# Patient Record
Sex: Male | Born: 1935 | Race: White | Hispanic: No | Marital: Married | Smoking: Former smoker
Health system: Southern US, Community
[De-identification: ages and names within clinical notes are randomized; demographics above are authoritative.]

## PROBLEM LIST (undated history)

## (undated) DIAGNOSIS — K5790 Diverticulosis of intestine, part unspecified, without perforation or abscess without bleeding: Secondary | ICD-10-CM

## (undated) DIAGNOSIS — N400 Enlarged prostate without lower urinary tract symptoms: Secondary | ICD-10-CM

## (undated) DIAGNOSIS — F32A Depression, unspecified: Secondary | ICD-10-CM

## (undated) DIAGNOSIS — I2510 Atherosclerotic heart disease of native coronary artery without angina pectoris: Secondary | ICD-10-CM

## (undated) DIAGNOSIS — N289 Disorder of kidney and ureter, unspecified: Secondary | ICD-10-CM

## (undated) DIAGNOSIS — N139 Obstructive and reflux uropathy, unspecified: Secondary | ICD-10-CM

## (undated) DIAGNOSIS — E119 Type 2 diabetes mellitus without complications: Secondary | ICD-10-CM

## (undated) DIAGNOSIS — K859 Acute pancreatitis without necrosis or infection, unspecified: Secondary | ICD-10-CM

## (undated) DIAGNOSIS — A4152 Sepsis due to Pseudomonas: Secondary | ICD-10-CM

## (undated) DIAGNOSIS — C61 Malignant neoplasm of prostate: Secondary | ICD-10-CM

## (undated) DIAGNOSIS — D649 Anemia, unspecified: Secondary | ICD-10-CM

## (undated) DIAGNOSIS — K222 Esophageal obstruction: Secondary | ICD-10-CM

## (undated) DIAGNOSIS — I10 Essential (primary) hypertension: Secondary | ICD-10-CM

## (undated) DIAGNOSIS — F329 Major depressive disorder, single episode, unspecified: Secondary | ICD-10-CM

## (undated) DIAGNOSIS — K449 Diaphragmatic hernia without obstruction or gangrene: Secondary | ICD-10-CM

## (undated) DIAGNOSIS — I714 Abdominal aortic aneurysm, without rupture, unspecified: Secondary | ICD-10-CM

## (undated) DIAGNOSIS — J45909 Unspecified asthma, uncomplicated: Secondary | ICD-10-CM

## (undated) DIAGNOSIS — I219 Acute myocardial infarction, unspecified: Secondary | ICD-10-CM

## (undated) DIAGNOSIS — E7800 Pure hypercholesterolemia, unspecified: Secondary | ICD-10-CM

## (undated) DIAGNOSIS — K589 Irritable bowel syndrome without diarrhea: Secondary | ICD-10-CM

## (undated) DIAGNOSIS — E039 Hypothyroidism, unspecified: Secondary | ICD-10-CM

## (undated) DIAGNOSIS — K219 Gastro-esophageal reflux disease without esophagitis: Secondary | ICD-10-CM

## (undated) DIAGNOSIS — E538 Deficiency of other specified B group vitamins: Secondary | ICD-10-CM

## (undated) DIAGNOSIS — M1990 Unspecified osteoarthritis, unspecified site: Secondary | ICD-10-CM

## (undated) DIAGNOSIS — K2970 Gastritis, unspecified, without bleeding: Secondary | ICD-10-CM

## (undated) HISTORY — DX: Disorder of kidney and ureter, unspecified: N28.9

## (undated) HISTORY — DX: Abdominal aortic aneurysm, without rupture: I71.4

## (undated) HISTORY — DX: Sepsis due to Pseudomonas: A41.52

## (undated) HISTORY — DX: Acute myocardial infarction, unspecified: I21.9

## (undated) HISTORY — DX: Acute pancreatitis without necrosis or infection, unspecified: K85.90

## (undated) HISTORY — DX: Benign prostatic hyperplasia without lower urinary tract symptoms: N40.0

## (undated) HISTORY — DX: Gastritis, unspecified, without bleeding: K29.70

## (undated) HISTORY — DX: Hypothyroidism, unspecified: E03.9

## (undated) HISTORY — DX: Unspecified osteoarthritis, unspecified site: M19.90

## (undated) HISTORY — DX: Malignant neoplasm of prostate: C61

## (undated) HISTORY — DX: Diverticulosis of intestine, part unspecified, without perforation or abscess without bleeding: K57.90

## (undated) HISTORY — DX: Gastro-esophageal reflux disease without esophagitis: K21.9

## (undated) HISTORY — DX: Atherosclerotic heart disease of native coronary artery without angina pectoris: I25.10

## (undated) HISTORY — DX: Major depressive disorder, single episode, unspecified: F32.9

## (undated) HISTORY — DX: Depression, unspecified: F32.A

## (undated) HISTORY — DX: Irritable bowel syndrome, unspecified: K58.9

## (undated) HISTORY — DX: Essential (primary) hypertension: I10

## (undated) HISTORY — DX: Abdominal aortic aneurysm, without rupture, unspecified: I71.40

## (undated) HISTORY — DX: Type 2 diabetes mellitus without complications: E11.9

## (undated) HISTORY — PX: CORONARY ANGIOPLASTY WITH STENT PLACEMENT: SHX49

## (undated) HISTORY — DX: Diaphragmatic hernia without obstruction or gangrene: K44.9

## (undated) HISTORY — DX: Obstructive and reflux uropathy, unspecified: N13.9

## (undated) HISTORY — DX: Pure hypercholesterolemia, unspecified: E78.00

## (undated) HISTORY — DX: Anemia, unspecified: D64.9

## (undated) HISTORY — DX: Esophageal obstruction: K22.2

## (undated) HISTORY — DX: Deficiency of other specified B group vitamins: E53.8

---

## 1998-09-22 ENCOUNTER — Ambulatory Visit (HOSPITAL_COMMUNITY): Admission: RE | Admit: 1998-09-22 | Discharge: 1998-09-22 | Payer: Self-pay | Admitting: Orthopaedic Surgery

## 1998-11-08 ENCOUNTER — Ambulatory Visit (HOSPITAL_COMMUNITY): Admission: RE | Admit: 1998-11-08 | Discharge: 1998-11-08 | Payer: Self-pay | Admitting: Oncology

## 1998-11-08 ENCOUNTER — Encounter (HOSPITAL_COMMUNITY): Payer: Self-pay | Admitting: Oncology

## 2003-12-09 ENCOUNTER — Ambulatory Visit: Payer: Self-pay | Admitting: Gastroenterology

## 2003-12-13 ENCOUNTER — Ambulatory Visit: Payer: Self-pay | Admitting: Gastroenterology

## 2004-01-05 ENCOUNTER — Ambulatory Visit (HOSPITAL_COMMUNITY): Admission: RE | Admit: 2004-01-05 | Discharge: 2004-01-05 | Payer: Self-pay | Admitting: Internal Medicine

## 2004-01-06 ENCOUNTER — Ambulatory Visit: Payer: Self-pay | Admitting: Gastroenterology

## 2004-01-13 ENCOUNTER — Ambulatory Visit: Payer: Self-pay | Admitting: Gastroenterology

## 2004-01-20 ENCOUNTER — Ambulatory Visit: Payer: Self-pay | Admitting: Gastroenterology

## 2005-07-10 ENCOUNTER — Ambulatory Visit: Payer: Self-pay | Admitting: Gastroenterology

## 2005-07-11 ENCOUNTER — Ambulatory Visit: Payer: Self-pay | Admitting: Gastroenterology

## 2005-07-16 ENCOUNTER — Emergency Department (HOSPITAL_COMMUNITY): Admission: EM | Admit: 2005-07-16 | Discharge: 2005-07-16 | Payer: Self-pay | Admitting: Emergency Medicine

## 2005-07-20 ENCOUNTER — Ambulatory Visit: Payer: Self-pay | Admitting: Emergency Medicine

## 2005-08-07 ENCOUNTER — Ambulatory Visit: Payer: Self-pay | Admitting: Internal Medicine

## 2007-04-02 ENCOUNTER — Inpatient Hospital Stay (HOSPITAL_COMMUNITY): Admission: EM | Admit: 2007-04-02 | Discharge: 2007-04-05 | Payer: Self-pay | Admitting: Emergency Medicine

## 2007-04-08 ENCOUNTER — Encounter: Admission: RE | Admit: 2007-04-08 | Discharge: 2007-04-08 | Payer: Self-pay | Admitting: Cardiology

## 2007-08-07 ENCOUNTER — Inpatient Hospital Stay (HOSPITAL_COMMUNITY): Admission: EM | Admit: 2007-08-07 | Discharge: 2007-08-13 | Payer: Self-pay | Admitting: Emergency Medicine

## 2008-05-18 ENCOUNTER — Ambulatory Visit (HOSPITAL_COMMUNITY): Admission: RE | Admit: 2008-05-18 | Discharge: 2008-05-18 | Payer: Self-pay | Admitting: Urology

## 2008-07-12 ENCOUNTER — Encounter: Admission: RE | Admit: 2008-07-12 | Discharge: 2008-07-12 | Payer: Self-pay | Admitting: Internal Medicine

## 2008-10-01 ENCOUNTER — Encounter: Admission: RE | Admit: 2008-10-01 | Discharge: 2008-10-01 | Payer: Self-pay | Admitting: Internal Medicine

## 2010-02-13 ENCOUNTER — Encounter: Payer: Self-pay | Admitting: Internal Medicine

## 2010-06-06 NOTE — H&P (Signed)
NAME:  CLAUD, GOWAN NO.:  [id number]   MEDICAL RECORD NO.:  [id number]          PATIENT TYPE:  INP   LOCATION:  2034                         FACILITY:  [facility]   PHYSICIAN:  Jake Bathe, MD      DATE OF BIRTH:  12-12-1930   DATE OF ADMISSION:  04/02/2007  DATE OF DISCHARGE:                              HISTORY & PHYSICAL   PRIMARY CARE PHYSICIAN:  Juline Patch, M.D. at [facility].   CHIEF COMPLAINT:  Chest pain consistent with non ST elevation MI.   HISTORY OF PRESENT ILLNESS:  A 77 year old male with no prior history of  coronary artery disease with prior hyperlipidemia; remote tobacco  history, quit 22 years ago with early family history of myocardial  infarction with his father having a MI in his 54s, who tonight after  dinner eating soup, walked over to his television, and noted a burning  in his throat with radiation to his left chest wall with no associated  shortness of breath, nausea, vomiting, but quite significant burning,  discomfort, and pressure.  Lasted approximately half hour, took Rolaids  and Alka-Seltzer, no change.  Called EMS who promptly gave him aspirin  and sent him to [facility] ER for further evaluation.  Here in the  emergency room, he was noted to have marked T-wave inversion in the  precordial leads which are different from his previous EKG.  No ST  elevations.  Currently, he is chest pain free with no shortness of  breath.  Approximately 1 week prior to this episode, he had  a similar  shorter episode lasting approximately 10 minutes in duration.  He is  here accompanied by his wife.   PAST MEDICAL HISTORY:  1. 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  2. Osteoarthritis.  3. Hyperlipidemia, on no meds.  4. Previous right hand surgery.   ALLERGIES:  CODEINE and DEMEROL.   MEDICATIONS:  He takes oxycodone at night for his osteoarthritis,  vitamins, aspirin 325 mg every now and then and is on no  statin  medication.   SOCIAL HISTORY:  Quit tobacco use 25 years ago.  No alcohol use.  He  worked for the [employer], Insurance account manager  roads.  He has been married for 50 years.  His wife's sister was treated  previously by Dr. Tresa Endo.   FAMILY HISTORY:  Father had MI in his 81s.   REVIEW OF SYSTEMS:  No bleeding problems.  No syncope.  No palpitations.  As above.  Unless specified above all other 12-reveiw of systems  negative.   PHYSICAL EXAMINATION:  Pulse currently 77, saturating 97% on room air,  and blood pressure 145/87 to 158/77, originally 170/87.  Respiration 16.  GENERAL:  Alert and oriented x3 in no acute distress, lying in bed,  accompanied by his wife.  EYE:  Well-perfused conjunctivae.  EOMI.  No scleral icterus.  NECK:  No carotid bruits.  No JVD.  Moist mucous membranes.  CARDIOVASCULAR:  Regular rate and rhythm.  Distant heart sounds.  No  murmurs, rubs, or gallops.  LUNGS:  Clear to  auscultation bilaterally.  Mild crackles, resolve with  coughing left lower lobe.  Normal respiratory effort.  ABDOMEN:  Soft and nontender.  Normoactive bowel sounds.  No bruits.  EXTREMITIES:  No clubbing, cyanosis, or edema.  A 2+ femoral pulse.  NEUROLOGIC:  Nonfocal.  No tremors.  SKIN:  Warm, dry, and intact.  No rashes noted.   DATA:  EKG shows marked T-wave inversion suggestive of ischemia in all  of the precordial leads and in lead I and aVL as well as lead II.  No  evidence of ST elevation.  First EKG was demonstrated some biphasic T  waves in V3.  When compared to all these EKG from December 2005, T-wave  abnormalities are not present.  Chest x-ray personally viewed showed no  acute airspace disease.  Nuclear stress test from December 2005, showed  normal ejection fraction of 58% with no ischemia.  Cardiac enzymes point-  of-care marker showed myoglobin greater than 500, MB of 28, troponin of  1.1.  Sodium 137, potassium 4.2, BUN 8, creatinine  1.4, and glucose 309.  White count 6.2, hemoglobin 13, hematocrit 37, and platelets 196,000.  Lipase 22.  Prior medical records reviewed.   ASSESSMENT/PLAN:  A 77 year old male with hyperlipidemia, hyperglycemia  with non-ST elevation myocardial infarction, positive troponin, and T  wave inversions indicative of ischemia on EKG.  1. Non-ST-elevation myocardial infarction - aspirin, beta-blocker,      heparin IV, nitroglycerin p.r.n., serial biomarkers checking EKG in      a.m., and n.p.o.  Plan cardiac catheterization in morning.      Possible percutaneous intervention.  If changes occur overnight,      may sent to catheterization lab emergently.  2. Hyperglycemia - placed on insulin sliding scale, check hemoglobin      A1c.  3. Check fasting lipid profile.  4. Chronic renal insufficiency - creatinine is mildly elevated at 1.4.      We will give Mucomyst and hydrate.  In regards to cardiac      catheterization, he is aware of risks and benefits especially      stroke, heart attack, and  death.      Jake Bathe, MD  Electronically Signed     MCS/MEDQ  D:  04/02/2007  T:  04/03/2007  Job:  [id number]   cc:   Juline Patch, M.D.

## 2010-06-06 NOTE — Cardiovascular Report (Signed)
NAME:  Paul Odonnell, Paul Odonnell NO.:  [id number]   MEDICAL RECORD NO.:  [id number]          PATIENT TYPE:  INP   LOCATION:  2916                         FACILITY:  [facility]   PHYSICIAN:  Lyn Records, M.D.   DATE OF BIRTH:  11-26-1930   DATE OF PROCEDURE:  04/03/2007  DATE OF DISCHARGE:                            CARDIAC CATHETERIZATION   REFERRING PHYSICIAN:  Jake Bathe, MD and Juline Patch, M.D. at  [facility].   INDICATION:  Non-ST-elevation myocardial infarction.   PROCEDURE:  Drug-eluting  Promus stent, mid-LAD.   DESCRIPTION:  Following diagnostic procedure by Dr. Anne Fu, a 6-French  XP-4 guide catheter was inserted and guiding shots obtained.  A Prowater  guidewire was initially attempted to be placed down the vessel but with  difficulty.  We then used a BMW wire to obtain stability within the  vessel and used a fresh Asahi Prowater guidewire to get to the distal  LAD.  We then removed the BMW wire and used a 2.5 x 12-mm long Maverick  balloon.  After predilatation, we properly placed and deployed an 18-mm  long  Promus 0.275 stent.  Two balloon inflations at 10 atmospheres were  performed.  I chose a stent smaller than the largest portion of this  segmental stenosis because I was concerned about producing dissection in  the proximal margin.  After stent deployment, we then used a 3.0 x 15  Quantum within both stent margins up to 15 atmospheres.  I still felt  that the distal margin was not well expanded and we used a 0.325 x 10-mm  Dura Star.  One balloon inflation to 15 atmospheres was performed.  After reviewing flow, the wire was removed and  final images were  obtained.  There is clear disparity in the vessel diameter with the  proximal vessel being a little smaller than 3.0 in diameter.  There is a  clear step-up there.  Distally, within an acute margin  just after the  culprit lesion,  the vessel balloons to 3.3 to 3.5 in diameter.  We  used  a 0.325 high-pressure inflation within the distal stent margin to try to  properly oppose the stent without being overly aggressive in causing  dissection.  TIMI grade 3 flow was noted, less than 10% stenosis was  noted within the stent margin before the distal stent.  Proximally, in  one view, there is a suggestion of very focal region of intimal  disruption, but no dye hang up and a large lumen.  No further ballooning  or stenting was done in this region.   The patient was on  Integrilin when brought to the lab.  He was loaded  with Plavix 600 mg and was given weight-based heparin.  ACT was greater  than 300 during the procedure.  Manual compression will be used for  hemostasis.   CONCLUSION:  Successful drug-eluting stent, mid left anterior  descending, 99% stenosis to 0%.  There is vessel diameter disparity with  the proximal segment being smaller than the distal stent margin which is  right  in an  acute bend.  The final result is reasonable.  I would  recommend that the patient remain on aspirin and Plavix combination for  at least 24 months.   PLAN:  Discontinue  Integrilin after 18 hours.  Further management per  Dr. Anne Fu.     Lyn Records, M.D.  Electronically Signed    HWS/MEDQ  D:  04/03/2007  T:  04/04/2007  Job:  [id number]

## 2010-06-06 NOTE — Cardiovascular Report (Signed)
NAME:  Paul Odonnell, Paul Odonnell NO.:  [id number]   MEDICAL RECORD NO.:  [id number]          PATIENT TYPE:  INP   LOCATION:  2916                         FACILITY:  [facility]   PHYSICIAN:  Jake Bathe, MD      DATE OF BIRTH:  07/19/1930   DATE OF PROCEDURE:  04/03/2007  DATE OF DISCHARGE:                            CARDIAC CATHETERIZATION   PROCEDURES:  1. Left heart catheterization.  2. Selective coronary angiography.  3. Left ventriculogram.   INDICATIONS:  A 77 year old male with non-ST elevation MI.   PROCEDURE IN DETAILS:  Informed consent was obtained.  Risk of stroke,  heart attack, death, and renal impairment were explained to the patient  and his wife.  He was placed on the catheterization table, prepped in a  sterile fashion.  Using the modified Seldinger technique, a 6-French  sheath was placed in the right femoral artery after visualizing the  femoral head under fluoroscopic guidance.  A Judkins right #4 catheter  was then used to selectively cannulate the right coronary artery and  multiple views of Omnipaque were obtained.  This catheter was then  exchanged for Judkins left #4 catheter to selectively cannulate the left  main artery and multiple views of Omnipaque were obtained.  This  catheter was exchanged for anangled pigtail which was used to cross the  aortic valve into the left ventricle.  Hemodynamics were obtained.  In  the RAO position, a left ventriculogram was performed with power  injection of 30 mL of dye.  Aortic valve pullback was then performed.  After procedure, findings were discussed with Dr. Katrinka Blazing who proceeded  with drug-eluting stent placement to LAD.   FINDINGS:  1. Left main artery - No disease.  Branches into circumflex and LAD.  2. Left anterior descending artery - This vessel has 2 diagonals, the      first of which is moderate in size and has a 75% ostial stenosis as      well as a 50% sequential proximal stenosis.  Distal to  this      diagonal branch, there is a diffusely diseased segment of the LAD,      50% to 60% which then culminates with a 99% stenosis with hazy      opacity suggestive of thrombus.  The LAD then continues to give off      a second diagonal branch and continues to wrap around the apex.      Distally, the LAD has some diffuse disease in the 50% range before      it reaches the apex.  The second diagonal is small in caliber.      There is proximal calcification of the LAD.  3. There is a moderate sized high OM/ramus branch with mild      irregularities.  4. Circumflex.  The remaining circumflex artery is moderate in size,      small AV groove circumflex, small obtuse marginal with no disease.  5. Right coronary artery - The right coronary artery has a 50%      proximal lesion, quite tortuous.  There  are several posterolateral      branches.  PDA arises from this artery, this is the dominant      vessel.  After the first posterolateral branch, there is a 50%      stenosis in the continuation of the large caliber right coronary      artery.  There is also some minor irregularity distally in this      artery.  6. Left ventriculogram, ejection fraction approximately 45% to 50%.      There is distal anterior wall and distal inferior wall      akinesis/dyskinesis.  No thrombus.  7. Hemodynamics.  Left ventricle pressure is 144 systolic with an      LVEDP of 9 mmHg.  The aortic pressure was 144/69 with a mean of 98      mmHg.  There was no gradient.   IMPRESSION:  1. A 99% mid left anterior descending artery stenosis with moderate-      sized length of disease proximally to this.  In addition, moderate      disease in the right coronary artery 50% proximal and distal.  2. Left ventricular ejection fraction 45% to 50% with distal anterior      and distal inferior apical wall akinesis/dyskinesis.  Normal left      ventricular end-diastolic pressure.   RECOMMENDATIONS:  Discussed the films with  Dr. Katrinka Blazing who will perform  drug-eluting stent placement to mid LAD segment.      Jake Bathe, MD  Electronically Signed     MCS/MEDQ  D:  04/03/2007  T:  04/04/2007  Job:  [id number]   cc:   Juline Patch, M.D.

## 2010-06-06 NOTE — Discharge Summary (Signed)
NAME:  Paul Odonnell, Paul Odonnell NO.:  [id number]   MEDICAL RECORD NO.:  [id number]          PATIENT TYPE:  INP   LOCATION:  1410                         [facility]ACILITY:  [facility]   PHYSICIAN:  Herbie Saxon, MDDATE OF BIRTH:  17-Oct-1930   DATE OF ADMISSION:  08/07/2007  DATE OF DISCHARGE:                               DISCHARGE SUMMARY   DATE OF DISCHARGE:  To be determined.   Medications to be dictated on final discharge.   DISCHARGE DIAGNOSES:  1. Sepsis.  2. Left lower lobe pneumonia.  3. Pseudomonas pyelonephritis.  4. Colitis.  5. Diabetes.  6. Hypokalemia supplementation.  7. Anemia of chronic disease.  8. Small pulmonary nodule.  9. Thrombocytopenia.  10.History of coronary artery disease, status post stent.   PRIMARY CARE PHYSICIAN:  Dr. Ricki Miller.   CARDIOLOGY:  Dr. Graciela Husbands.   RADIOLOGY:  A CT of the abdomen and pelvis on August 08, 2007 showed wall  thickening along the distal distending colon and rectosigmoid colon  showed evidence of colitis, suspicious  findings of bilateral  perinephric spot, possibly pyelonephritis.  No evidence of bowel  obstruction or intra-abdominal abscess, and there is at the 4-mm  pulmonary nodule in the right middle lobe.  Repeat CT chest recommended  within the year.  Chest x-ray of August 07, 2007 to the bottom lobe of the  left lung base with peribronchovascular thickening, query bronchial  pneumonia as reason for this.   HOSPITAL COURSE:  This 77 year old male presented to the emergency room  with 2 days of fever, chills, dysuria, urinary frequency, cough,  nonproductive.  He saw his primary physician the day prior to  presentation because of difficulty voiding completely.  He had a high-  grade fever with a temperature of 105.4.  The patient was started on IV  Rocephin and Azithromycin.  The urine culture came back positive for  pseudomonas.  The antibiotic coveragehas been changed to IV Cipro and  p.o. Flagyl.  Abdominal pain  is much reduced .Afebrile in the last 48  hours.  The leukocytosisis resolved.  Hemoccult was negative, but he had  to be transfused 2 units of packed red blood cells on August 10, 2007.  The hematocrit was 31.  Prior to transfusion hematocrit was 24.  The  patient is going to have a bladder scan today and reinsertion of his  Foley catheterization.  Will consider urology evaluation as needed.   PHYSICAL EXAMINATION:  GENERAL:  On examination today he is an elderly  man not in acute distress.  VITAL SIGNS:  Temperature 98, pulse 105, respiratory rate is 18, blood  pressure 107/32.  HEENT:  Pale, not jaundiced.  Head is atraumatic, normocephalic.  Mucous  membranes are moist.  NECK:  Supple.  CHEST:  Clear.  HEART:  Heart sounds 1 and 2, regular, tachycardia.  ABDOMEN:  Benign.  PSYCH:  He is oriented to time, place, and person.  EXTREMITIES:  Peripheral pulses present, no pedal edema.  Power is 4+  globally.  Cranial nerves II-XII are intact.   LABS:  WBC 5.7, hematocrit 31, platelet count 147,  glucose is 78.  The  sodium level is 142, chloride 108, bicarbonate 27, BUN 11, creatinine  1.4.   PLAN:  The plan is to follow up result of blood scan, obtain a PSA level  now.  Will continue urology workup as an outpatient with Dr. Vonita Moss.  Also monitor potassium level.  The patient has had medication therapy  and treatment plan explained to him and his wife did verbalize  understanding.PSA elevated.12.he will need possible prostate biopsy   Final discharge note to be dictated.      Herbie Saxon, MD  Electronically Signed     MIO/MEDQ  D:  08/11/2007  T:  08/11/2007  Job:  [id number]

## 2010-06-06 NOTE — Discharge Summary (Signed)
NAME:  Paul Odonnell, NEEDLE NO.:  [id number]   MEDICAL RECORD NO.:  [id number]          PATIENT TYPE:  INP   LOCATION:  1410                         [facility]AC[facility]   PHYSICIAN:  Hind I Elsaid, MD      DATE OF BIRTH:  1930/04/03   DATE OF ADMISSION:  08/07/2007  DATE OF DISCHARGE:  08/13/2007                               DISCHARGE SUMMARY   PRIMARY CARE PHYSICIAN:  Juline Patch, M.D.   DISCHARGE DIAGNOSES:  1. Pseudomonas pyelonephritis.  2. Left lower lobe pneumonia.  3. Elevated prostatic specific antigen.  4. History of diabetes mellitus type 2.  5. Anemia of chronic disease.  6. Small pulmonary nodule.  7. History of myocardial infarction status post stent.   MEDICATIONS:  1. OxyContin 40 mg nightly.  2. Bisoprolol 5  mg daily.  3. Protonix 20 mg daily.  4. Lasix 10 mg.  5. Plavix 75 mg daily.  6. Aspirin 81 mg daily.  7. Lipitor 40 mg daily.  8. Metformin 500 mg twice daily.  9. Metoprolol 40 mg daily.  The patient is to continue his home      medication.  10.Cipro 500 mg q.12 hours for 5 days.  11.Flomax 0.4 mg twice daily.   CONSULTATIONS:  None.   FOLLOWUP:  Appointment given to see Maretta Bees. Vonita Moss, M.D. on August 6,  at 2:15 p.m. for evaluation of high prostatic specific antigen.   HOSPITAL COURSE:  Problem 1.  This is an add-on to the discharge summary  done by Herbie Saxon, M.D.  The patient had a bladder scan  without evidence of any urinary retention.  The patient has a history of  prostate hypertrophy with workup in the past with Dr. Vonita Moss.  At this  time the patient denies any obstruction and denies any urinary  retention.  The patient will receive Flomax 0.4 mg twice daily and will  ask to continue Cipro for another 5 days.  The patient needs to follow  with Dr. Vonita Moss for evaluation of elevated PSA.   Problem 2.  Anemia.  Hemoglobin remained stable.   Problem 3.  Left lower lobe pneumonia.  Chest x-ray shows  significant  improvement and the patient did not require any form of oxygenation.  The patient will be discharged with Cipro and the patient informed about  the CT scan of his chest results and recommendation to follow up other  CT scan within 1 year.  The patient denies history of smoking.   DISPOSITION:  It was felt that the patient was medically stable to be  discharged home to follow with his primary care, Dr. Ricki Miller, and Dr.  Vonita Moss.      Hind Bosie Helper, MD  Electronically Signed     HIE/MEDQ  D:  08/13/2007  T:  08/13/2007  Job:  [id number]   cc:   Juline Patch, M.D.  Fax: [phone]

## 2010-06-06 NOTE — H&P (Signed)
NAME:  Paul Odonnell, Paul Odonnell NO.:  [id number]   MEDICAL RECORD NO.:  [id number]          PATIENT TYPE:  INP   LOCATION:  0113                         FACILITY:  [facility]   PHYSICIAN:  Herbie Saxon, MDDATE OF BIRTH:  Apr 10, 1930   DATE OF ADMISSION:  08/07/2007  DATE OF DISCHARGE:                              HISTORY & PHYSICAL   He is a full code.  He has no health care proxy assigned.   PRIMARY CARE PHYSICIAN:  Juline Patch, M.D.   PRESENTING COMPLAINT:  A 2-day history of fever, chills, urinary  frequency, dysuria, cough.   HISTORY OF PRESENTING COMPLAINTS:  This 77 year old male was well until  yesterday morning when he started experiencing high grade fever and  chills, dysuria, minimally productive cough, urinary frequency and  urgency.  He presented to his primary care physician yesterday and was  subsequently transferred to the emergency room for suspicion of  prostatitis.  Temperature was 105.4.  The patient denies any hematuria.  He denies any skin rash or joint swelling.  He denies any headache or  neck stiffness.  There is no runny nose, no sore throat. He denies any  wheezing.  He denies any diarrhea, hematemesis or melena in the stools.  There is also no hematuria.   PAST MEDICAL HISTORY:  MI, status post drug-eluting cardiac stenting and  status post cardiac catheterization in March 2009, which was performed  by the cardiologist, Dr. Gennette Pac.   PAST SURGICAL HISTORY:  1. Right hand surgery years ago.  2. Cardiac catheterization in March 2009.   FAMILY HISTORY:  Father and 2 brothers with myocardial infarctions.   SOCIAL HISTORY:  He is married, retired Art gallery manager.  He quit smoking  cigarettes 25 years ago.  No documented history of drug or alcohol  abuse.   REVIEW OF SYSTEMS:  12 systems were reviewed.  Pertinent positives as in  the History of Presenting Complaints.   ALLERGIES:  CODEINE and DEMEROL.   MEDICATIONS:  1. OxyContin 40 mg  q.h.s.  2. Bisodol 5 mg daily.  3. Protonix 20 mg daily.  4. Lasix 10 mg daily.  5. Imdur 30mg  daily  6. Potassium p.r.n.  7. klonopin 0.5 mg q.h.s.  8. Creon 5 tablets daily.  9. Metformin 500 mg daily.  10.Metoprolol 50 mg b.i.d.  11.Aspirin 325 mg daily.  12.Lipitor 40 mg daily.  13.Lisinopril 10 mg daily  14.Plavix 5 mg daily.   PHYSICAL EXAMINATION:  He is an elderly man, acutely ill looking, not in  respiratory distress.  He is pale, not jaundiced.  There is no cyanosis.  I am seeing no clubbing.  Temperature is 103, pulse 102, respirations 20, blood pressure 128/57.  Pupils aer equal, reactive to light and accommodation.  Extraocular  muscles intact.  Mucous membranes are dry.  The oropharynx and the  pharynx are clear.  There is no submandibular lymphadenopathy.  No elevated JVD.  Neck is  supple.  CHEST:  He has coarse rales in the left base.  No chest wall tenderness,  no wheezing.  PMI at the 5th intercostal space at  the midclavicular line.  There is no  parasternal heave, murmurs or gallops.  Heart sounds 1 and 2,  tachycardic, regular.  ABDOMEN:  Soft, nontender, no organomegaly.  Inguinal orifices are  patent.  He is alert and oriented to time, place and person.  Mood is stable.  Speech is  normal.  Cranial nerves II-XII intact.  Power is 5 all limbs.  Deep tendon reflexes are 2+ all limbs.  Sensory system normal.  Peripheral pulse is present.  No pedal edema.   LAB TESTS:  Chemistry:  Sodium is 135, potassium 4.0, chloride 101, BUN  27, creatinine 1.8, glucose is 159.  Complete blood count:  WBC is 9.2,  hematocrit 29, platelet count is 121.  Urinalysis:  Appearance is  cloudy.  Leukocyte esterase large, WBCs 21-50, bacteria rare.  Chest x-  ray shows left lower lobe volume loss and peribronchial vascular  opacity.   ASSESSMENT:  1. Left lower lobe pneumonia.  2. Urinary tract infection.  3. Dehydration.  4. Anemia.  5. Uncontrolled diabetes.  6.  History of myocardial infarction, status post stenting.   PLAN:  1. The patient will get admitted to a telemetry bed.  2. He will be on bedrest and droplet precautions.  3. We will send nasal swab for H1N1 virus.  4. We will obtain an ESR.  5. Oxygen and room air.  6. Mycoplasma serology.  7. Urine Legionella antigen assay x1.  8. We will get serial cardiac enzymes and  EKG q. 8 hours times 3.  9. We will start him on IV Rocephin and Zithromax.  10.We will also obtain a urinalysis, culture and blood cultures times      2.  11.We will obtain his coagulation parameters.  12.He should be on supplemental oxygen 2-4 liters  by nasal cannula  13.Diet should be renal,low cholesterol.  14.We will hold his Lasix and metformin.  We will continue with his      other medications and reduce the dose of his Lisinopril to 5 mg      daily. We will reduce the dose of his OxyContin to 20 mg daily.  15.He is to be on Lovenox 30 mg subcu daily for deep venous thrombosis      prophylaxis and 40 mg IV daily for also prophylaxis.  16.Will put on Atrovent q.6 hours standing and put on Atrovent 1 unit      dose q. 3 hours p.r.n.  17.Tylenol 600  mg q. 4 hours p.r.n. for fever.  18.We will continue to monitor  the renal function      Herbie Saxon, MD  Electronically Signed     MIO/MEDQ  D:  08/07/2007  T:  08/07/2007  Job:  [id number]   cc:   Juline Patch, M.D.  Fax: [phone]   Dr. Gennette Pac

## 2010-06-09 NOTE — Discharge Summary (Signed)
NAME:  Paul Odonnell, TRICE NO.:  [id number]   MEDICAL RECORD NO.:  [id number]          PATIENT TYPE:  INP   LOCATION:  4711                         FACILITY:  [facility]   PHYSICIAN:  Jake Bathe, MD      DATE OF BIRTH:  1931/01/21   DATE OF ADMISSION:  04/02/2007  DATE OF DISCHARGE:  04/05/2007                               DISCHARGE SUMMARY   DISCHARGE DIAGNOSES:  1. Non-ST-segment elevated myocardial infarction with a maximum      troponin of 37.94.  2. Dyslipidemia, treated.  3. Hypertension, treated.  4. Diabetes mellitus, treated.  5. Left ventricular systolic dysfunction, EF 45%-50%, medically      managed.   HOSPITAL COURSE:  [name] is a 77 year old male patient who is  admitted to [facility] of acute left anterior chest wall pain.  He  took Rolaids and Alka-Seltzer with no change.  The pain lasted around  half an hour, so he presented to the emergency room.  In emergency room,  he felt better.  Unfortunately, he did rule in for a non-ST-segment  elevated myocardial infarction where his EKG remained normal.   The following day, he underwent cardiac catheterization and was found to  have an LAD, first diagonal 75% ostial lesion with 99% mid LAD lesion at  80% distal apical lesion.  Circumflex is smaller than the residual  disease.  Ramus is normal in size and nothing more than luminar  irregularities.  Right coronary artery had 50% proximal lesion followed  by 20% distal lesion.  LV gram showed an EF of 45%-50% with distal  akinesis and inferior akinesis, EEP 9.  Thereafter, the patient then  underwent drug-eluting stent placement to the LAD without difficulty.   The patient remained in the hospital over the next several days and was  ready for discharge to home on April 05, 2007.   DISCHARGE LABS:  White count 7.1, hemoglobin 12.4, hematocrit 35.8, and  platelets 159.  Sodium 140, potassium 3.7, BUN 13, creatinine 1.39,  hemoglobin A1c 9.5.   Total CK 947, 133 of MB fraction, maximum troponin  is 37.94.  Total cholesterol 246, triglycerides 154, HDL 27, and LDL  186.   DISCHARGE MEDICATIONS:  1. Plavix 75 mg a day.  2. Enteric coated aspirin 325 mg a day.  3. Metoprolol 50 mg twice a day.  4. Lisinopril 20 mg day.  5. Actos 15 mg a day.  6. Lipitor 40 mg a day.   Increase activities slowly.  No lifting over 10 pounds. For 1 week.  Follow up to see Dr. Anne Fu on April 18, 2007, at 1:15 p.m.      Guy Franco, P.A.      Jake Bathe, MD  Electronically Signed    LB/MEDQ  D:  05/28/2007  T:  05/29/2007  Job:  [id number]

## 2010-06-09 NOTE — Discharge Summary (Signed)
NAME:  Paul, Odonnell NO.:  [id number]   MEDICAL RECORD NO.:  [id number]          PATIENT TYPE:  INP   LOCATION:  4711                         FACILITY:  [facility]   PHYSICIAN:  Corky Crafts, MDDATE OF BIRTH:  Jun 25, 1930   DATE OF ADMISSION:  04/02/2007  DATE OF DISCHARGE:  04/05/2007                               DISCHARGE SUMMARY   DISCHARGE DIAGNOSES:  1. Non-ST-segment elevated myocardial infarction status post drug-      eluting stent to the left anterior descending.  2. Diabetes mellitus.  3. Hypertension.  4. Hyperlipidemia, treated.  5. Smoking cessation counseling.  6. Left ventricular systolic dysfunction, ejection fraction 45-50%.   HOSPITAL COURSE:  Paul Odonnell is a 77 year old male patient who was  admitted to [facility] on April 02, 2007, with substernal chest pain.  His cardiac markers were positive and were consistent with non-ST-  segment elevated myocardial infarction.  The patient was taken to the  cardiac catheterization lab on April 03, 2007, and was found to have  subtotal LAD stenosis.  All other vessels revealed nonobstructive  disease.  Dr. Katrinka Blazing then implanted the drug-eluting stent to the LAD  resolving the 99% stenosis to a less than 10% postprocedure with TIMI-3  flow.  The patient was maintained on Integrilin for approximately 18  hours with plans for Plavix and aspirin at least 18-24 months.   The patient was finally discharged to home on April 05, 2007, in stable  condition.   LAB STUDIES:  During the patient's hospitalization included hemoglobin  of 12.4, hematocrit of 35.8, platelets 159, and white count 7.1.  Sodium  140, potassium 3.7, BUN 13, and creatinine 1.39.  Hemoglobin A1c 9.5 for  which the patient needs to have titer control of his diabetes mellitus  and wanted to see his primary care physician.  Cardiac isoenzymes  maximize with CK of 9.7, MB fraction of 133, and troponin 37.94.  His  cholesterol panel  showed a total cholesterol of 246, triglycerides 164,  HDL of 27, and LDL of 186.   DISCHARGE INSTRUCTIONS:  Remain on a low-sodium, heart healthy diabetic  diet.  We will refer him to see Dr. Anne Fu on April 18, 2007, at 1:15  p.m.   DISCHARGE MEDICATIONS:  1. Plavix 75 mg a day.  2. Enteric-coated aspirin 325 mg a day.  3. Metoprolol 50 mg p.o. b.i.d.  4. Lisinopril 10 mg a day.  5. Actos 15 mg a day.  6. Lipitor 40 mg daily.      Paul Odonnell, P.A.      Corky Crafts, MD  Electronically Signed    LB/MEDQ  D:  05/13/2007  T:  05/14/2007  Job:  [id number]

## 2010-06-09 NOTE — H&P (Signed)
NAME:  LANGLEY, INGALLS NO.:  [id number]   MEDICAL RECORD NO.:  [id number]          PATIENT TYPE:  EMS   LOCATION:  MAJO                         FACILITY:  [facility]   PHYSICIAN:  Iva Boop, M.D. LHCDATE OF BIRTH:  03-24-1930   DATE OF ADMISSION:  07/16/2005  DATE OF DISCHARGE:                                HISTORY & PHYSICAL   CHIEF COMPLAINT:  Mouth pain after endoscopy and dilation.   HISTORY:  This is a 77 year old white male who underwent an  esophagogastroduodenoscopy and Maloney dilation of the esophagus on June  20th by Dr. Sheryn Bison. He had some reflux symptoms of dysphagia.  He  had some nodularity in the cardia and body. He had an empiric Maloney  dilation with a 56-French Maloney dilator performed. Since that time he  indicates that he has had this left-sided mouth and pain around his tongue.  He actually saw Dr. Ricki Miller, his primary care physician today, was given some  sort of a shot and Magic Mouthwash for it. I spoke to the patient and his  wife after I called our answering service and they described increasing  swelling and increasing pain in the left cheek area and down near the jaw  line. I had him come to the emergency department  where he was evaluated. He  has no respiratory difficulty and no lip swelling or chest pain or bleeding  problems.   MEDICATIONS:  Mobic, OxyContin, and aspirin.   ALLERGIES:  CODEINE.   In the past he has used Creon, but not for a number of years.   PAST MEDICAL HISTORY:  1.  Anemia.  2.  Osteoarthritis.  3.  Pancreatitis (chronic).  He had an unrevealing colonoscopy the other day      as well.   PHYSICAL EXAMINATION:  A pleasant, elderly white male in no acute distress.  Vital signs reveal a temperature 98.9,  blood pressure 209/124, pulse 93,  respirations 16. On examination, particularly when he is lying down, you can  see a small egg-size swelling in the left jaw line area, there is erythema  and  redness there. He has some teeth in the lower incisor line and canine  area, but he is edentulous in the molar areas. The gum itself is somewhat  inflamed. There is no purulent material. Upon closer inspection with the  help of Dr. Devoria Albe in the emergency department  I can see where he has a  whitish area that shows that it may be pointing up. There is no cervical or  supraclavicular adenopathy. Lungs are clear. There is no other lip swelling.   ASSESSMENT:  Looks like he has an oral or gum abscess. This could have been  related to the dilation or could have been present prior to that and  aggravated. He has received some sort of treatment today, but he is in acute  pain, hypertensive related to the pain I believe as there is no history of  hypertension in the past.   PLAN:  We attempted to open this up with a sterile needle, but there  was no  drainage at this point other than a small amount of blood, Dr. Lynelle Doctor kindly  performed this. The patient tolerated that well. Will go ahead and prescribe  Pen-VEE-K for the patient. He will need to see a dentist or oral surgeon. He  does not want to go back to his dentist that he used last year when he had a  large amount of dental work done.  I will make sure that we will follow up  with the  patient on this tomorrow and will also given him something for pain here and  follow up on his blood pressure.  I am presuming that is related to pain.   Of note we used Pen-VEE-K 500 mg q.i.d. for ten days.      Iva Boop, M.D. [facility]  Electronically Signed     CEG/MEDQ  D:  07/16/2005  T:  07/16/2005  Job:  [id number]   cc:   Vania Rea. Jarold Motto, M.D. [facility]  [address]  [place]  [place] [address]   Juline Patch, M.D.  Fax: [phone]

## 2010-10-16 LAB — LIPID PANEL
Cholesterol: 246 — ABNORMAL HIGH
LDL Cholesterol: 186 — ABNORMAL HIGH

## 2010-10-16 LAB — BASIC METABOLIC PANEL
BUN: 13
BUN: 8
BUN: 8
CO2: 31
Calcium: 9
Calcium: 9.3
Chloride: 104
GFR calc non Af Amer: 50 — ABNORMAL LOW
GFR calc non Af Amer: 52 — ABNORMAL LOW
GFR calc non Af Amer: 53 — ABNORMAL LOW
Glucose, Bld: 201 — ABNORMAL HIGH
Potassium: 3.7
Potassium: 3.8
Sodium: 140
Sodium: 140
Sodium: 142

## 2010-10-16 LAB — CBC
HCT: 35.3 — ABNORMAL LOW
HCT: 35.8 — ABNORMAL LOW
Hemoglobin: 12.4 — ABNORMAL LOW
Hemoglobin: 12.7 — ABNORMAL LOW
Hemoglobin: 13
MCHC: 34.9
MCHC: 35.1
MCV: 91.7
MCV: 92.2
Platelets: 159
Platelets: 159
Platelets: 164
RBC: 3.89 — ABNORMAL LOW
RBC: 4.06 — ABNORMAL LOW
RDW: 13.1
RDW: 13.2
WBC: 6.1
WBC: 6.2
WBC: 7.1

## 2010-10-16 LAB — I-STAT 8, (EC8 V) (CONVERTED LAB)
Acid-Base Excess: 3 — ABNORMAL HIGH
Chloride: 105
HCT: 39
Operator id: 146091
Potassium: 4.2
TCO2: 31
pCO2, Ven: 52.1 — ABNORMAL HIGH

## 2010-10-16 LAB — COMPREHENSIVE METABOLIC PANEL
ALT: 33
AST: 130 — ABNORMAL HIGH
Albumin: 3.8
Alkaline Phosphatase: 117
BUN: 8
Chloride: 102
GFR calc Af Amer: >60
Potassium: 4.3
Sodium: 140
Total Bilirubin: 0.6
Total Protein: 6.6

## 2010-10-16 LAB — HEPARIN LEVEL (UNFRACTIONATED): Heparin Unfractionated: <0.1 — ABNORMAL LOW

## 2010-10-16 LAB — POCT CARDIAC MARKERS
CKMB, poc: 28.1
Operator id: 146091
Troponin i, poc: 1.11

## 2010-10-16 LAB — B-NATRIURETIC PEPTIDE (CONVERTED LAB): Pro B Natriuretic peptide (BNP): 284 — ABNORMAL HIGH

## 2010-10-16 LAB — CK TOTAL AND CKMB (NOT AT ARMC)
CK, MB: 133.9 — ABNORMAL HIGH
CK, MB: 40.8 — ABNORMAL HIGH
CK, MB: 99.1 — ABNORMAL HIGH
Relative Index: 12 — ABNORMAL HIGH
Relative Index: 14.1 — ABNORMAL HIGH
Relative Index: 8.7 — ABNORMAL HIGH
Total CK: 824 — ABNORMAL HIGH

## 2010-10-16 LAB — TROPONIN I: Troponin I: 34.59

## 2010-10-16 LAB — POCT I-STAT CREATININE: Operator id: 146091

## 2010-10-16 LAB — APTT: aPTT: 87 — ABNORMAL HIGH

## 2010-10-20 LAB — URINALYSIS, ROUTINE W REFLEX MICROSCOPIC
Glucose, UA: NEGATIVE
Ketones, ur: NEGATIVE
Protein, ur: 30 — AB
pH: 6

## 2010-10-20 LAB — HEPATIC FUNCTION PANEL
Bilirubin, Direct: 0.2
Indirect Bilirubin: 0.5

## 2010-10-20 LAB — STOOL CULTURE

## 2010-10-20 LAB — CBC
HCT: 25.1 — ABNORMAL LOW
HCT: 25.6 — ABNORMAL LOW
HCT: 29.8 — ABNORMAL LOW
HCT: 30.1 — ABNORMAL LOW
Hemoglobin: 10.5 — ABNORMAL LOW
Hemoglobin: 10.5 — ABNORMAL LOW
MCHC: 34.9
MCHC: 34.9
MCHC: 35
MCHC: 35.3
MCV: 93.2
MCV: 93.3
MCV: 93.5
MCV: 93.6
Platelets: 101 — ABNORMAL LOW
Platelets: 109 — ABNORMAL LOW
Platelets: 116 — ABNORMAL LOW
Platelets: 121 — ABNORMAL LOW
Platelets: 147 — ABNORMAL LOW
Platelets: 182
RDW: 12.9
RDW: 13.5
RDW: 13.6
RDW: 13.7
RDW: 13.9
RDW: 14.5
WBC: 6.5

## 2010-10-20 LAB — BASIC METABOLIC PANEL
BUN: 11
BUN: 14
BUN: 17
BUN: 21
BUN: 9
CO2: 22
CO2: 26
CO2: 27
Calcium: 8.6
Chloride: 108
Creatinine, Ser: 1.31
Creatinine, Ser: 1.42
GFR calc non Af Amer: 49 — ABNORMAL LOW
GFR calc non Af Amer: 49 — ABNORMAL LOW
GFR calc non Af Amer: 53 — ABNORMAL LOW
GFR calc non Af Amer: 60 — ABNORMAL LOW
Glucose, Bld: 101 — ABNORMAL HIGH
Glucose, Bld: 78
Glucose, Bld: 84
Glucose, Bld: 93
Potassium: 3.4 — ABNORMAL LOW
Potassium: 3.5
Potassium: 4.1
Sodium: 143

## 2010-10-20 LAB — CROSSMATCH: ABO/RH(D): B POS

## 2010-10-20 LAB — CLOSTRIDIUM DIFFICILE EIA
C difficile Toxins A+B, EIA: NEGATIVE
C difficile Toxins A+B, EIA: NEGATIVE

## 2010-10-20 LAB — GLUCOSE, CAPILLARY
Glucose-Capillary: 100 — ABNORMAL HIGH
Glucose-Capillary: 101 — ABNORMAL HIGH
Glucose-Capillary: 106 — ABNORMAL HIGH
Glucose-Capillary: 109 — ABNORMAL HIGH
Glucose-Capillary: 113 — ABNORMAL HIGH
Glucose-Capillary: 121 — ABNORMAL HIGH
Glucose-Capillary: 131 — ABNORMAL HIGH
Glucose-Capillary: 136 — ABNORMAL HIGH
Glucose-Capillary: 145 — ABNORMAL HIGH
Glucose-Capillary: 167 — ABNORMAL HIGH
Glucose-Capillary: 71
Glucose-Capillary: 75
Glucose-Capillary: 92
Glucose-Capillary: 94

## 2010-10-20 LAB — URINE CULTURE

## 2010-10-20 LAB — DIFFERENTIAL
Basophils Absolute: 0
Basophils Relative: 0
Eosinophils Absolute: 0
Eosinophils Relative: 0
Monocytes Absolute: 0.2

## 2010-10-20 LAB — SEDIMENTATION RATE: Sed Rate: 35 — ABNORMAL HIGH

## 2010-10-20 LAB — POCT I-STAT, CHEM 8
BUN: 27 — ABNORMAL HIGH
Calcium, Ion: 1.12
Chloride: 101
Creatinine, Ser: 1.8 — ABNORMAL HIGH
TCO2: 26

## 2010-10-20 LAB — HEMATOCRIT: HCT: 33.5 — ABNORMAL LOW

## 2010-10-20 LAB — URINE MICROSCOPIC-ADD ON

## 2010-10-20 LAB — CULTURE, BLOOD (ROUTINE X 2)

## 2010-10-20 LAB — PSA: PSA: 12.84 — ABNORMAL HIGH

## 2010-10-20 LAB — LEGIONELLA ANTIGEN, URINE

## 2010-10-20 LAB — BLOOD GAS, ARTERIAL
Acid-base deficit: 0.5
O2 Saturation: 94.4
Patient temperature: 98.6
TCO2: 20.3
pCO2 arterial: 31.3 — ABNORMAL LOW

## 2010-10-20 LAB — HEMOGLOBIN AND HEMATOCRIT, BLOOD: Hemoglobin: 11.1 — ABNORMAL LOW

## 2010-10-20 LAB — HEMOGLOBIN A1C
Hgb A1c MFr Bld: 6.1
Mean Plasma Glucose: 140

## 2010-10-20 LAB — ABO/RH: ABO/RH(D): B POS

## 2010-10-20 LAB — OCCULT BLOOD X 1 CARD TO LAB, STOOL: Fecal Occult Bld: NEGATIVE

## 2010-10-20 LAB — PROTIME-INR: Prothrombin Time: 20.7 — ABNORMAL HIGH

## 2010-10-20 LAB — POTASSIUM: Potassium: 3.1 — ABNORMAL LOW

## 2010-10-20 LAB — CARDIAC PANEL(CRET KIN+CKTOT+MB+TROPI)
CK, MB: 2.8
Relative Index: 0.7
Relative Index: 1
Troponin I: 0.03

## 2010-10-20 LAB — MYCOPLASMA PNEUMONIAE ANTIBODY, IGM: Mycoplasma pneumo IgM: 74 Units/mL (ref <770)

## 2010-11-06 ENCOUNTER — Other Ambulatory Visit: Payer: Self-pay | Admitting: Internal Medicine

## 2010-11-06 DIAGNOSIS — R7989 Other specified abnormal findings of blood chemistry: Secondary | ICD-10-CM

## 2010-11-09 ENCOUNTER — Ambulatory Visit
Admission: RE | Admit: 2010-11-09 | Discharge: 2010-11-09 | Disposition: A | Discharge status: Home / Self Care | Payer: Medicare Other | Source: Ambulatory Visit | Attending: Internal Medicine | Admitting: Internal Medicine

## 2010-11-09 DIAGNOSIS — R7989 Other specified abnormal findings of blood chemistry: Secondary | ICD-10-CM

## 2011-08-06 ENCOUNTER — Other Ambulatory Visit: Payer: Self-pay | Admitting: Internal Medicine

## 2011-08-06 ENCOUNTER — Encounter: Payer: Self-pay | Admitting: Gastroenterology

## 2011-08-06 DIAGNOSIS — R634 Abnormal weight loss: Secondary | ICD-10-CM

## 2011-08-09 ENCOUNTER — Ambulatory Visit
Admission: RE | Admit: 2011-08-09 | Discharge: 2011-08-09 | Disposition: A | Discharge status: Home / Self Care | Payer: Medicare Other | Source: Ambulatory Visit | Attending: Internal Medicine | Admitting: Internal Medicine

## 2011-08-09 DIAGNOSIS — R634 Abnormal weight loss: Secondary | ICD-10-CM

## 2011-08-30 ENCOUNTER — Ambulatory Visit: Payer: Medicare Other | Admitting: Gastroenterology

## 2011-10-25 ENCOUNTER — Emergency Department (HOSPITAL_COMMUNITY): Payer: Medicare Other

## 2011-10-25 ENCOUNTER — Inpatient Hospital Stay (HOSPITAL_COMMUNITY)
Admission: EM | Admit: 2011-10-25 | Discharge: 2011-10-30 | DRG: 964 | Disposition: A | Discharge status: Skilled Nursing Facility | Payer: Medicare Other | Attending: General Surgery | Admitting: General Surgery

## 2011-10-25 DIAGNOSIS — S37819A Unspecified injury of adrenal gland, initial encounter: Secondary | ICD-10-CM | POA: Diagnosis present

## 2011-10-25 DIAGNOSIS — T07XXXA Unspecified multiple injuries, initial encounter: Secondary | ICD-10-CM

## 2011-10-25 DIAGNOSIS — E1149 Type 2 diabetes mellitus with other diabetic neurological complication: Secondary | ICD-10-CM | POA: Diagnosis present

## 2011-10-25 DIAGNOSIS — I714 Abdominal aortic aneurysm, without rupture, unspecified: Secondary | ICD-10-CM | POA: Diagnosis present

## 2011-10-25 DIAGNOSIS — J982 Interstitial emphysema: Secondary | ICD-10-CM | POA: Diagnosis present

## 2011-10-25 DIAGNOSIS — S2239XA Fracture of one rib, unspecified side, initial encounter for closed fracture: Secondary | ICD-10-CM

## 2011-10-25 DIAGNOSIS — S298XXA Other specified injuries of thorax, initial encounter: Secondary | ICD-10-CM | POA: Diagnosis present

## 2011-10-25 DIAGNOSIS — S32309A Unspecified fracture of unspecified ilium, initial encounter for closed fracture: Principal | ICD-10-CM

## 2011-10-25 DIAGNOSIS — M25469 Effusion, unspecified knee: Secondary | ICD-10-CM | POA: Diagnosis not present

## 2011-10-25 DIAGNOSIS — F3289 Other specified depressive episodes: Secondary | ICD-10-CM | POA: Diagnosis present

## 2011-10-25 DIAGNOSIS — N179 Acute kidney failure, unspecified: Secondary | ICD-10-CM | POA: Diagnosis not present

## 2011-10-25 DIAGNOSIS — S2249XA Multiple fractures of ribs, unspecified side, initial encounter for closed fracture: Secondary | ICD-10-CM | POA: Diagnosis present

## 2011-10-25 DIAGNOSIS — E039 Hypothyroidism, unspecified: Secondary | ICD-10-CM | POA: Diagnosis present

## 2011-10-25 DIAGNOSIS — K449 Diaphragmatic hernia without obstruction or gangrene: Secondary | ICD-10-CM | POA: Diagnosis present

## 2011-10-25 DIAGNOSIS — D62 Acute posthemorrhagic anemia: Secondary | ICD-10-CM | POA: Diagnosis not present

## 2011-10-25 DIAGNOSIS — Z9861 Coronary angioplasty status: Secondary | ICD-10-CM

## 2011-10-25 DIAGNOSIS — T797XXA Traumatic subcutaneous emphysema, initial encounter: Secondary | ICD-10-CM | POA: Diagnosis present

## 2011-10-25 DIAGNOSIS — Z87891 Personal history of nicotine dependence: Secondary | ICD-10-CM

## 2011-10-25 DIAGNOSIS — E1142 Type 2 diabetes mellitus with diabetic polyneuropathy: Secondary | ICD-10-CM | POA: Diagnosis present

## 2011-10-25 DIAGNOSIS — S37812A Contusion of adrenal gland, initial encounter: Secondary | ICD-10-CM | POA: Diagnosis present

## 2011-10-25 DIAGNOSIS — W309XXA Contact with unspecified agricultural machinery, initial encounter: Secondary | ICD-10-CM

## 2011-10-25 DIAGNOSIS — N400 Enlarged prostate without lower urinary tract symptoms: Secondary | ICD-10-CM | POA: Diagnosis present

## 2011-10-25 DIAGNOSIS — Z79899 Other long term (current) drug therapy: Secondary | ICD-10-CM

## 2011-10-25 DIAGNOSIS — M1990 Unspecified osteoarthritis, unspecified site: Secondary | ICD-10-CM | POA: Diagnosis present

## 2011-10-25 DIAGNOSIS — I2510 Atherosclerotic heart disease of native coronary artery without angina pectoris: Secondary | ICD-10-CM | POA: Diagnosis present

## 2011-10-25 DIAGNOSIS — F329 Major depressive disorder, single episode, unspecified: Secondary | ICD-10-CM | POA: Diagnosis present

## 2011-10-25 DIAGNOSIS — S3991XA Unspecified injury of abdomen, initial encounter: Secondary | ICD-10-CM | POA: Diagnosis present

## 2011-10-25 DIAGNOSIS — I252 Old myocardial infarction: Secondary | ICD-10-CM

## 2011-10-25 DIAGNOSIS — I10 Essential (primary) hypertension: Secondary | ICD-10-CM | POA: Diagnosis present

## 2011-10-25 DIAGNOSIS — Z7982 Long term (current) use of aspirin: Secondary | ICD-10-CM

## 2011-10-25 DIAGNOSIS — Z8546 Personal history of malignant neoplasm of prostate: Secondary | ICD-10-CM

## 2011-10-25 DIAGNOSIS — D649 Anemia, unspecified: Secondary | ICD-10-CM | POA: Diagnosis present

## 2011-10-25 DIAGNOSIS — E538 Deficiency of other specified B group vitamins: Secondary | ICD-10-CM | POA: Diagnosis present

## 2011-10-25 DIAGNOSIS — K219 Gastro-esophageal reflux disease without esophagitis: Secondary | ICD-10-CM | POA: Diagnosis present

## 2011-10-25 DIAGNOSIS — E7800 Pure hypercholesterolemia, unspecified: Secondary | ICD-10-CM | POA: Diagnosis present

## 2011-10-25 DIAGNOSIS — K589 Irritable bowel syndrome without diarrhea: Secondary | ICD-10-CM | POA: Diagnosis present

## 2011-10-25 LAB — COMPREHENSIVE METABOLIC PANEL WITH GFR
AST: 26 U/L (ref 0–37)
Albumin: 3.9 g/dL (ref 3.5–5.2)
Alkaline Phosphatase: 116 U/L (ref 39–117)
BUN: 19 mg/dL (ref 6–23)
Potassium: 3.5 meq/L (ref 3.5–5.1)
Sodium: 143 meq/L (ref 135–145)
Total Protein: 6.7 g/dL (ref 6.0–8.3)

## 2011-10-25 LAB — POCT I-STAT, CHEM 8
BUN: 20 mg/dL (ref 6–23)
Calcium, Ion: 1.19 mmol/L (ref 1.13–1.30)
Chloride: 108 mEq/L (ref 96–112)
Creatinine, Ser: 2.2 mg/dL — ABNORMAL HIGH (ref 0.50–1.35)
Glucose, Bld: 132 mg/dL — ABNORMAL HIGH (ref 70–99)
HCT: 28 % — ABNORMAL LOW (ref 39.0–52.0)
Hemoglobin: 9.5 g/dL — ABNORMAL LOW (ref 13.0–17.0)
Potassium: 3.6 mEq/L (ref 3.5–5.1)
Sodium: 144 mEq/L (ref 135–145)
TCO2: 24 mmol/L (ref 0–100)

## 2011-10-25 LAB — GLUCOSE, CAPILLARY: Glucose-Capillary: 157 mg/dL — ABNORMAL HIGH (ref 70–99)

## 2011-10-25 LAB — CBC
HCT: 29.9 % — ABNORMAL LOW (ref 39.0–52.0)
Hemoglobin: 10 g/dL — ABNORMAL LOW (ref 13.0–17.0)
MCH: 32.1 pg (ref 26.0–34.0)
MCHC: 33.4 g/dL (ref 30.0–36.0)
MCHC: 33.2 g/dL (ref 30.0–36.0)
MCV: 95.8 fL (ref 78.0–100.0)
Platelets: 157 10*3/uL (ref 150–400)
Platelets: 156 10*3/uL (ref 150–400)
RBC: 3.12 MIL/uL — ABNORMAL LOW (ref 4.22–5.81)
RDW: 13.8 % (ref 11.5–15.5)
RDW: 13.9 % (ref 11.5–15.5)
WBC: 7.1 K/uL (ref 4.0–10.5)
WBC: 8.8 10*3/uL (ref 4.0–10.5)

## 2011-10-25 LAB — PROTIME-INR
INR: 1.07 (ref 0.00–1.49)
Prothrombin Time: 13.8 s (ref 11.6–15.2)

## 2011-10-25 LAB — SAMPLE TO BLOOD BANK

## 2011-10-25 LAB — COMPREHENSIVE METABOLIC PANEL
ALT: 17 U/L (ref 0–53)
CO2: 25 mEq/L (ref 19–32)
Calcium: 9.6 mg/dL (ref 8.4–10.5)
Chloride: 106 mEq/L (ref 96–112)
Creatinine, Ser: 2.29 mg/dL — ABNORMAL HIGH (ref 0.50–1.35)
GFR calc Af Amer: 30 mL/min — ABNORMAL LOW (ref >90)
GFR calc non Af Amer: 25 mL/min — ABNORMAL LOW (ref >90)
Glucose, Bld: 135 mg/dL — ABNORMAL HIGH (ref 70–99)
Total Bilirubin: 0.4 mg/dL (ref 0.3–1.2)

## 2011-10-25 LAB — LACTIC ACID, PLASMA: Lactic Acid, Venous: 1.6 mmol/L (ref 0.5–2.2)

## 2011-10-25 MED ORDER — NEBIVOLOL HCL 2.5 MG PO TABS
2.5000 mg | ORAL_TABLET | Freq: Every day | ORAL | Status: DC
  Administered 2011-10-25 – 2011-10-30 (×6): 2.5 mg via ORAL
  Filled 2011-10-25 (×6): qty 1

## 2011-10-25 MED ORDER — OXYCODONE HCL 10 MG PO TB12
20.0000 mg | ORAL_TABLET | Freq: Two times a day (BID) | ORAL | Status: DC
  Administered 2011-10-25 – 2011-10-27 (×5): 20 mg via ORAL
  Filled 2011-10-25 (×5): qty 2

## 2011-10-25 MED ORDER — OXYCODONE HCL 5 MG PO TABS
10.0000 mg | ORAL_TABLET | ORAL | Status: DC | PRN
  Administered 2011-10-26: 10 mg via ORAL
  Filled 2011-10-25: qty 2

## 2011-10-25 MED ORDER — FENTANYL CITRATE 0.05 MG/ML IJ SOLN
INTRAMUSCULAR | Status: AC
  Administered 2011-10-25: 25 ug
  Filled 2011-10-25: qty 2

## 2011-10-25 MED ORDER — ESCITALOPRAM OXALATE 10 MG PO TABS
10.0000 mg | ORAL_TABLET | Freq: Every day | ORAL | Status: DC
  Administered 2011-10-25 – 2011-10-30 (×6): 10 mg via ORAL
  Filled 2011-10-25 (×6): qty 1

## 2011-10-25 MED ORDER — ATORVASTATIN CALCIUM 40 MG PO TABS
40.0000 mg | ORAL_TABLET | Freq: Every day | ORAL | Status: DC
  Administered 2011-10-25 – 2011-10-30 (×6): 40 mg via ORAL
  Filled 2011-10-25 (×6): qty 1

## 2011-10-25 MED ORDER — OXYCODONE HCL 10 MG PO TB12
40.0000 mg | ORAL_TABLET | Freq: Two times a day (BID) | ORAL | Status: DC
  Administered 2011-10-25 – 2011-10-27 (×5): 40 mg via ORAL
  Filled 2011-10-25: qty 4
  Filled 2011-10-25: qty 2
  Filled 2011-10-25: qty 4
  Filled 2011-10-25: qty 2
  Filled 2011-10-25: qty 4

## 2011-10-25 MED ORDER — FENTANYL CITRATE 0.05 MG/ML IJ SOLN
50.0000 ug | INTRAMUSCULAR | Status: DC | PRN
  Administered 2011-10-25: 50 ug via INTRAVENOUS
  Filled 2011-10-25: qty 2

## 2011-10-25 MED ORDER — ONDANSETRON HCL 4 MG/2ML IJ SOLN
4.0000 mg | Freq: Four times a day (QID) | INTRAMUSCULAR | Status: DC | PRN
Start: 2011-10-25 — End: 2011-10-30

## 2011-10-25 MED ORDER — PANTOPRAZOLE SODIUM 40 MG IV SOLR
40.0000 mg | Freq: Every day | INTRAVENOUS | Status: DC
  Filled 2011-10-25 (×2): qty 40

## 2011-10-25 MED ORDER — HYDROMORPHONE HCL PF 1 MG/ML IJ SOLN
1.0000 mg | INTRAMUSCULAR | Status: DC | PRN
  Administered 2011-10-25 – 2011-10-26 (×2): 1 mg via INTRAVENOUS
  Filled 2011-10-25 (×2): qty 1

## 2011-10-25 MED ORDER — LEVOTHYROXINE SODIUM 50 MCG PO TABS
50.0000 ug | ORAL_TABLET | Freq: Every day | ORAL | Status: DC
  Administered 2011-10-25 – 2011-10-30 (×6): 50 ug via ORAL
  Filled 2011-10-25 (×6): qty 1

## 2011-10-25 MED ORDER — POTASSIUM CHLORIDE IN NACL 20-0.9 MEQ/L-% IV SOLN
INTRAVENOUS | Status: DC
  Administered 2011-10-25 – 2011-10-28 (×3): via INTRAVENOUS
  Administered 2011-10-29: 75 mL/h via INTRAVENOUS
  Filled 2011-10-25 (×12): qty 1000

## 2011-10-25 MED ORDER — INSULIN ASPART 100 UNIT/ML ~~LOC~~ SOLN
0.0000 [IU] | Freq: Three times a day (TID) | SUBCUTANEOUS | Status: DC
  Administered 2011-10-26 – 2011-10-29 (×4): 1 [IU] via SUBCUTANEOUS

## 2011-10-25 MED ORDER — PANTOPRAZOLE SODIUM 40 MG PO TBEC
40.0000 mg | DELAYED_RELEASE_TABLET | Freq: Every day | ORAL | Status: DC
  Administered 2011-10-26 – 2011-10-30 (×5): 40 mg via ORAL
  Filled 2011-10-25 (×4): qty 1

## 2011-10-25 MED ORDER — CLONAZEPAM 0.5 MG PO TABS
0.5000 mg | ORAL_TABLET | Freq: Every day | ORAL | Status: DC
  Administered 2011-10-25 – 2011-10-29 (×5): 0.5 mg via ORAL
  Filled 2011-10-25 (×5): qty 1

## 2011-10-25 MED ORDER — ONDANSETRON HCL 4 MG PO TABS: 4.0000 mg | ORAL_TABLET | Freq: Four times a day (QID) | ORAL | Status: DC | PRN

## 2011-10-25 MED ORDER — DOCUSATE SODIUM 100 MG PO CAPS
100.0000 mg | ORAL_CAPSULE | Freq: Two times a day (BID) | ORAL | Status: DC
  Administered 2011-10-25 – 2011-10-29 (×9): 100 mg via ORAL
  Filled 2011-10-25 (×7): qty 1

## 2011-10-25 MED ORDER — INSULIN ASPART 100 UNIT/ML ~~LOC~~ SOLN: 0.0000 [IU] | Freq: Every day | SUBCUTANEOUS | Status: DC

## 2011-10-25 MED ORDER — TETANUS-DIPHTHERIA TOXOIDS TD 5-2 LFU IM INJ
0.5000 mL | INJECTION | Freq: Once | INTRAMUSCULAR | Status: AC
  Administered 2011-10-25: 0.5 mL via INTRAMUSCULAR
  Filled 2011-10-25 (×2): qty 0.5

## 2011-10-25 MED ORDER — IOHEXOL 300 MG/ML  SOLN
100.0000 mL | Freq: Once | INTRAMUSCULAR | Status: AC | PRN
  Administered 2011-10-25: 100 mL via INTRAVENOUS

## 2011-10-25 NOTE — Progress Notes (Signed)
Orthopedic Tech Progress Note Patient Details:  Paul Odonnell [birth year] [id number]  Patient ID: Paul Odonnell, male   DOB: 11/26/1930, 78 y.o.   MRN: [id number] Made trauma visit  Nikki Dom 10/25/2011, 2:48 PM

## 2011-10-25 NOTE — ED Notes (Signed)
Patient transported to CT 

## 2011-10-25 NOTE — H&P (Addendum)
Paul Odonnell is an 78 y.o. male.   Chief Complaint: Left hip pain HPI: Patient was trying to jump start his tractor when it started and rolled over him.  He denies LOC.  He C/O L hip pain.  He came in as a level 2 trauma.  Initial x-rays showed rib Fxs and L iliac Fx so we are asked to see him for admission.  Past Medical History  Diagnosis Date  . CAD (coronary artery disease)   . MI (myocardial infarction)   . Pancreatitis     with pancreas divisum  . Vitamin B 12 deficiency   . IBS (irritable bowel syndrome)   . HTN (hypertension)   . Depression   . Sepsis due to Pseudomonas     pyelonephritis  . DM (diabetes mellitus)   . Prostate cancer     radiation 2011  . Anemia   . BPH (benign prostatic hyperplasia)   . Renal insufficiency   . Pure hypercholesterolemia   . Hypothyroidism   . Urinary obstruction   . Osteoarthritis   . GERD (gastroesophageal reflux disease)   . Peptic stricture of esophagus   . AAA (abdominal aortic aneurysm)   . Gastritis   . Diverticulosis   . HH (hiatus hernia)     Past Surgical History  Procedure Date  . Coronary angioplasty with stent placement   Right ear surgery 40 years ago  Family History  Problem Relation Age of Onset  . Heart attack Father   . Diabetes Mother   . Heart attack Brother     x 2 brothers   Social History:  reports that he has quit smoking. He does not have any smokeless tobacco history on file. His alcohol and drug histories not on file.  Allergies:  Allergies  Allergen Reactions  . Codeine     Pancreatic problems   . Morphine And Related      (Not in a hospital admission)  Results for orders placed during the hospital encounter of 10/25/11 (from the past 48 hour(s))  SAMPLE TO BLOOD BANK     Status: Normal   Collection Time   10/25/11  2:50 PM      Component Value Range Comment   Blood Bank Specimen SAMPLE AVAILABLE FOR TESTING      Sample Expiration 10/26/2011     COMPREHENSIVE METABOLIC PANEL      Status: Abnormal   Collection Time   10/25/11  3:08 PM      Component Value Range Comment   Sodium 143  135 - 145 mEq/L    Potassium 3.5  3.5 - 5.1 mEq/L    Chloride 106  96 - 112 mEq/L    CO2 25  19 - 32 mEq/L    Glucose, Bld 135 (*) 70 - 99 mg/dL    BUN 19  6 - 23 mg/dL    Creatinine, Ser 1.47 (*) 0.50 - 1.35 mg/dL    Calcium 9.6  8.4 - 82.9 mg/dL    Total Protein 6.7  6.0 - 8.3 g/dL    Albumin 3.9  3.5 - 5.2 g/dL    AST 26  0 - 37 U/L    ALT 17  0 - 53 U/L    Alkaline Phosphatase 116  39 - 117 U/L    Total Bilirubin 0.4  0.3 - 1.2 mg/dL    GFR calc non Af Amer 25 (*) >90 mL/min    GFR calc Af Amer 30 (*) >90 mL/min   CBC  Status: Abnormal   Collection Time   10/25/11  3:08 PM      Component Value Range Comment   WBC 7.1  4.0 - 10.5 K/uL    RBC 3.12 (*) 4.22 - 5.81 MIL/uL    Hemoglobin 10.0 (*) 13.0 - 17.0 g/dL    HCT 16.1 (*) 09.6 - 52.0 %    MCV 95.8  78.0 - 100.0 fL    MCH 32.1  26.0 - 34.0 pg    MCHC 33.4  30.0 - 36.0 g/dL    RDW 04.5  40.9 - 81.1 %    Platelets 157  150 - 400 K/uL   LACTIC ACID, PLASMA     Status: Normal   Collection Time   10/25/11  3:08 PM      Component Value Range Comment   Lactic Acid, Venous 1.6  0.5 - 2.2 mmol/L   PROTIME-INR     Status: Normal   Collection Time   10/25/11  3:08 PM      Component Value Range Comment   Prothrombin Time 13.8  11.6 - 15.2 seconds    INR 1.07  0.00 - 1.49   POCT I-STAT, CHEM 8     Status: Abnormal   Collection Time   10/25/11  3:15 PM      Component Value Range Comment   Sodium 144  135 - 145 mEq/L    Potassium 3.6  3.5 - 5.1 mEq/L    Chloride 108  96 - 112 mEq/L    BUN 20  6 - 23 mg/dL    Creatinine, Ser 9.14 (*) 0.50 - 1.35 mg/dL    Glucose, Bld 782 (*) 70 - 99 mg/dL    Calcium, Ion 9.56  2.13 - 1.30 mmol/L    TCO2 24  0 - 100 mmol/L    Hemoglobin 9.5 (*) 13.0 - 17.0 g/dL    HCT 08.6 (*) 57.8 - 52.0 %    Dg Pelvis Portable  10/25/2011  *RADIOLOGY REPORT*  Clinical Data: Injury, pain.  PORTABLE  PELVIS  Comparison: None.  Findings: There is a fracture of the left iliac wing which may extend the left SI joint.  No other fracture is identified.  There is some degenerative change about the hips.  IMPRESSION: Left iliac wing fracture may extend to the SI joint.  No other acute abnormality is identified.   Original Report Authenticated By: Bernadene Bell. D'ALESSIO, M.D.    Dg Chest Portable 1 View  10/25/2011  *RADIOLOGY REPORT*  Clinical Data: Chest pain.  Trauma.  PORTABLE CHEST - 1 VIEW  Comparison: PA and lateral chest 08/06/2011.  Findings: Mild elevation of the right hemidiaphragm is again seen. Lungs are clear.  No pneumothorax or pleural fluid.  The patient has fractures of the right third through fifth ribs.  Small pleural fluid on the right is noted.  Cardiac and mediastinal contours are unremarkable.  IMPRESSION: Right third through fifth rib fractures.  No other acute abnormality.   Original Report Authenticated By: Bernadene Bell. Maricela Curet, M.D.     Review of Systems  Constitutional: Negative.   HENT: Negative.   Eyes: Negative.   Respiratory: Negative for shortness of breath and wheezing.   Cardiovascular:       Chest pain with deep inspiration  Gastrointestinal: Negative for nausea, vomiting and abdominal pain.  Genitourinary:       History of prostate cancer  Musculoskeletal:       Pain left hip exacerbated by moving his leg  Skin: Negative.   Neurological:       Questionable recent dizzy spells  Endo/Heme/Allergies: Negative.     Blood pressure 166/70, pulse 80, temperature 97.7 F (36.5 C), temperature source Oral, resp. rate 14, SpO2 100.00%. Physical Exam  Constitutional: He is oriented to person, place, and time. He appears well-developed and well-nourished. No distress.  HENT:  Head: Normocephalic and atraumatic.  Right Ear: External ear normal.  Mouth/Throat: Oropharynx is clear and moist. No oropharyngeal exudate.  Eyes: EOM are normal. Pupils are equal, round, and  reactive to light. Right eye exhibits no discharge. Left eye exhibits no discharge. No scleral icterus.  Neck: Normal range of motion. Neck supple. No tracheal deviation present.       No posterior midline tenderness  Cardiovascular: Normal rate, regular rhythm and normal heart sounds.   Respiratory: Effort normal and breath sounds normal. No stridor. No respiratory distress. He has no wheezes. He has no rales. He exhibits tenderness.       Bilateral rib tenderness  GI: Soft. He exhibits no distension. There is no tenderness. There is no rebound and no guarding.         Tenderness at left anterior superior iliac spine, Contusion left upper quadrant  Musculoskeletal:       Pain in L hip with movement left leg, upper extremities without tenderness, right leg without tenderness  Neurological: He is alert and oriented to person, place, and time. He has normal strength. GCS eye subscore is 4. GCS verbal subscore is 5. GCS motor subscore is 6.       Movement limited left lower extremity due to pain  Skin: Skin is warm and dry.     Assessment/Plan Struck by a tractor with right-sided rib fractures x3, left-sided rib fractures x4, left adrenal hemorrhage, left iliac wing fracture, tiny pneumomediastinum. We'll plan admission to the trauma service and step down unit. Dr. Ranell Patrick will see him from orthopedics. We will work on pulmonary toilet and pain control. We will await weightbearing status and orthopedic plans prior to mobilization. I discussed the plan in detail the patient and his wife. Jorian Willhoite E 10/25/2011, 4:21 PM

## 2011-10-25 NOTE — Consult Note (Signed)
CC: left hip and low back pain s/p farming accident HPI: 78 y/o male trying to jump start his tractor and tractor ran over the left side of his body. Pt presented to the emergency department c/o left hip, flank and back pain Denies any loc or any other injuries than just abrasions.  PMH: hx of MI, pancreatitis, coronary heart disease, IBS, hypertension, diabetes, hx of prostate cancer, renal insufficiency, bph, AAA, gastritis, GERD, diverticulosis Social: former smoker,  Family: coronary heart disease, hypertension Allergies: codeine, morphine Meds: see above ROS: pain with ambulation, pain to left iliac wing and lumbar spine, multiple superficial abrasions PE: alert and appropriate 78 y/o male in no acute distress  Vital signs within acceptable limits, pt is stable currently Cervical spine shows full rom no tenderness cranial nerves 2-12 intact Mild tenderness to lumbar spine with palpation abd mild tenderness to periumbilical area Pelvis: moderate tenderness and mild ecchysmosis to left iliac wing Right pelvis stable Bilateral upper extremities show full rom, no deformity or injury Bilateral lower extremities show good rom, small superficial abrasion to bilateral legs, nv intact distally Strength and sensation intact distally X-rays, CT scans: left iliac wing comminuted fracture, SI joint stable, pubic symphsis intact, no sacral fx Assessment: stable left iliac wing fracture Plan: Limit weight bearing on left side with walker Pain management dvt prophylaxis Bowel management to avoid constipation We will continue to monitor thank you for the consult.
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
## 2011-10-25 NOTE — Progress Notes (Signed)
10/25/2011 6:11 PM Pt arrived from ed. Vss. Oriented to unit and safety discussed. Call bell with in reach. Will continue to monitor. Paul Odonnell

## 2011-10-25 NOTE — ED Notes (Signed)
Patient transported from CT 

## 2011-10-25 NOTE — ED Notes (Signed)
Presently awake, alert, oriented x4 - conversant - wife at bedside - appears comfortable at this time

## 2011-10-25 NOTE — Progress Notes (Signed)
Chaplain immediately responded to ED Trauma page by attending to patient at ED Trauma B. Chaplain shared words of encouragement and comfort to patient and his wife. Chaplain also provided ministry of presence and served as liaison between patient, wife and nurses. Patient and wife expressed their appreciation for Chaplain's spiritual support and care. Chaplain will continue to provide care and support to both patient and family as needed at a later time.

## 2011-10-25 NOTE — ED Notes (Signed)
Patient's family at bedside

## 2011-10-26 ENCOUNTER — Inpatient Hospital Stay (HOSPITAL_COMMUNITY): Payer: Medicare Other

## 2011-10-26 ENCOUNTER — Encounter (HOSPITAL_COMMUNITY): Payer: Self-pay | Admitting: *Deleted

## 2011-10-26 DIAGNOSIS — D62 Acute posthemorrhagic anemia: Secondary | ICD-10-CM | POA: Diagnosis present

## 2011-10-26 DIAGNOSIS — S298XXA Other specified injuries of thorax, initial encounter: Secondary | ICD-10-CM | POA: Diagnosis present

## 2011-10-26 DIAGNOSIS — S3991XA Unspecified injury of abdomen, initial encounter: Secondary | ICD-10-CM | POA: Diagnosis present

## 2011-10-26 DIAGNOSIS — D649 Anemia, unspecified: Secondary | ICD-10-CM | POA: Diagnosis present

## 2011-10-26 LAB — CBC
MCHC: 33.3 g/dL (ref 30.0–36.0)
MCV: 93.8 fL (ref 78.0–100.0)
Platelets: 142 10*3/uL — ABNORMAL LOW (ref 150–400)
RDW: 13.8 % (ref 11.5–15.5)
WBC: 6.1 10*3/uL (ref 4.0–10.5)

## 2011-10-26 LAB — GLUCOSE, CAPILLARY: Glucose-Capillary: 118 mg/dL — ABNORMAL HIGH (ref 70–99)

## 2011-10-26 LAB — BASIC METABOLIC PANEL
Calcium: 8.7 mg/dL (ref 8.4–10.5)
Chloride: 106 mEq/L (ref 96–112)
Creatinine, Ser: 1.99 mg/dL — ABNORMAL HIGH (ref 0.50–1.35)
GFR calc Af Amer: 35 mL/min — ABNORMAL LOW (ref >90)
GFR calc non Af Amer: 30 mL/min — ABNORMAL LOW (ref >90)

## 2011-10-26 MED ORDER — HYDROMORPHONE HCL PF 1 MG/ML IJ SOLN
1.0000 mg | INTRAMUSCULAR | Status: DC | PRN
  Administered 2011-10-26 – 2011-10-28 (×8): 1 mg via INTRAVENOUS
  Filled 2011-10-26 (×8): qty 1

## 2011-10-26 MED ORDER — OXYCODONE HCL 5 MG PO TABS
5.0000 mg | ORAL_TABLET | ORAL | Status: DC | PRN
  Administered 2011-10-26 (×2): 10 mg via ORAL
  Administered 2011-10-27: 15 mg via ORAL
  Administered 2011-10-27: 10 mg via ORAL
  Filled 2011-10-26 (×2): qty 2
  Filled 2011-10-26: qty 3
  Filled 2011-10-26: qty 2
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
## 2011-10-26 NOTE — Progress Notes (Signed)
Patient admitted to 6N07. VSS. Placed on telemetry box [phone] and confirmed with monitor tech. A&Ox4. Patient oriented to room and unit and all questions answered. RN will continue to monitor.

## 2011-10-26 NOTE — Progress Notes (Signed)
Nutrition Brief Note  Patient identified on the Malnutrition Screening Tool (MST) report for Unintentional weight loss and decreased appetite, generating a score of 3.   Body mass index is 20.92 kg/(m^2). Pt meets criteria for normal weight based on current BMI.   Current diet order is CHO modified. Labs and medications reviewed.   No nutrition interventions warranted at this time. If nutrition issues arise, please consult RD.   Leonette Most RD, LDN
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
## 2011-10-26 NOTE — Progress Notes (Signed)
Pt transferred to 6N room 7, per MD order. Report called to receiving nurse and all questions answered.
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
## 2011-10-26 NOTE — Progress Notes (Signed)
Foley catheter d/c at 0930 per MD order.

## 2011-10-26 NOTE — Progress Notes (Signed)
UR completed 

## 2011-10-27 ENCOUNTER — Inpatient Hospital Stay (HOSPITAL_COMMUNITY): Payer: Medicare Other

## 2011-10-27 LAB — GLUCOSE, CAPILLARY: Glucose-Capillary: 111 mg/dL — ABNORMAL HIGH (ref 70–99)

## 2011-10-27 LAB — BASIC METABOLIC PANEL
CO2: 24 mEq/L (ref 19–32)
Calcium: 8.4 mg/dL (ref 8.4–10.5)
Chloride: 105 mEq/L (ref 96–112)
Creatinine, Ser: 1.68 mg/dL — ABNORMAL HIGH (ref 0.50–1.35)
Glucose, Bld: 103 mg/dL — ABNORMAL HIGH (ref 70–99)

## 2011-10-27 LAB — CBC
Hemoglobin: 6.8 g/dL — CL (ref 13.0–17.0)
MCH: 32.2 pg (ref 26.0–34.0)
MCV: 94.3 fL (ref 78.0–100.0)
RBC: 2.11 MIL/uL — ABNORMAL LOW (ref 4.22–5.81)
WBC: 7.8 10*3/uL (ref 4.0–10.5)

## 2011-10-27 LAB — TROPONIN I: Troponin I: <0.3 ng/mL (ref <0.30)

## 2011-10-27 LAB — PREPARE RBC (CROSSMATCH)

## 2011-10-27 MED ORDER — OXYCODONE HCL 10 MG PO TB12
40.0000 mg | ORAL_TABLET | ORAL | Status: DC
  Administered 2011-10-28: 40 mg via ORAL
  Filled 2011-10-27 (×2): qty 4

## 2011-10-27 MED ORDER — ENSURE PUDDING PO PUDG
1.0000 | Freq: Three times a day (TID) | ORAL | Status: DC
  Administered 2011-10-27 – 2011-10-29 (×6): 1 via ORAL

## 2011-10-27 MED ORDER — OXYCODONE HCL 10 MG PO TB12: 40.0000 mg | ORAL_TABLET | ORAL | Status: DC

## 2011-10-27 MED ORDER — OXYCODONE HCL 10 MG PO TB12
20.0000 mg | ORAL_TABLET | ORAL | Status: DC
  Administered 2011-10-28 – 2011-10-29 (×2): 20 mg via ORAL
  Filled 2011-10-27 (×2): qty 2

## 2011-10-27 MED ORDER — DIPHENHYDRAMINE HCL 50 MG/ML IJ SOLN
12.5000 mg | Freq: Once | INTRAMUSCULAR | Status: AC
  Administered 2011-10-27: 12.5 mg via INTRAVENOUS
  Filled 2011-10-27: qty 1

## 2011-10-27 MED ORDER — ACETAMINOPHEN 325 MG PO TABS
650.0000 mg | ORAL_TABLET | Freq: Once | ORAL | Status: AC
  Administered 2011-10-27: 650 mg via ORAL
  Filled 2011-10-27: qty 2

## 2011-10-27 NOTE — Progress Notes (Signed)
PT Cancellation Note  Patient Details Name: Paul Odonnell MRN: [id number] DOB: 26-Sep-1930   Cancelled Treatment:    Reason Eval/Treat Not Completed: Pain limiting ability to participate;Patient's level of consciousness;Other (comment) (Pt increased agitation and confusion. )   [place] Richelle Ito 10/27/2011, 4:07 PM

## 2011-10-27 NOTE — Progress Notes (Signed)
Received report and assessed patient in bed. Alert and responded to his name. Some confusion to environment noted, however, able to answer yes or no questions able to verbalized needs with short phrases. No acute distress noted. V/S stable. Will monitor.
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
## 2011-10-28 LAB — CBC
HCT: 22.5 % — ABNORMAL LOW (ref 39.0–52.0)
Hemoglobin: 7.7 g/dL — ABNORMAL LOW (ref 13.0–17.0)
RBC: 2.47 MIL/uL — ABNORMAL LOW (ref 4.22–5.81)
WBC: 8.9 10*3/uL (ref 4.0–10.5)

## 2011-10-28 LAB — GLUCOSE, CAPILLARY
Glucose-Capillary: 149 mg/dL — ABNORMAL HIGH (ref 70–99)
Glucose-Capillary: 107 mg/dL — ABNORMAL HIGH (ref 70–99)

## 2011-10-28 LAB — BASIC METABOLIC PANEL
BUN: 25 mg/dL — ABNORMAL HIGH (ref 6–23)
Chloride: 106 mEq/L (ref 96–112)
GFR calc Af Amer: 53 mL/min — ABNORMAL LOW (ref >90)
GFR calc non Af Amer: 45 mL/min — ABNORMAL LOW (ref >90)
Potassium: 4.5 mEq/L (ref 3.5–5.1)
Sodium: 136 mEq/L (ref 135–145)

## 2011-10-28 LAB — TROPONIN I: Troponin I: <0.3 ng/mL (ref <0.30)

## 2011-10-28 LAB — ABO/RH: ABO/RH(D): B POS

## 2011-10-28 MED ORDER — HALOPERIDOL LACTATE 5 MG/ML IJ SOLN: 5.0000 mg | Freq: Four times a day (QID) | INTRAMUSCULAR | Status: DC | PRN
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
## 2011-10-28 NOTE — Progress Notes (Signed)
Physical Therapy Treatment Patient Details Name: Paul Odonnell MRN: [id number] DOB: December 04, 1930 Today's Date: 10/28/2011 Time: 4098-1191 PT Time Calculation (min): 24 min  PT Assessment / Plan / Recommendation Comments on Treatment Session  Slow improvements in mobility and cognition today.  Feel patient will benefit from SNF at discharge for continued therapy.    Follow Up Recommendations  Post acute inpatient     Does the patient have the potential to tolerate intense rehabilitation  No, Recommend SNF  Barriers to Discharge        Equipment Recommendations  Rolling walker with 5" wheels;3 in 1 bedside comode;Tub/shower seat    Recommendations for Other Services    Frequency Min 5X/week   Plan Discharge plan needs to be updated;Frequency remains appropriate    Precautions / Restrictions Precautions Precautions: Fall Restrictions Weight Bearing Restrictions: Yes RLE Weight Bearing: Weight bearing as tolerated LLE Weight Bearing: Weight bearing as tolerated   Pertinent Vitals/Pain Pain limiting factor for mobility.    Mobility  Bed Mobility Bed Mobility: Supine to Sit;Sitting - Scoot to Edge of Bed Supine to Sit: 1: +2 Total assist Supine to Sit: Patient Percentage: 30% Sitting - Scoot to Edge of Bed: 3: Mod assist Details for Bed Mobility Assistance: Verbal and tactile cues for technique.  Patient holding onto side of bed initially.  Encouraged patient to assist with UE's - tending to push backward against PT. Transfers Transfers: Sit to Stand;Stand to Sit;Stand Pivot Transfers Sit to Stand: 1: +2 Total assist;With upper extremity assist;From bed Sit to Stand: Patient Percentage: 60% Stand to Sit: 1: +2 Total assist;With upper extremity assist;To bed;To chair/3-in-1 Stand to Sit: Patient Percentage: 50% Stand Pivot Transfers: 1: +2 Total assist Stand Pivot Transfers: Patient Percentage: 60% Details for Transfer Assistance: Verbal and tactile cues for hand placement.   Stood with use of RW x 20 seconds - unable to reach upright position.  Transferred bed to chair without assistive device with +2 assist.  Assist to initiate movement. Needed repeated cuing for sequence during transfer. Ambulation/Gait Ambulation/Gait Assistance: Not tested (comment)      PT Goals Acute Rehab PT Goals PT Goal: Supine/Side to Sit - Progress: Progressing toward goal PT Goal: Sit to Stand - Progress: Progressing toward goal PT Goal: Stand to Sit - Progress: Progressing toward goal PT Transfer Goal: Bed to Chair/Chair to Bed - Progress: Progressing toward goal  Visit Information  Last PT Received On: 10/28/11 Assistance Needed: +2    Subjective Data  Subjective: "I just need to go slow"  "Oh, my back hurts"   Cognition  Overall Cognitive Status: Impaired Area of Impairment: Memory;Safety/judgement;Awareness of deficits;Problem solving Arousal/Alertness: Awake/alert Orientation Level: Disoriented to;Time Behavior During Session: [facility] for tasks performed Memory Deficits: Difficulty recalling events of yesterday. Safety/Judgement: Decreased safety judgement for tasks assessed Problem Solving: Difficulty processing steps of transfer    Balance  Balance Balance Assessed: Yes Static Sitting Balance Static Sitting - Balance Support: Bilateral upper extremity supported;Feet supported Static Sitting - Level of Assistance: 4: Min assist Static Sitting - Comment/# of Minutes: Patient required assist to maintain upright balance.  Able to sit on EOB x 5 minutes - limited by back pain.  End of Session PT - End of Session Equipment Utilized During Treatment: Gait belt Activity Tolerance: Patient limited by pain;Patient limited by fatigue Patient left: in chair;with call bell/phone within reach;with family/visitor present;with nursing in room Nurse Communication: Mobility status   GP     Vena Austria 10/28/2011, 1:04 PM Darl Pikes  Matthew Saras, PT, MBA Acute Rehab Services Pager  [phone]

## 2011-10-28 NOTE — Progress Notes (Signed)
Orthopedics Progress Note  Subjective: Pt alert c/o mild soreness to left hip/pelvis today   Objective:  Filed Vitals:   10/28/11 0600  BP: 169/70  Pulse: 76  Temp: 99.3 F (37.4 C)  Resp: 18    General: Awake and alert  Musculoskeletal: mild tenderness to left hemi pelvis, nv intact distally Neurovascularly intact  Lab Results  Component Value Date   WBC 8.9 10/28/2011   HGB 7.7* 10/28/2011   HCT 22.5* 10/28/2011   MCV 91.1 10/28/2011   PLT 111* 10/28/2011       Component Value Date/Time   NA 136 10/28/2011 0143   K 4.5 10/28/2011 0143   CL 106 10/28/2011 0143   CO2 20 10/28/2011 0143   GLUCOSE 152* 10/28/2011 0143   BUN 25* 10/28/2011 0143   CREATININE 1.42* 10/28/2011 0143   CALCIUM 8.4 10/28/2011 0143   GFRNONAA 45* 10/28/2011 0143   GFRAA 53* 10/28/2011 0143    Lab Results  Component Value Date   INR 1.07 10/25/2011   INR 1.7* 08/07/2007   INR 1.1 04/02/2007    Assessment/Plan: Left iliac wing fracture  Pain control D/c planning dvt prophylaxis   Viviann Spare R. Ranell Patrick, MD 10/28/2011 7:55 AM
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
## 2011-10-29 ENCOUNTER — Inpatient Hospital Stay (HOSPITAL_COMMUNITY): Payer: Medicare Other

## 2011-10-29 LAB — BASIC METABOLIC PANEL
CO2: 22 mEq/L (ref 19–32)
Chloride: 104 mEq/L (ref 96–112)
Creatinine, Ser: 1.21 mg/dL (ref 0.50–1.35)
GFR calc Af Amer: 64 mL/min — ABNORMAL LOW (ref >90)
Potassium: 4.1 mEq/L (ref 3.5–5.1)
Sodium: 135 mEq/L (ref 135–145)

## 2011-10-29 LAB — CBC WITH DIFFERENTIAL/PLATELET
Basophils Absolute: 0 10*3/uL (ref 0.0–0.1)
HCT: 23.1 % — ABNORMAL LOW (ref 39.0–52.0)
Hemoglobin: 7.8 g/dL — ABNORMAL LOW (ref 13.0–17.0)
Lymphocytes Relative: 11 % — ABNORMAL LOW (ref 12–46)
Lymphs Abs: 0.8 10*3/uL (ref 0.7–4.0)
MCV: 92 fL (ref 78.0–100.0)
Monocytes Absolute: 0.6 10*3/uL (ref 0.1–1.0)
Neutro Abs: 6.4 10*3/uL (ref 1.7–7.7)
RBC: 2.51 MIL/uL — ABNORMAL LOW (ref 4.22–5.81)
RDW: 14.6 % (ref 11.5–15.5)
WBC: 7.8 10*3/uL (ref 4.0–10.5)

## 2011-10-29 LAB — URINALYSIS, MICROSCOPIC ONLY
Glucose, UA: NEGATIVE mg/dL
Ketones, ur: NEGATIVE mg/dL
Leukocytes, UA: NEGATIVE
Nitrite: NEGATIVE
Protein, ur: NEGATIVE mg/dL
Urobilinogen, UA: 1 mg/dL (ref 0.0–1.0)

## 2011-10-29 LAB — GLUCOSE, CAPILLARY
Glucose-Capillary: 130 mg/dL — ABNORMAL HIGH (ref 70–99)
Glucose-Capillary: 97 mg/dL (ref 70–99)
Glucose-Capillary: 146 mg/dL — ABNORMAL HIGH (ref 70–99)
Glucose-Capillary: 183 mg/dL — ABNORMAL HIGH (ref 70–99)

## 2011-10-29 MED ORDER — HYDROCODONE-ACETAMINOPHEN 5-325 MG PO TABS: 0.5000 | ORAL_TABLET | ORAL | Status: DC | PRN

## 2011-10-29 MED ORDER — BUPIVACAINE HCL (PF) 0.5 % IJ SOLN
20.0000 mL | Freq: Once | INTRAMUSCULAR | Status: DC
  Filled 2011-10-29 (×2): qty 20

## 2011-10-29 MED ORDER — LIDOCAINE HCL (PF) 1 % IJ SOLN
INTRAMUSCULAR | Status: AC
  Filled 2011-10-29: qty 5

## 2011-10-29 MED ORDER — TRAMADOL HCL 50 MG PO TABS
100.0000 mg | ORAL_TABLET | Freq: Four times a day (QID) | ORAL | Status: DC
  Administered 2011-10-29 – 2011-10-30 (×5): 100 mg via ORAL
  Filled 2011-10-29 (×7): qty 2

## 2011-10-29 MED ORDER — METHYLPREDNISOLONE ACETATE 40 MG/ML IJ SUSP
40.0000 mg | Freq: Once | INTRAMUSCULAR | Status: AC
  Administered 2011-10-29: 40 mg via INTRA_ARTICULAR
  Filled 2011-10-29 (×2): qty 1

## 2011-10-29 MED ORDER — OXYCODONE HCL 10 MG PO TB12
30.0000 mg | ORAL_TABLET | Freq: Two times a day (BID) | ORAL | Status: DC
  Administered 2011-10-29: 30 mg via ORAL
  Filled 2011-10-29: qty 2
  Filled 2011-10-29: qty 1

## 2011-10-29 NOTE — Progress Notes (Signed)
Talkative today.  Will see how it goes with PT.  May need to do rehab at SNF vs CIR. I also spoke to his wife. Patient examined and I agree with the assessment and plan  Violeta Gelinas, MD, MPH, FACS Pager: [phone]  10/29/2011 10:48 AM
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
## 2011-10-29 NOTE — Progress Notes (Signed)
Physical Therapy Treatment Patient Details Name: Paul Odonnell MRN: [id number] DOB: 28-Jun-1930 Today's Date: 10/29/2011 Time: 1720-1800 PT Time Calculation (min): 40 min  PT Assessment / Plan / Recommendation Comments on Treatment Session  Pt presents with pain edema and limited ROM in R knee secondary to OA.  MD present at start of session and reports pt to have fluid drawn off his knee tomorrow.        Follow Up Recommendations  Post acute inpatient     Does the patient have the potential to tolerate intense rehabilitation  No, Recommend SNF  Barriers to Discharge        Equipment Recommendations  Rolling walker with 5" wheels;3 in 1 bedside comode;Tub/shower seat    Recommendations for Other Services    Frequency Min 5X/week   Plan Discharge plan needs to be updated;Frequency remains appropriate    Precautions / Restrictions Precautions Precautions: Fall Restrictions Weight Bearing Restrictions: Yes RLE Weight Bearing: Weight bearing as tolerated LLE Weight Bearing: Weight bearing as tolerated   Pertinent Vitals/Pain C/o L hip and R knee pain but unable to rate. Notified RN who medicated pt during session.     Mobility  Bed Mobility Bed Mobility: Supine to Sit;Sitting - Scoot to [place] of Bed;Sit to Supine;Rolling Right Rolling Right: 2: Max assist Supine to Sit: 2: Max assist;HOB elevated;With rails Supine to Sit: Patient Percentage: 40% Sit to Supine: 1: +1 Total assist;HOB elevated Sit to Supine: Patient Percentage: 20% Scooting to HOB: 1: +1 Total assist Details for Bed Mobility Assistance: Pt insistent on moving bilateral LE independently for less pain.  Pt did allow PT to assist with L LE once his L heel was off the bed.  Lots of time required in supine with bilateral LEs off the bed prior to attempting to sit up secondary to pt anticipation of pain. Pt instructed to squeeze a pillow across his abdomen while PT assisted pt with trunk  flexion.   Pt tolerated  transition to sitting well. Pt required total assist for bilater LE management secondary to L hip pain for transition to supine from sitting.  Attempt to R side from sitting unsuccessful.  Transfers Transfers: Sit to Stand;Stand to Sit Sit to Stand: With upper extremity assist;From bed;3: Mod assist Sit to Stand: Patient Percentage: 60% Stand to Sit: 3: Mod assist;To bed;With upper extremity assist Stand to Sit: Patient Percentage: 60% Stand Pivot Transfers: Not tested (comment) Details for Transfer Assistance: Pt required verbal cues for hand placement on bed rail, assist to initiate standing with RW.  Pt stood as side of bed >1 minute in upright position.  Pt uanble to tolerate full wb on L LE.  Manual facilitation to shift wt laterally.  Pt also having pain in  R knee.  Manual facilitation for controlled descent to bed from standing.   Ambulation/Gait Ambulation/Gait Assistance: Not tested (comment)    Exercises     PT Diagnosis:    PT Problem List:   PT Treatment Interventions:     PT Goals Acute Rehab PT Goals PT Goal Formulation: With patient Time For Goal Achievement: 11/09/11 Potential to Achieve Goals: Fair Pt will go Supine/Side to Sit: with modified independence PT Goal: Supine/Side to Sit - Progress: Progressing toward goal Pt will go Sit to Supine/Side: with modified independence PT Goal: Sit to Supine/Side - Progress: Progressing toward goal Pt will go Sit to Stand: with modified independence PT Goal: Sit to Stand - Progress: Progressing toward goal Pt will go Stand to  Sit: with modified independence PT Goal: Stand to Sit - Progress: Progressing toward goal Pt will Transfer Bed to Chair/Chair to Bed: with supervision PT Transfer Goal: Bed to Chair/Chair to Bed - Progress: Progressing toward goal Pt will Ambulate: 16 - 50 feet;with supervision;with least restrictive assistive device  Visit Information  Last PT Received On: 10/29/11    Subjective Data  Subjective:  Go slowly don't pull on it.   Patient Stated Goal: to get home   Cognition  Overall Cognitive Status: Appears within functional limits for tasks assessed/performed Arousal/Alertness: Awake/alert Orientation Level: Appears intact for tasks assessed Behavior During Session: [facility] for tasks performed    Balance  Balance Balance Assessed: Yes Static Sitting Balance Static Sitting - Balance Support: Right upper extremity supported;Feet supported Static Sitting - Level of Assistance: 3: Mod assist Static Sitting - Comment/# of Minutes: Pt able to maintain sitting balance > 1 min without assistance. PT provided assistance to prevent posterior lean,.   Static Standing Balance Static Standing - Balance Support: Bilateral upper extremity supported Static Standing - Level of Assistance: 2: Max assist Static Standing - Comment/# of Minutes: Pt able to maintain standing balance greater than 30 seconds without assistance.    End of Session PT - End of Session Equipment Utilized During Treatment: Gait belt Activity Tolerance: Patient limited by pain;Patient limited by fatigue Patient left: in bed;with call bell/phone within reach;with family/visitor present Nurse Communication: Mobility status   GP     Paul Odonnell 10/29/2011, 6:53 PM Paul Odonnell DPT [phone]

## 2011-10-29 NOTE — Progress Notes (Addendum)
Orthopedics Progress Note  Subjective: Pt resting c/o mild to moderate pain to left hip/pelvis today Got up yesterday once  Objective:  Filed Vitals:   10/29/11 0617  BP: 147/73  Pulse: 69  Temp: 98.2 F (36.8 C)  Resp: 15    General: Awake and alert  Musculoskeletal: left hip/pelvis with mild edema and ecchymosis, pain with rom Neurovascularly intact  Lab Results  Component Value Date   WBC 7.8 10/29/2011   HGB 7.8* 10/29/2011   HCT 23.1* 10/29/2011   MCV 92.0 10/29/2011   PLT 142* 10/29/2011       Component Value Date/Time   NA 135 10/29/2011 0625   K 4.1 10/29/2011 0625   CL 104 10/29/2011 0625   CO2 22 10/29/2011 0625   GLUCOSE 97 10/29/2011 0625   BUN 21 10/29/2011 0625   CREATININE 1.21 10/29/2011 0625   CALCIUM 8.5 10/29/2011 0625   GFRNONAA 55* 10/29/2011 0625   GFRAA 64* 10/29/2011 0625    Lab Results  Component Value Date   INR 1.07 10/25/2011   INR 1.7* 08/07/2007   INR 1.1 04/02/2007    Assessment/Plan: Left iliac wing fracture  Fracture is stable currently Continue PT/OT as able D/c planning to suspected snf Will continue to monitor  Viviann Spare R. Ranell Patrick, MD 10/29/2011 12:01 PM    Patient reports a lot of right knee pain.  He was scheduled to have the right one looked at as an outpatient.  Offered an aspiration of large effusion and cortisone injection.  He and his wife agree.  This could really help his mobility which is poor now.  Plan asp/inj in AM> Beverely Low, MD

## 2011-10-29 NOTE — Progress Notes (Signed)
Rehab Admissions Coordinator Note:  Patient was screened by Lutricia Horsfall, RN  for appropriateness for an Inpatient Acute Rehab Consult. Noted PT and OT recommending SNF. At this time pt would not be able to tolerate the intensity of CIR. At this time, we are recommending Skilled Nursing Facility.  Lutricia Horsfall 10/29/2011, 11:08 AM  I can be reached at [phone].

## 2011-10-29 NOTE — Clinical Social Work Note (Signed)
Clinical Social Worker spoke with patient wife at length at the bedside.  Patient wife remains adamant that she is going to take the patient home with home health following.  Patient and patient wife have denied SNF search at this time.  Patient wife understanding that patient discharge could be any day and is prepared for patient to return home.  Patient wife is willing to hire private duty for a couple hours a day for assistance as needed and is agreeable to home health social worker for placement needs from the home if needed.  Patient still with mild confusion but able to understand once wife further explained social work role.  Patient does not drink any alcohol or use any drugs.  SBIRT complete.  No resources needed at this time.  CSW will continue to follow up for emotional support and discharge planning needs.  Macario Golds, [place] [phone]

## 2011-10-30 DIAGNOSIS — IMO0002 Reserved for concepts with insufficient information to code with codable children: Secondary | ICD-10-CM

## 2011-10-30 DIAGNOSIS — S329XXA Fracture of unspecified parts of lumbosacral spine and pelvis, initial encounter for closed fracture: Secondary | ICD-10-CM

## 2011-10-30 LAB — GLUCOSE, CAPILLARY: Glucose-Capillary: 155 mg/dL — ABNORMAL HIGH (ref 70–99)

## 2011-10-30 LAB — SYNOVIAL CELL COUNT + DIFF, W/ CRYSTALS

## 2011-10-30 MED ORDER — DSS 100 MG PO CAPS: 200.0000 mg | ORAL_CAPSULE | Freq: Two times a day (BID) | ORAL | Status: DC

## 2011-10-30 MED ORDER — INSULIN GLARGINE 100 UNIT/ML ~~LOC~~ SOLN: 10.0000 [IU] | Freq: Every day | SUBCUTANEOUS | Status: DC

## 2011-10-30 MED ORDER — CLONAZEPAM 0.5 MG PO TABS: 0.5000 mg | ORAL_TABLET | Freq: Every day | ORAL | Status: DC

## 2011-10-30 MED ORDER — OXYCODONE HCL 15 MG PO TB12: 15.0000 mg | ORAL_TABLET | Freq: Two times a day (BID) | ORAL | Status: DC

## 2011-10-30 MED ORDER — INSULIN ASPART 100 UNIT/ML ~~LOC~~ SOLN: 0.0000 [IU] | Freq: Three times a day (TID) | SUBCUTANEOUS | Status: DC

## 2011-10-30 MED ORDER — NEBIVOLOL HCL 5 MG PO TABS: 2.5000 mg | ORAL_TABLET | Freq: Every day | ORAL | Status: DC

## 2011-10-30 MED ORDER — HYDROCODONE-ACETAMINOPHEN 5-325 MG PO TABS: 0.5000 | ORAL_TABLET | ORAL | Status: DC | PRN

## 2011-10-30 MED ORDER — POLYETHYLENE GLYCOL 3350 17 G PO PACK: 17.0000 g | PACK | Freq: Every day | ORAL | Status: DC

## 2011-10-30 MED ORDER — BISACODYL 5 MG PO TBEC: 10.0000 mg | DELAYED_RELEASE_TABLET | Freq: Every day | ORAL | Status: DC

## 2011-10-30 MED ORDER — DOCUSATE SODIUM 100 MG PO CAPS
200.0000 mg | ORAL_CAPSULE | Freq: Two times a day (BID) | ORAL | Status: DC
  Administered 2011-10-30: 200 mg via ORAL
  Filled 2011-10-30: qty 2

## 2011-10-30 MED ORDER — INSULIN ASPART 100 UNIT/ML ~~LOC~~ SOLN: 0.0000 [IU] | Freq: Every day | SUBCUTANEOUS | Status: DC

## 2011-10-30 MED ORDER — ENSURE PUDDING PO PUDG: 1.0000 | Freq: Three times a day (TID) | ORAL | Status: DC

## 2011-10-30 MED ORDER — HYDROMORPHONE HCL PF 1 MG/ML IJ SOLN: 0.5000 mg | INTRAMUSCULAR | Status: DC | PRN

## 2011-10-30 MED ORDER — POLYETHYLENE GLYCOL 3350 17 G PO PACK
17.0000 g | PACK | Freq: Every day | ORAL | Status: DC
  Filled 2011-10-30: qty 1

## 2011-10-30 MED ORDER — INSULIN GLARGINE 100 UNIT/ML ~~LOC~~ SOLN
10.0000 [IU] | Freq: Every day | SUBCUTANEOUS | Status: DC
  Administered 2011-10-30: 10 [IU] via SUBCUTANEOUS

## 2011-10-30 MED ORDER — TRAMADOL HCL 50 MG PO TABS: 100.0000 mg | ORAL_TABLET | Freq: Four times a day (QID) | ORAL | Status: DC

## 2011-10-30 MED ORDER — OXYCODONE HCL 15 MG PO TB12
15.0000 mg | ORAL_TABLET | Freq: Two times a day (BID) | ORAL | Status: DC
  Administered 2011-10-30: 15 mg via ORAL
  Filled 2011-10-30: qty 1

## 2011-10-30 MED ORDER — BISACODYL 5 MG PO TBEC
10.0000 mg | DELAYED_RELEASE_TABLET | Freq: Every day | ORAL | Status: DC
  Administered 2011-10-30: 10 mg via ORAL
  Filled 2011-10-30: qty 1

## 2011-10-30 NOTE — Clinical Social Work Note (Signed)
Clinical Social Work Department CLINICAL SOCIAL WORK PLACEMENT NOTE 10/30/2011  Patient:  Paul Odonnell, Paul Odonnell  Account Number:  [id number] Admit date:  10/25/2011  Clinical Social Worker:  Macario Golds, Theresia Majors  Date/time:  10/30/2011 11:00 AM  Clinical Social Work is seeking post-discharge placement for this patient at the following level of care:   SKILLED NURSING   (*CSW will update this form in Epic as items are completed)   10/30/2011  Patient/family provided with [facility] Department of Clinical Social Work's list of facilities offering this level of care within the geographic area requested by the patient (or if unable, by the patient's family).  10/30/2011  Patient/family informed of their freedom to choose among providers that offer the needed level of care, that participate in Medicare, Medicaid or managed care program needed by the patient, have an available bed and are willing to accept the patient.  10/30/2011  Patient/family informed of [facility]' ownership interest in [facility], as well as of the fact that they are under no obligation to receive care at this facility.  PASARR submitted to EDS on 10/30/2011 PASARR number received from EDS on 10/30/2011  FL2 transmitted to all facilities in geographic area requested by pt/family on  10/30/2011 FL2 transmitted to all facilities within larger geographic area on   Patient informed that his/her managed care company has contracts with or will negotiate with  certain facilities, including the following:     Patient/family informed of bed offers received:  10/30/2011 Patient chooses bed at [facility], [place] Physician recommends and patient chooses bed at    [facility] [place] on  10/30/2011 Patient to be transferred to facility by ambulance  The following physician request were entered in Epic:   Additional Comments: CSW received referral and  discharged same day to [facility] per family request  Macario Golds, LCSW [phone]

## 2011-10-30 NOTE — Progress Notes (Signed)
Patient ID: Paul Odonnell, male   DOB: [birth year], 78 y.o.   MRN: [id number]   LOS: 5 days   Subjective: Doing ok this morning. Wife says he did well with confusion yesterday until about 2130. Slept well.  Objective: Vital signs in last 24 hours: Temp:  [98.1 F (36.7 C)-99.7 F (37.6 C)] 98.1 F (36.7 C) (10/08 0700) Pulse Rate:  [62-71] 62  (10/08 0700) Resp:  [16-18] 16  (10/08 0700) BP: (141-167)/(57-94) 167/63 mmHg (10/08 0700) SpO2:  [95 %-98 %] 98 % (10/08 0700) Last BM Date:  (prior to admission)  Lab Results:  CBG (last 3)   Basename 10/30/11 0655 10/29/11 2151 10/29/11 1721  GLUCAP 155* 183* 124*     General appearance: alert and no distress Resp: clear to auscultation bilaterally Cardio: regular rate and rhythm GI: normal findings: bowel sounds normal and soft, non-tender   Assessment/Plan: Blunt chest/abd trauma  Multiple bilateral rib fxs w/pneumomediastinum -- No prn's used yesterday or overnight. Will decrease oxycontin further.  Left adrenal hemorrhage  Left iliac fx -- WBAT - PT/OT.  Right knee effusion -- Dr. Ranell Patrick to aspirate/inject this morning. Acute on chronic anemia -- Stable  Multiple medical problems -- Chronic pain and narcotic dependence may make pain control a challenge though pt looks fairly comfortable right now.  DM -- Control could be a little better. Will add Lantus. FEN -- Oxycontin to 15mg  bid. Increase bowel regimen. VTE -- SCD's; hold chemical vte secondary to ABL anemia  Dispo -- PT recommending SNF though wife would like to avoid that if possible. Told her we would continue to work with PT while we're looking for facility in hopes that he will improve to the point where he could go home.    Freeman Caldron, PA-C Pager: [phone] General Trauma PA Pager: [phone]   10/30/2011

## 2011-10-30 NOTE — Discharge Summary (Signed)
Sanii Kukla, MD, MPH, FACS Pager: [phone]  

## 2011-10-30 NOTE — Clinical Social Work Note (Signed)
Clinical Social Worker spoke with patient and patient wife at bedside to confirm patient plans at discharge.  Per PA, patient and patient wife now agreeable to SNF placement.  CSW spoke with both patient and patient wife who are agreeable to SNF search in [place] with preference to [facility] in [place].  CSW completed FL2 and initiated fax out process.  CSW spoke with [facility] admission over the phone who will review information and notify if able to extend bed offer.  CSW to follow up with patient and patient wife regarding plans at discharge.  Clinical Social Worker remains available for emotional support and discharge planning needs.  Macario Golds, [place] [phone]

## 2011-10-30 NOTE — Clinical Social Work Note (Signed)
Clinical Social Worker facilitated patient discharge including contacting facility and confirming plans with patient wife at bedside.  Patient and patient wife were hopeful for another day in hospital, however CSW explained the reasoning and importance for patient discharge and they agreed.  CSW arranged ambulance transport to [facility] [place] through [facility].  CSW to provide phone number to RN to give report prior to patient discharge.  Clinical Social Worker will sign off for now as social work intervention is no longer needed. Please consult [place] again if new need arises.  Macario Golds, [place] [phone]

## 2011-10-30 NOTE — Progress Notes (Signed)
Appreciate Dr. Ranell Patrick treating his knee effusion.  Continue with therapies.  Decrease scheduled narcotics due to confusion.  SL IV. Patient examined and I agree with the assessment and plan  Violeta Gelinas, MD, MPH, FACS Pager: [phone]  10/30/2011 9:40 AM

## 2011-10-30 NOTE — Clinical Documentation Improvement (Signed)
CHANGE MENTAL STATUS DOCUMENTATION CLARIFICATION   THIS DOCUMENT IS NOT A PERMANENT PART OF THE MEDICAL RECORD  TO RESPOND TO THE THIS QUERY, FOLLOW THE INSTRUCTIONS BELOW:  1. If needed, update documentation for the patient's encounter via the notes activity.  2. Access this query again and click edit on the In [employer].  3. After updating, or not, click F2 to complete all highlighted (required) fields concerning your review. Select "additional documentation in the medical record" OR "no additional documentation provided".  4. Click Sign note button.  5. The deficiency will fall out of your In Basket *Please let us know if you are not able to complete this workflow by phone or e-mail (listed below).         10/30/11  Dear Freeman Caldron P.A., Marton Redwood  In an effort to better capture your patient's severity of illness, reflect appropriate length of stay and utilization of resources, a review of the patient medical record has revealed the following indicators.    Based on your clinical judgment, please clarify and document in a progress note and/or discharge summary the clinical condition associated with the following supporting information:  In responding to this query please exercise your independent judgment.  The fact that a query is asked, does not imply that any particular answer is desired or expected.  Per notes patient with "fair amount of confusion and hallucination" with H/H 7.8/23.1,  h/o narcotic dependence due to chronic pain. Patient transfused 1 unit PRBC, Oxycontin dose changed, chest xray "streaky bibasilar atelectasis".   If possible please more specific description of patient's condition at time,  please document in progress notes.  Thank you    Possible Clinical Conditions?   Encephalopathy   (  Metabolic  or  Toxic)  Drug induced confusion/delirium  Acute confusion  Acute delirium  Auditory or Tactile or Visual hallucinations  Other  Condition  Cannot Clinically Determine         Risk Factors: mx broken ribs s/p fall, acute on chronic anemia, h/o chronic narcotic dependence, dm, age   Signs & Symptoms:  "confusion and hallucinations"  H/H 7.8/23.1,  Diagnostics: Radiology: chest xray "streaky bibasilar atelectasis"   Treatment: trf 1 unit PRBC, adjusted dosage of schedule Oxycontin   Reviewed:  no additional documentation provided -- see my note from 10/7  Thank You,  Leonette Most Addison  Clinical Documentation Specialist RN, BSN: Pager [phone] HIM Off # [phone]  Health Information Management [place]

## 2011-10-30 NOTE — Progress Notes (Signed)
Orthopedics Progress Note  Subjective: Recheck left hip and right knee Pt still c/o moderate pain to both the right knee and left hip Possible discharge to inpatient rehab in the next 1-2 days  Objective:  Filed Vitals:   10/30/11 1348  BP: 156/47  Pulse: 62  Temp: 98.2 F (36.8 C)  Resp: 18    General: Awake and alert  Musculoskeletal: left hip with mild edema and mild ecchymosis, nv intact distally Right knee with large effusion, nv intact distally, decreased rom due to effusion Neurovascularly intact  Lab Results  Component Value Date   WBC 7.8 10/29/2011   HGB 7.8* 10/29/2011   HCT 23.1* 10/29/2011   MCV 92.0 10/29/2011   PLT 142* 10/29/2011       Component Value Date/Time   NA 135 10/29/2011 0625   K 4.1 10/29/2011 0625   CL 104 10/29/2011 0625   CO2 22 10/29/2011 0625   GLUCOSE 97 10/29/2011 0625   BUN 21 10/29/2011 0625   CREATININE 1.21 10/29/2011 0625   CALCIUM 8.5 10/29/2011 0625   GFRNONAA 55* 10/29/2011 0625   GFRAA 64* 10/29/2011 0625    Lab Results  Component Value Date   INR 1.07 10/25/2011   INR 1.7* 08/07/2007   INR 1.1 04/02/2007    Assessment/Plan: 1: left iliac wing fracture = stable currently 2: right knee effusion = aspiration and cortisone injection performed today and labs sent Will check on labs later today Agree with inpatient rehab if that is an option Pt feeling better after aspiration and injection  Procedure:  After sterile prep and consent 50ccs of red cloudy fluid aspirated from right knee and pt injected with 4/4/1 marcaine, lidocaine and kenalog into the right knee Ice and rest the knee PT/OT as able   Almedia Balls. Ranell Patrick, MD 10/30/2011 2:05 PM

## 2011-10-30 NOTE — Consult Note (Signed)
Physical Medicine and Rehabilitation Consult Reason for Consult: Multitrauma Referring Physician: Trauma services   HPI: Paul Odonnell is a 78 y.o. right-handed male with history of coronary artery with stent placement, hypertension, diabetes mellitus with peripheral neuropathy as well as chronic pain syndrome. Admitted 10/25/2011 by report patient was trying to jump start his tractor when it started and rolled over him. Was no loss of consciousness. Patient with complaints of left hip pain. Cranial abnormalities.CT scan negative for acute abnormalities. Patient sustained multiple bilateral rib fractures, left adrenal hemorrhage, left iliac wing fracture and tiny pneumomediastinum. CT scan abdomen and pelvis shows comminuted left iliac wing fracture with SI joint being stable, no sacral fracture. Orthopedic services Dr. Devonne Doughty and weightbearing as tolerated and no plan for surgical intervention. Pain control currently with OxyContin sustained-release 15 mg every 12 hours as well as Ultram 100 mg every 6 hours. Patient with large right knee effusion followup orthopedic services with knee injection completed .  Physical therapy evaluation completed an ongoing with recommendations of physical medicine rehabilitation consult to consider inpatient rehabilitation services   Review of Systems  Gastrointestinal: Positive for diarrhea.  Musculoskeletal: Positive for myalgias and back pain.       Bilateral knee pain  Psychiatric/Behavioral: Positive for depression.  All other systems reviewed and are negative.   Past Medical History  Diagnosis Date  . CAD (coronary artery disease)   . MI (myocardial infarction)   . Pancreatitis     with pancreas divisum  . Vitamin B 12 deficiency   . IBS (irritable bowel syndrome)   . HTN (hypertension)   . Depression   . Sepsis due to Pseudomonas     pyelonephritis  . DM (diabetes mellitus)   . Prostate cancer     radiation 2011  . Anemia   . BPH (benign  prostatic hyperplasia)   . Renal insufficiency   . Pure hypercholesterolemia   . Hypothyroidism   . Urinary obstruction   . Osteoarthritis   . GERD (gastroesophageal reflux disease)   . Peptic stricture of esophagus   . AAA (abdominal aortic aneurysm)   . Gastritis   . Diverticulosis   . HH (hiatus hernia)    Past Surgical History  Procedure Date  . Coronary angioplasty with stent placement    Family History  Problem Relation Age of Onset  . Heart attack Father   . Diabetes Mother   . Heart attack Brother     x 2 brothers   Social History:  reports that he has quit smoking. He does not have any smokeless tobacco history on file. His alcohol and drug histories not on file. Allergies:  Allergies  Allergen Reactions  . Codeine     Pancreatic problems   . Morphine And Related    Medications Prior to Admission  Medication Sig Dispense Refill  . aspirin 81 MG tablet Take 81 mg by mouth daily.      Marland Kitchen atorvastatin (LIPITOR) 40 MG tablet Take 40 mg by mouth daily.      . clonazePAM (KLONOPIN) 0.5 MG tablet Take 0.5 mg by mouth at bedtime.      Marland Kitchen escitalopram (LEXAPRO) 10 MG tablet Take 10 mg by mouth daily.      Marland Kitchen esomeprazole (NEXIUM) 40 MG capsule Take 40 mg by mouth daily before breakfast.      . levothyroxine (SYNTHROID, LEVOTHROID) 50 MCG tablet Take 50 mcg by mouth daily.      Marland Kitchen oxyCODONE (OXYCONTIN) 20 MG 12 hr tablet  Take 20 mg by mouth every morning.      Marland Kitchen oxyCODONE (OXYCONTIN) 40 MG 12 hr tablet Take 40 mg by mouth at bedtime. Takes 20mg  in AM and 40mg  in PM per wife      . silodosin (RAPAFLO) 8 MG CAPS capsule Take 8 mg by mouth daily with breakfast.        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 Functional Status:  Mobility: Bed Mobility Bed Mobility: Supine to Sit;Sitting - Scoot to [place] of Bed;Sit to Supine;Rolling Right Rolling Right: 2: Max assist Supine to Sit: 4: Min assist Supine to Sit: Patient Percentage: 40% Sitting - Scoot to Edge of Bed: 5: Supervision Sit to Supine: 1: +1 Total assist;HOB elevated Sit to Supine: Patient Percentage: 20% Scooting to HOB: 1: +1 Total assist Transfers Transfers: Sit to Stand;Stand to Sit Sit to Stand: 1: +2 Total assist;From bed Sit to Stand: Patient Percentage: 70% Stand to Sit: 1: +2 Total assist;To chair/3-in-1 Stand to Sit: Patient Percentage: 70% Stand Pivot Transfers: Not tested (comment) Stand Pivot Transfers: Patient Percentage: 60% Ambulation/Gait Ambulation/Gait Assistance: 3: Mod assist Ambulation Distance (Feet): 90 Feet Assistive device: Rolling walker Ambulation/Gait Assistance Details: Patient able to ambulate with Mod A for balance and support with RW. Patient with heavy right side lean especially as ambulation increased. Patient intitally with adducted gait but improved as ambulation increased and with cueing. Patient relying heaviliy on supprt of RW Gait Pattern: Decreased stride length;Step-through pattern;Decreased step length - right;Decreased step length - left;Antalgic;Narrow base of support Gait velocity: decreased    ADL: ADL Grooming: Simulated;Moderate assistance Where Assessed - Grooming: Supported sitting Upper Body Bathing: Simulated;Moderate assistance Where Assessed - Upper Body Bathing: Supported sitting Lower Body Bathing: Simulated;+1 Total assistance Where Assessed - Lower Body Bathing: Rolling right and/or  left;Supine, head of bed up Upper Body Dressing: Simulated;Maximal assistance Where Assessed - Upper Body Dressing: Supported sitting Lower Body Dressing: Simulated;+1 Total assistance Where Assessed - Lower Body Dressing: Rolling right and/or left;Supine, head of bed up Toilet Transfer: Other (comment) (pt refused) Transfers/Ambulation Related to ADLs: +2 A. Uncooperative. Agitated ADL Comments: A needed due to pain and confusion.  Cognition: Cognition Arousal/Alertness: Awake/alert Orientation Level: Oriented to person;Oriented to place;Disoriented to situation;Disoriented to time Cognition Overall Cognitive Status: Appears within functional limits for tasks assessed/performed Area of Impairment: Memory;Safety/judgement;Awareness of deficits;Problem solving Arousal/Alertness: Awake/alert Orientation Level: Appears intact for tasks assessed Behavior During Session: [facility] for tasks performed Current Attention Level: Focused Memory: Decreased recall of precautions Memory Deficits: Difficulty recalling events of yesterday. Following Commands: Follows one step commands inconsistently Safety/Judgement: Decreased safety judgement for tasks assessed Awareness of Errors: Assistance required to identify errors made;Assistance required to correct errors made Problem Solving: Difficulty processing steps of transfer  Blood pressure 167/63, pulse 62, temperature 98.1 F (36.7 C), temperature source Oral, resp. rate 16, height 5' 11.5" (1.816 m), weight 69 kg (152 lb 1.9 oz), SpO2 98.00%. Physical Exam  Vitals reviewed. Constitutional: He is oriented to person, place, and time.  HENT:  Head: Normocephalic.  Eyes:  Pupils round and reactive to light  Neck: Neck supple. No thyromegaly present.  Cardiovascular: Normal rate and regular rhythm.   Pulmonary/Chest: Breath sounds normal. No respiratory distress. He has no wheezes.  Abdominal: Soft. Bowel sounds are normal. He exhibits no  distension. There is no tenderness.  Neurological: He is alert and oriented to person, place, and time.       Poor insight and memory. Difficulty with processing.  Skin: Skin is warm and dry.       Bruising over left flank. Very tender to touch.  Psychiatric: He has a normal mood and affect.    Results for orders placed during the hospital encounter of 10/25/11 (from the past 24 hour(s))  GLUCOSE, CAPILLARY     Status: Abnormal   Collection Time   10/29/11  5:21 PM      Component Value Range   Glucose-Capillary 124 (*) 70 - 99 mg/dL   Comment 1 Notify RN    GLUCOSE, CAPILLARY     Status: Abnormal   Collection Time   10/29/11  9:51 PM      Component Value Range   Glucose-Capillary 183 (*) 70 - 99 mg/dL  GLUCOSE, CAPILLARY     Status: Abnormal   Collection Time   10/30/11  6:55 AM      Component Value Range   Glucose-Capillary 155 (*) 70 - 99 mg/dL  GLUCOSE, CAPILLARY     Status: Abnormal   Collection Time   10/30/11  8:11 AM      Component Value Range   Glucose-Capillary 137 (*) 70 - 99 mg/dL   Comment 1 Notify RN    GLUCOSE, CAPILLARY     Status: Abnormal   Collection Time   10/30/11 11:56 AM      Component Value Range   Glucose-Capillary 142 (*) 70 - 99 mg/dL   Comment 1 Notify RN     Dg Chest Port 1 View  10/29/2011  *RADIOLOGY REPORT*  Clinical Data: Chest trauma.  PORTABLE CHEST - 1 VIEW  Comparison: 10/27/2011.  Findings: The cardiac silhouette, mediastinal and hilar contours are stable.  The lungs demonstrate streaky bibasilar atelectasis. No definite right-sided pneumothorax.  Stable rib fractures.  IMPRESSION:  1.  Persistent streaky bibasilar atelectasis with low lung volumes. 2.  No pneumothorax.   Original Report Authenticated By: P. Loralie Champagne, M.D.     Assessment/Plan: Diagnosis: multiple trauma including numerous rib and pelvic fx's 1. Does the need for close, 24 hr/day medical supervision in concert with the patient's rehab needs make it unreasonable for this  patient to be served in a less intensive setting? No 2. Co-Morbidities requiring supervision/potential complications: see above 3. Due to bladder management, bowel management, safety, skin/wound care, disease management, medication administration, pain management and patient education, does the patient require 24 hr/day rehab nursing? No 4. Does the patient require coordinated care of a physician, rehab nurse, PT (1-2 hrs/day, 5 days/week) and OT (1-2 hrs/day, 5 days/week) to address physical and functional deficits in the context of the above medical diagnosis(es)? No Addressing deficits in the following areas: balance, endurance, locomotion, strength, transferring, bowel/bladder control, bathing, dressing, feeding, grooming and toileting 5. Can the patient actively participate in an intensive therapy program of at least 3 hrs of therapy per day at least 5 days per week? No and Potentially 6. The potential for patient to make measurable gains while on inpatient rehab is fair 7. Anticipated functional outcomes upon discharge from inpatient rehab are n/a with PT,  n/a with OT, n/a with SLP. 8. Estimated rehab length of stay to reach the above functional goals is: n/a 9. Does the patient have adequate social supports to accommodate these discharge functional goals? Potentially 10. Anticipated D/C setting: Home 11. Anticipated post D/C treatments: HH therapy 12. Overall Rehab/Functional Prognosis: good  RECOMMENDATIONS: This patient's condition is appropriate for continued rehabilitative care in the following setting: SNF Patient has agreed to participate in recommended program. Yes Note that insurance prior authorization may be required for reimbursement for recommended care.  Comment:   Ivory Broad, mD    10/30/2011

## 2011-10-30 NOTE — Progress Notes (Addendum)
Physical Therapy Treatment Patient Details Name: Paul Odonnell MRN: [id number] DOB: 01-Sep-1930 Today's Date: 10/30/2011 Time: 5621-3086 PT Time Calculation (min): 26 min  PT Assessment / Plan / Recommendation Comments on Treatment Session  Patient able to start ambulating today and tolerating increased therapy. Per reports, patients confusion has worn off and this was evident in his participation today. Will recommend another CIR consult as patient is showing signs for good potential with rehab    Follow Up Recommendations  Post acute inpatient     Does the patient have the potential to tolerate intense rehabilitation  Yes, Recommend IP Rehab Screening  Barriers to Discharge        Equipment Recommendations  Rolling walker with 5" wheels;3 in 1 bedside comode;Tub/shower seat    Recommendations for Other Services    Frequency Min 5X/week   Plan Discharge plan remains appropriate;Frequency remains appropriate    Precautions / Restrictions Precautions Precautions: Fall Restrictions RLE Weight Bearing: Weight bearing as tolerated LLE Weight Bearing: Weight bearing as tolerated   Pertinent Vitals/Pain     Mobility  Bed Mobility Supine to Sit: 4: Min assist Sitting - Scoot to Edge of Bed: 5: Supervision Details for Bed Mobility Assistance: Patient able to sit up with min A for patient to pull on therapist hand. Patient able to scoot to EOB with cueing and required less time with bed mobility this session Transfers Sit to Stand: 1: +2 Total assist;From bed Sit to Stand: Patient Percentage: 70% Stand to Sit: 1: +2 Total assist;To chair/3-in-1 Stand to Sit: Patient Percentage: 70% Details for Transfer Assistance: Patient given cues for safe technique. A required to initiate stand and to ensure patient able to put weight through LLE. Patient with right side lean. Cues to sit slowly  Ambulation/Gait Ambulation/Gait Assistance: 3: Mod assist Ambulation Distance (Feet): 90  Feet Assistive device: Rolling walker Ambulation/Gait Assistance Details: Patient able to ambulate with Mod A for balance and support with RW. Patient with heavy right side lean especially as ambulation increased. Patient intitally with adducted gait but improved as ambulation increased and with cueing. Patient relying heaviliy on supprt of RW Gait Pattern: Decreased stride length;Step-through pattern;Decreased step length - right;Decreased step length - left;Antalgic;Narrow base of support Gait velocity: decreased    Exercises     PT Diagnosis:    PT Problem List:   PT Treatment Interventions:     PT Goals Acute Rehab PT Goals PT Goal: Supine/Side to Sit - Progress: Progressing toward goal PT Goal: Sit to Stand - Progress: Progressing toward goal PT Goal: Stand to Sit - Progress: Progressing toward goal PT Transfer Goal: Bed to Chair/Chair to Bed - Progress: Progressing toward goal PT Goal: Ambulate - Progress: Progressing toward goal  Visit Information  Last PT Received On: 10/30/11 Assistance Needed: +2    Subjective Data      Cognition  Overall Cognitive Status: Appears within functional limits for tasks assessed/performed Arousal/Alertness: Awake/alert Orientation Level: Appears intact for tasks assessed Behavior During Session: [facility] for tasks performed    Balance     End of Session PT - End of Session Equipment Utilized During Treatment: Gait belt Activity Tolerance: Patient tolerated treatment well Patient left: in chair;with call bell/phone within reach Nurse Communication: Mobility status   GP     Fredrich Birks 10/30/2011, 12:00 PM 10/30/2011 Fredrich Birks PTA [phone] pager [phone] office

## 2011-10-30 NOTE — Discharge Summary (Signed)
Physician Discharge Summary  Patient ID: Paul Odonnell MRN: [id number] DOB/AGE: [birth year] 76 y.o.  Admit date: 10/25/2011 Discharge date: 10/30/2011  Discharge Diagnoses Patient Active Problem List   Diagnosis Date Noted  . Blunt abdominal trauma 10/26/2011  . Blunt chest trauma 10/26/2011  . Acute blood loss anemia 10/26/2011  . Chronic anemia 10/26/2011  . Fracture of iliac wing 10/25/2011  . Multiple rib fractures 10/25/2011  . Adrenal hematoma 10/25/2011  . Pneumomediastinum 10/25/2011    Consultants Dr. Malon Kindle for orthopedic surgery   Procedures Right knee aspiration/injection by Alphonsa Overall, PA-C   HPI: Annette Stable was trying to jump start his tractor when it started and rolled over him. He denies LOC. He came in as a level 2 trauma. Initial x-rays showed bilateral rib fractures and a left iliac fracture. He was admitted by the trauma service and orthopedic surgery was consulted.   Hospital Course: Orthopedic surgery recommended weight-bearing as tolerated on his iliac fracture. He was restarted on his home medications and mobilized with physical and occupational therapies. He had some early acute renal failure which corrected with IV fluid. He had some significant acute blood loss anemia and did require transfusion of 1 unit PRBC's. On hospital day #4 he began to suffer from some confusion and agitation. No infectious cause was identified and medications, specifically his Oxycontin, were suspected. Adjunctive pain medications were begun and the narcotics weaned over the next several days. His confusion gradually improved. He was down to 15mg  of Oxycontin twice daily and was not requiring any other narcotic pain medication. It is our suggestion that the facility wean that down to 15mg  qam and then discontinue it over the next 3-4 days if the patient tolerates it. Even though x-rays showed no bony involvement his right knee had a significant effusion. To aid in comfort and  mobilization the above-mentioned procedure was performed on the day of discharge. Therapies recommended skilled nursing facility placement and he was transferred there in good condition for further rehabilitation.      Medication List     As of 10/30/2011  3:12 PM    TAKE these medications         aspirin 81 MG tablet   Take 81 mg by mouth daily.      atorvastatin 40 MG tablet   Commonly known as: LIPITOR   Take 40 mg by mouth daily.      bisacodyl 5 MG EC tablet   Commonly known as: DULCOLAX   Take 2 tablets (10 mg total) by mouth daily.      clonazePAM 0.5 MG tablet   Commonly known as: KLONOPIN   Take 1 tablet (0.5 mg total) by mouth at bedtime.      clonazePAM 0.5 MG tablet   Commonly known as: KLONOPIN   Take 0.5 mg by mouth at bedtime.      DSS 100 MG Caps   Take 200 mg by mouth 2 (two) times daily.      escitalopram 10 MG tablet   Commonly known as: LEXAPRO   Take 10 mg by mouth daily.      esomeprazole 40 MG capsule   Commonly known as: NEXIUM   Take 40 mg by mouth daily before breakfast.      feeding supplement Pudg   Take 1 Container by mouth 3 (three) times daily between meals.      HYDROcodone-acetaminophen 5-325 MG per tablet   Commonly known as: NORCO/VICODIN   Take 0.5-2 tablets by mouth every  4 (four) hours as needed (1/2 tab for mild pain, 1 tab for moderate pain, 2 tabs for severe pain).      insulin aspart 100 UNIT/ML injection   Commonly known as: novoLOG   Inject 0-5 Units into the skin at bedtime.      insulin aspart 100 UNIT/ML injection   Commonly known as: novoLOG   Inject 0-9 Units into the skin 3 (three) times daily with meals.      insulin glargine 100 UNIT/ML injection   Commonly known as: LANTUS   Inject 10 Units into the skin daily.      levothyroxine 50 MCG tablet   Commonly known as: SYNTHROID, LEVOTHROID   Take 50 mcg by mouth daily.      nebivolol 5 MG tablet   Commonly known as: BYSTOLIC   Take 0.5 tablets (2.5 mg  total) by mouth daily.      oxyCODONE 15 MG Tb12   Commonly known as: OXYCONTIN   Take 1 tablet (15 mg total) by mouth every 12 (twelve) hours.      polyethylene glycol packet   Commonly known as: MIRALAX / GLYCOLAX   Take 17 g by mouth daily.      RAPAFLO 8 MG Caps capsule   Generic drug: silodosin   Take 8 mg by mouth daily with breakfast.      traMADol 50 MG tablet   Commonly known as: ULTRAM   Take 2 tablets (100 mg total) by mouth every 6 (six) hours.             Follow-up Information    Follow up with NORRIS,STEVEN R, MD. Call in 2 weeks. [phone])    Contact information:   [facility] [address] [place] [address] [phone]       Call [facility]. (As needed)    Contact information:   [address] [place] [address] [phone]          Discharge planning took >30 minutes.   Signed: Freeman Caldron, PA-C Pager: [phone] [facility] Pager: [phone]  10/30/2011, 3:12 PM

## 2011-10-31 LAB — TYPE AND SCREEN
Antibody Screen: NEGATIVE
Unit division: 0

## 2011-10-31 NOTE — Progress Notes (Signed)
Agree with below note. Tiziana Cislo Helen Whitlow PT, DPT Pager: [phone] 

## 2011-11-02 LAB — BODY FLUID CULTURE: Culture: NO GROWTH

## 2011-12-12 ENCOUNTER — Other Ambulatory Visit: Payer: Self-pay | Admitting: Internal Medicine

## 2011-12-18 ENCOUNTER — Ambulatory Visit
Admission: RE | Admit: 2011-12-18 | Discharge: 2011-12-18 | Disposition: A | Discharge status: Home / Self Care | Payer: Medicare Other | Source: Ambulatory Visit | Attending: Internal Medicine | Admitting: Internal Medicine

## 2012-10-21 IMAGING — US US ABDOMEN COMPLETE
1 series · 13 of 25 positions shown · non-contrast
Comparison: Ultrasound of the kidneys of 11/09/2010 and CT abdomen
pelvis of 05/18/2008

CLINICAL DATA: Weight loss, abdominal pain after eating, elevated
creatinine, history of prostate carcinoma with radiation treatment

COMPLETE ABDOMINAL ULTRASOUND

[Series 1: us abdomen complete · 0.43mm/px · 13 of 96 slices shown]
[im 1/96]
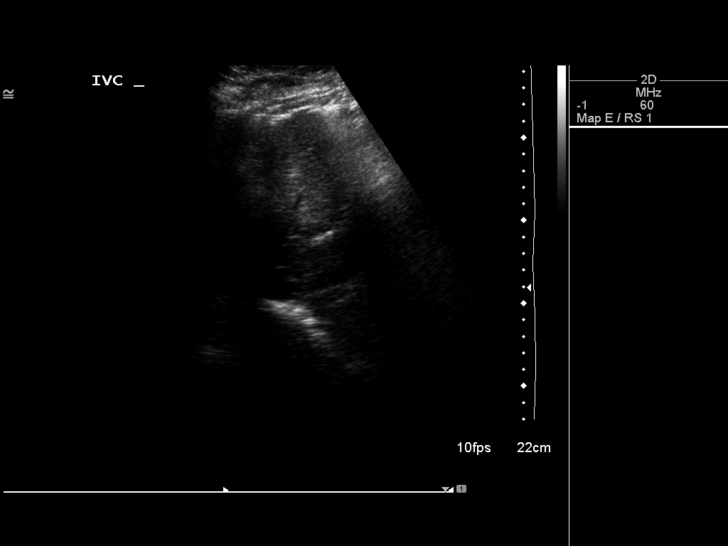
[im 8/96]
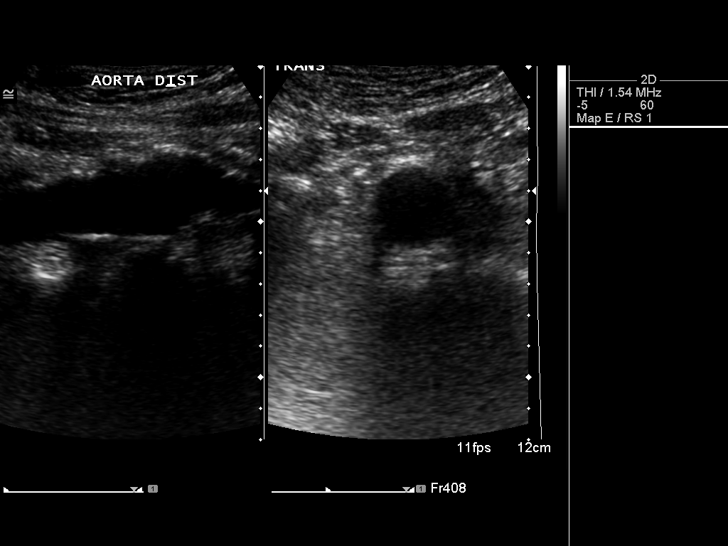
[im 16/96]
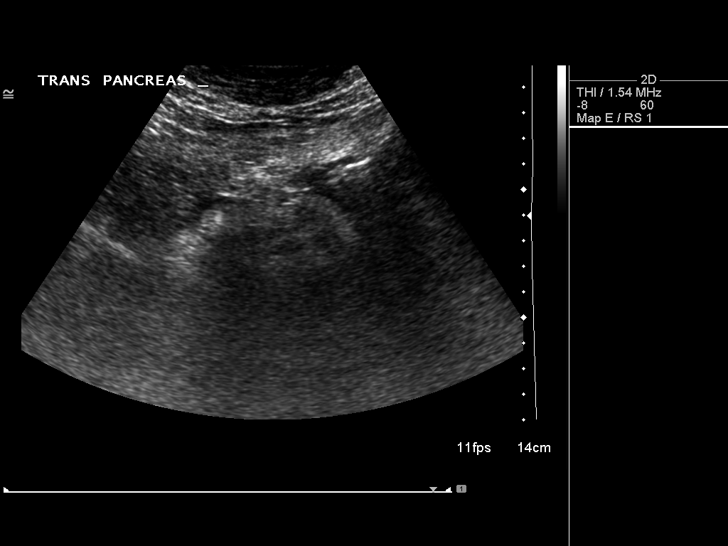
[im 24/96]
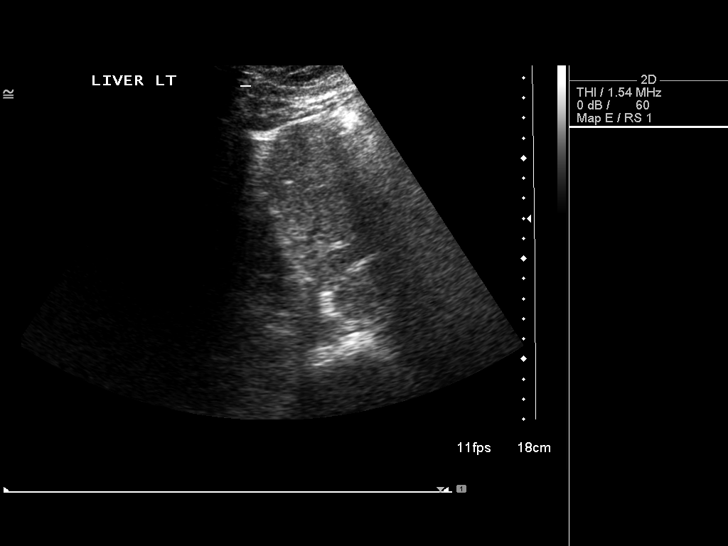
[im 32/96]
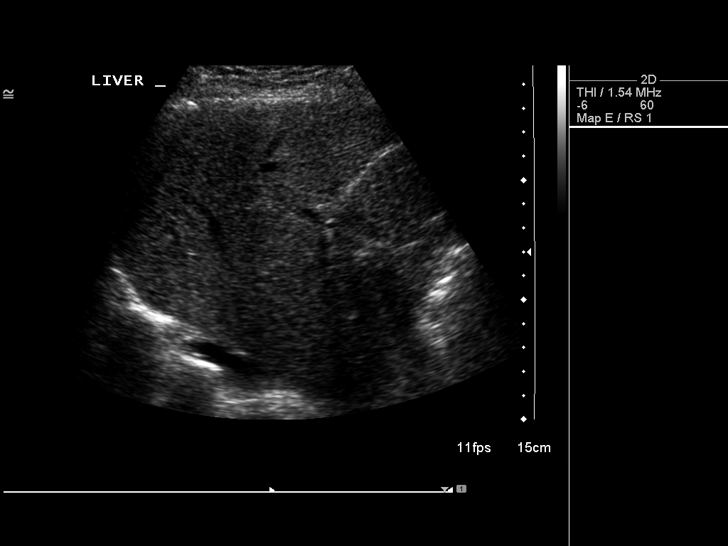
[im 40/96]
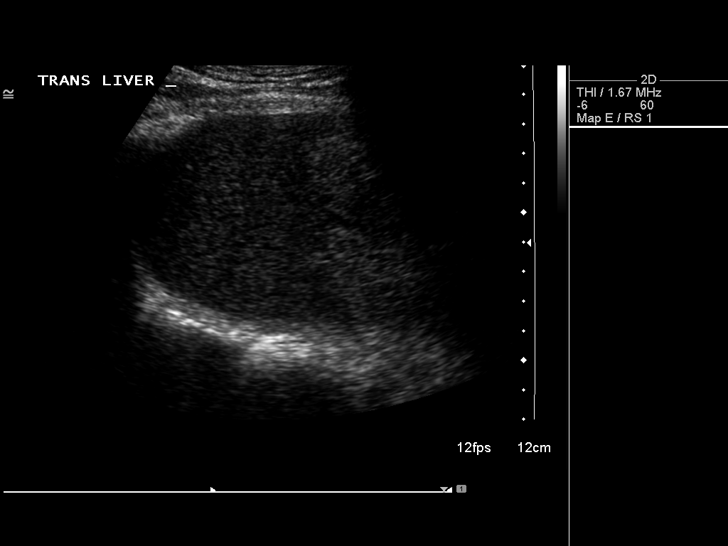
[im 48/96]
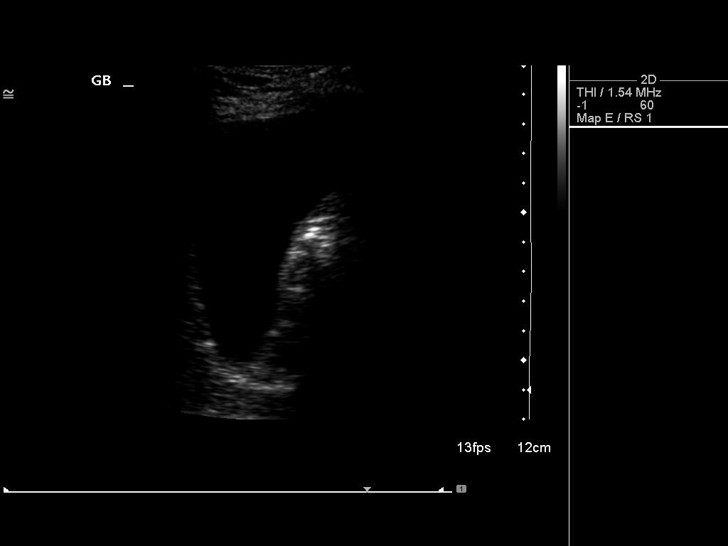
[im 56/96]
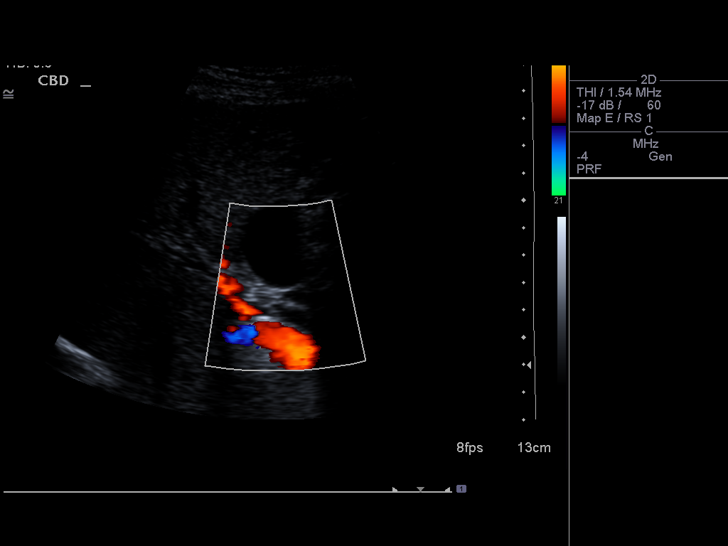
[im 64/96]
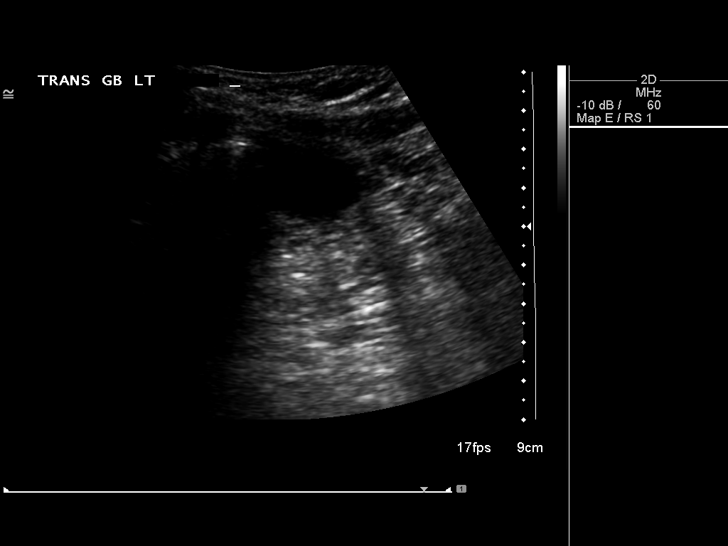
[im 72/96]
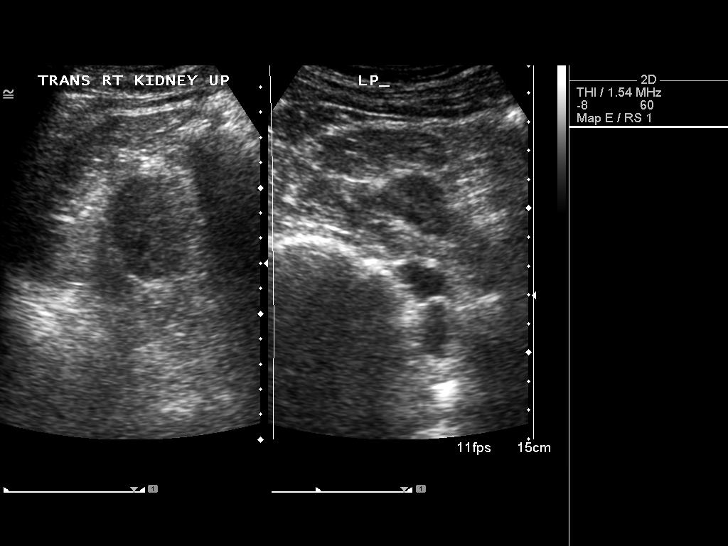
[im 80/96]
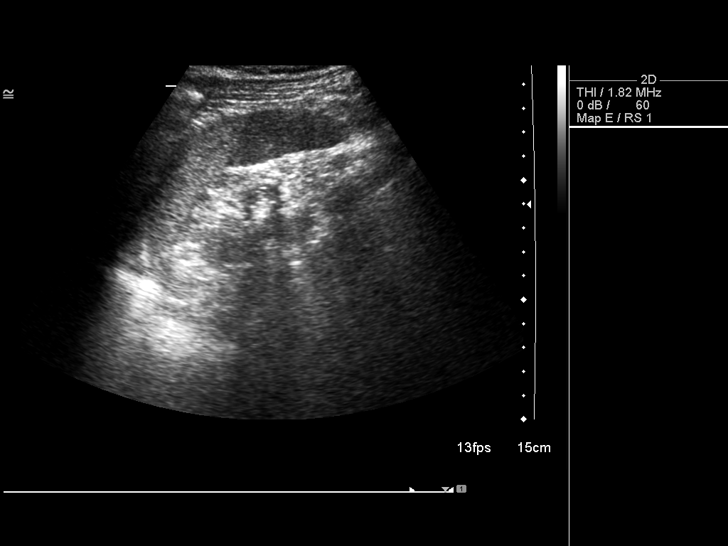
[im 88/96]
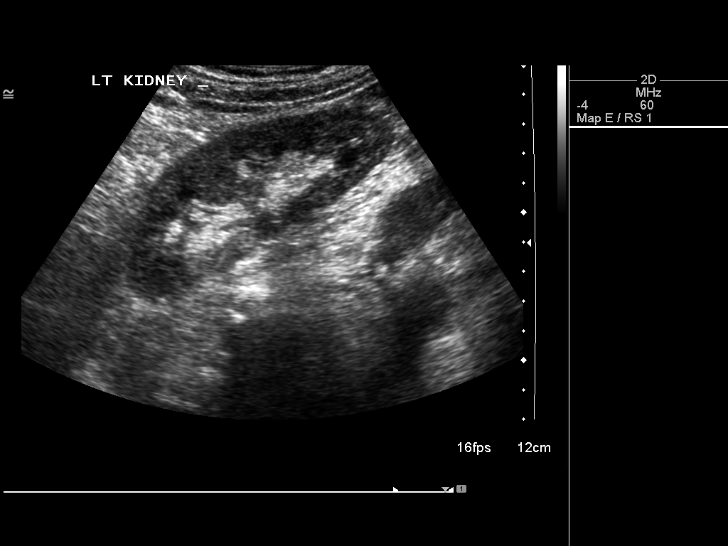
[im 96/96]
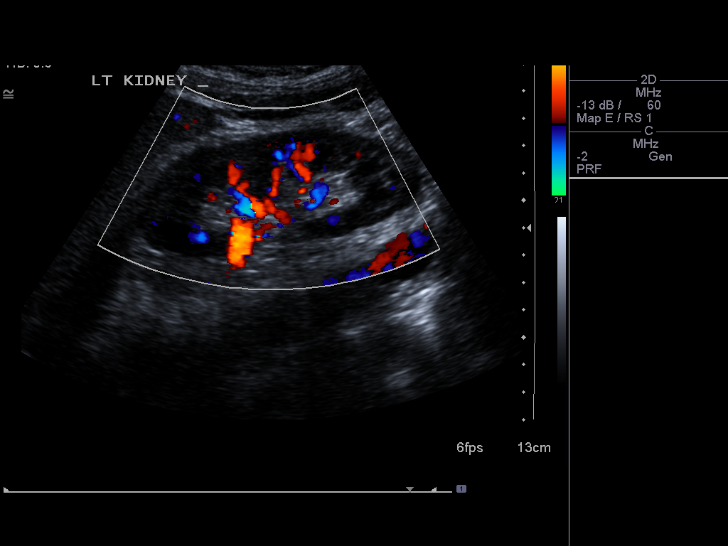

[13 of 25 positions shown; findings below may reference images not displayed]

FINDINGS: Gallbladder:  The gallbladder is visualized and no gallstones are
noted.  There is no pain over the gallbladder with compression.

Common bile duct:  The common bile duct is normal measuring 5.2 mm
in diameter.

Liver:  The liver has a normal echogenic pattern.  No ductal
dilatation is seen.

IVC:  The IVC is unremarkable.

Pancreas:  The pancreas is largely obscured by bowel gas and cannot
be well evaluated.

Spleen:  The spleen is normal measuring 10.4 cm sagittally.

Right Kidney:  No hydronephrosis is seen.  The right kidney
measures 10.8 cm sagittally.

Left Kidney:  No hydronephrosis is noted.  The left kidney measures
10.9 cm.

Abdominal aorta:  There is atheromatous change throughout the
abdominal aorta, and there is an abdominal aortic aneurysm distally
of 3.2 x 3.3 cm over a length of approximately 3.9 cm.
IMPRESSION: 1.  Small distal abdominal aortic aneurysm of 3.2 x 3.3 cm with
atheromatous change diffusely.
2.  No gallstones.
3. The pancreas is partially obscured by bowel gas.

## 2013-01-27 ENCOUNTER — Encounter: Payer: Self-pay | Admitting: Cardiology

## 2013-01-27 ENCOUNTER — Ambulatory Visit (INDEPENDENT_AMBULATORY_CARE_PROVIDER_SITE_OTHER): Payer: Medicare Other | Admitting: Cardiology

## 2013-01-27 VITALS — BP 122/66 | HR 76 | Ht 71.5 in | Wt 159.0 lb

## 2013-01-27 DIAGNOSIS — I10 Essential (primary) hypertension: Secondary | ICD-10-CM

## 2013-01-27 DIAGNOSIS — I2510 Atherosclerotic heart disease of native coronary artery without angina pectoris: Secondary | ICD-10-CM

## 2013-01-27 DIAGNOSIS — I252 Old myocardial infarction: Secondary | ICD-10-CM

## 2013-01-27 DIAGNOSIS — E785 Hyperlipidemia, unspecified: Secondary | ICD-10-CM

## 2013-01-27 NOTE — Patient Instructions (Addendum)
Your physician has recommended you make the following change in your medication:   1. Stop Atorvastatin (Lipitor) for 2 weeks contact us to let us know how you feel after the 2 week period.  Your physician wants you to follow-up in: 6 months with Dr. Dawna Part will receive a reminder letter in the mail two months in advance. If you don't receive a letter, please call our office to schedule the follow-up appointment.
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
## 2013-02-09 ENCOUNTER — Other Ambulatory Visit: Payer: Self-pay

## 2013-02-09 MED ORDER — ATORVASTATIN CALCIUM 40 MG PO TABS: 40.0000 mg | ORAL_TABLET | Freq: Every day | ORAL | Status: DC

## 2013-02-26 ENCOUNTER — Other Ambulatory Visit: Payer: Self-pay | Admitting: *Deleted

## 2013-02-26 MED ORDER — ATORVASTATIN CALCIUM 40 MG PO TABS: 40.0000 mg | ORAL_TABLET | Freq: Every day | ORAL | Status: DC

## 2013-02-26 NOTE — Telephone Encounter (Signed)
Dr Irish Lack had asked patient to stop lipitor for 2 weeks and see how they was felt. Per patients wife, patient feels the same off lipitor so he will continue to take it. Refill sent in per wife's request.

## 2013-07-20 ENCOUNTER — Other Ambulatory Visit: Payer: Self-pay | Admitting: Internal Medicine

## 2013-07-20 DIAGNOSIS — N189 Chronic kidney disease, unspecified: Secondary | ICD-10-CM

## 2013-07-21 ENCOUNTER — Ambulatory Visit
Admission: RE | Admit: 2013-07-21 | Discharge: 2013-07-21 | Disposition: A | Discharge status: Home / Self Care | Payer: Medicare Other | Source: Ambulatory Visit | Attending: Internal Medicine | Admitting: Internal Medicine

## 2013-07-21 DIAGNOSIS — N189 Chronic kidney disease, unspecified: Secondary | ICD-10-CM

## 2013-08-05 ENCOUNTER — Encounter (INDEPENDENT_AMBULATORY_CARE_PROVIDER_SITE_OTHER): Payer: Self-pay

## 2013-08-05 ENCOUNTER — Encounter: Payer: Self-pay | Admitting: Cardiology

## 2013-08-05 ENCOUNTER — Ambulatory Visit (INDEPENDENT_AMBULATORY_CARE_PROVIDER_SITE_OTHER): Payer: Medicare Other | Admitting: Cardiology

## 2013-08-05 VITALS — BP 122/62 | HR 70 | Ht 71.5 in | Wt 158.0 lb

## 2013-08-05 DIAGNOSIS — I2510 Atherosclerotic heart disease of native coronary artery without angina pectoris: Secondary | ICD-10-CM

## 2013-08-05 DIAGNOSIS — I10 Essential (primary) hypertension: Secondary | ICD-10-CM

## 2013-08-05 DIAGNOSIS — I252 Old myocardial infarction: Secondary | ICD-10-CM

## 2013-08-05 DIAGNOSIS — E785 Hyperlipidemia, unspecified: Secondary | ICD-10-CM

## 2013-08-05 NOTE — Patient Instructions (Signed)
Your physician wants you to follow-up in: 1 year with Dr Marlou Porch. (July 2016).  You will receive a reminder letter in the mail two months in advance. If you don't receive a letter, please call our office to schedule the follow-up appointment.
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
## 2013-09-17 ENCOUNTER — Other Ambulatory Visit: Payer: Self-pay

## 2013-09-17 NOTE — Telephone Encounter (Signed)
Looks like 1/2 tablet according to documentation.  May have to call pt to make sure.  No recent lab work

## 2013-09-18 MED ORDER — ATORVASTATIN CALCIUM 40 MG PO TABS: ORAL_TABLET | ORAL | Status: DC

## 2014-03-10 ENCOUNTER — Other Ambulatory Visit: Payer: Self-pay | Admitting: Cardiology

## 2014-04-15 ENCOUNTER — Other Ambulatory Visit: Payer: Self-pay | Admitting: Pain Medicine

## 2014-04-15 ENCOUNTER — Ambulatory Visit
Admission: RE | Admit: 2014-04-15 | Discharge: 2014-04-15 | Disposition: A | Discharge status: Home / Self Care | Payer: Medicare Other | Source: Ambulatory Visit | Attending: Pain Medicine | Admitting: Pain Medicine

## 2014-04-15 DIAGNOSIS — M542 Cervicalgia: Secondary | ICD-10-CM

## 2014-04-15 DIAGNOSIS — M549 Dorsalgia, unspecified: Secondary | ICD-10-CM

## 2014-04-22 ENCOUNTER — Emergency Department (HOSPITAL_COMMUNITY): Payer: Medicare Other

## 2014-04-22 ENCOUNTER — Inpatient Hospital Stay (HOSPITAL_COMMUNITY)
Admission: EM | Admit: 2014-04-22 | Discharge: 2014-04-25 | DRG: 194 | Disposition: A | Discharge status: Home / Self Care | Payer: Medicare Other | Attending: Internal Medicine | Admitting: Internal Medicine

## 2014-04-22 ENCOUNTER — Encounter (HOSPITAL_COMMUNITY): Payer: Self-pay | Admitting: *Deleted

## 2014-04-22 DIAGNOSIS — K861 Other chronic pancreatitis: Secondary | ICD-10-CM | POA: Diagnosis present

## 2014-04-22 DIAGNOSIS — R109 Unspecified abdominal pain: Secondary | ICD-10-CM

## 2014-04-22 DIAGNOSIS — Z7982 Long term (current) use of aspirin: Secondary | ICD-10-CM | POA: Diagnosis not present

## 2014-04-22 DIAGNOSIS — Z8546 Personal history of malignant neoplasm of prostate: Secondary | ICD-10-CM

## 2014-04-22 DIAGNOSIS — D649 Anemia, unspecified: Secondary | ICD-10-CM | POA: Diagnosis present

## 2014-04-22 DIAGNOSIS — R509 Fever, unspecified: Secondary | ICD-10-CM | POA: Diagnosis not present

## 2014-04-22 DIAGNOSIS — K589 Irritable bowel syndrome without diarrhea: Secondary | ICD-10-CM | POA: Diagnosis present

## 2014-04-22 DIAGNOSIS — J189 Pneumonia, unspecified organism: Principal | ICD-10-CM | POA: Diagnosis present

## 2014-04-22 DIAGNOSIS — F329 Major depressive disorder, single episode, unspecified: Secondary | ICD-10-CM | POA: Diagnosis present

## 2014-04-22 DIAGNOSIS — I10 Essential (primary) hypertension: Secondary | ICD-10-CM | POA: Diagnosis present

## 2014-04-22 DIAGNOSIS — E039 Hypothyroidism, unspecified: Secondary | ICD-10-CM | POA: Diagnosis present

## 2014-04-22 DIAGNOSIS — G8929 Other chronic pain: Secondary | ICD-10-CM | POA: Diagnosis present

## 2014-04-22 DIAGNOSIS — N400 Enlarged prostate without lower urinary tract symptoms: Secondary | ICD-10-CM | POA: Diagnosis present

## 2014-04-22 DIAGNOSIS — I2510 Atherosclerotic heart disease of native coronary artery without angina pectoris: Secondary | ICD-10-CM | POA: Diagnosis present

## 2014-04-22 DIAGNOSIS — K219 Gastro-esophageal reflux disease without esophagitis: Secondary | ICD-10-CM | POA: Diagnosis present

## 2014-04-22 DIAGNOSIS — M1990 Unspecified osteoarthritis, unspecified site: Secondary | ICD-10-CM | POA: Diagnosis present

## 2014-04-22 DIAGNOSIS — I252 Old myocardial infarction: Secondary | ICD-10-CM | POA: Diagnosis not present

## 2014-04-22 DIAGNOSIS — Z886 Allergy status to analgesic agent status: Secondary | ICD-10-CM | POA: Diagnosis not present

## 2014-04-22 DIAGNOSIS — F39 Unspecified mood [affective] disorder: Secondary | ICD-10-CM | POA: Diagnosis present

## 2014-04-22 DIAGNOSIS — I129 Hypertensive chronic kidney disease with stage 1 through stage 4 chronic kidney disease, or unspecified chronic kidney disease: Secondary | ICD-10-CM | POA: Diagnosis present

## 2014-04-22 DIAGNOSIS — E780 Pure hypercholesterolemia: Secondary | ICD-10-CM | POA: Diagnosis present

## 2014-04-22 DIAGNOSIS — F17210 Nicotine dependence, cigarettes, uncomplicated: Secondary | ICD-10-CM | POA: Diagnosis present

## 2014-04-22 DIAGNOSIS — Z923 Personal history of irradiation: Secondary | ICD-10-CM

## 2014-04-22 DIAGNOSIS — E119 Type 2 diabetes mellitus without complications: Secondary | ICD-10-CM | POA: Diagnosis present

## 2014-04-22 DIAGNOSIS — N183 Chronic kidney disease, stage 3 (moderate): Secondary | ICD-10-CM | POA: Diagnosis not present

## 2014-04-22 DIAGNOSIS — N189 Chronic kidney disease, unspecified: Secondary | ICD-10-CM | POA: Diagnosis present

## 2014-04-22 LAB — CBC WITH DIFFERENTIAL/PLATELET
Basophils Absolute: 0 10*3/uL (ref 0.0–0.1)
Basophils Relative: 0 % (ref 0–1)
Eosinophils Absolute: 0 10*3/uL (ref 0.0–0.7)
Eosinophils Relative: 0 % (ref 0–5)
HCT: 36.3 % — ABNORMAL LOW (ref 39.0–52.0)
HEMOGLOBIN: 12.5 g/dL — AB (ref 13.0–17.0)
LYMPHS PCT: 7 % — AB (ref 12–46)
Lymphs Abs: 0.8 10*3/uL (ref 0.7–4.0)
MCH: 32.6 pg (ref 26.0–34.0)
MCHC: 34.4 g/dL (ref 30.0–36.0)
MCV: 94.8 fL (ref 78.0–100.0)
MONO ABS: 0.5 10*3/uL (ref 0.1–1.0)
Monocytes Relative: 4 % (ref 3–12)
NEUTROS ABS: 11.2 10*3/uL — AB (ref 1.7–7.7)
Neutrophils Relative %: 89 % — ABNORMAL HIGH (ref 43–77)
Platelets: 167 10*3/uL (ref 150–400)
RBC: 3.83 MIL/uL — ABNORMAL LOW (ref 4.22–5.81)
RDW: 12.9 % (ref 11.5–15.5)
WBC: 12.5 10*3/uL — AB (ref 4.0–10.5)

## 2014-04-22 LAB — COMPREHENSIVE METABOLIC PANEL
ALT: 18 U/L (ref 0–53)
AST: 24 U/L (ref 0–37)
Albumin: 3.7 g/dL (ref 3.5–5.2)
Alkaline Phosphatase: 120 U/L — ABNORMAL HIGH (ref 39–117)
Anion gap: 9 (ref 5–15)
BUN: 18 mg/dL (ref 6–23)
CO2: 24 mmol/L (ref 19–32)
Calcium: 9.3 mg/dL (ref 8.4–10.5)
Chloride: 104 mmol/L (ref 96–112)
Creatinine, Ser: 1.58 mg/dL — ABNORMAL HIGH (ref 0.50–1.35)
GFR calc Af Amer: 45 mL/min — ABNORMAL LOW (ref >90)
GFR calc non Af Amer: 39 mL/min — ABNORMAL LOW (ref >90)
Glucose, Bld: 160 mg/dL — ABNORMAL HIGH (ref 70–99)
Potassium: 3.8 mmol/L (ref 3.5–5.1)
SODIUM: 137 mmol/L (ref 135–145)
TOTAL PROTEIN: 7 g/dL (ref 6.0–8.3)
Total Bilirubin: 0.7 mg/dL (ref 0.3–1.2)

## 2014-04-22 LAB — I-STAT CG4 LACTIC ACID, ED
Lactic Acid, Venous: 1.35 mmol/L (ref 0.5–2.0)
Lactic Acid, Venous: 1.03 mmol/L (ref 0.5–2.0)

## 2014-04-22 MED ORDER — SODIUM CHLORIDE 0.9 % IV SOLN
1000.0000 mL | INTRAVENOUS | Status: DC
Start: 2014-04-22 — End: 2014-04-23

## 2014-04-22 MED ORDER — DEXTROSE 5 % IV SOLN
1.0000 g | Freq: Once | INTRAVENOUS | Status: AC
  Administered 2014-04-22: 1 g via INTRAVENOUS
  Filled 2014-04-22: qty 10

## 2014-04-22 MED ORDER — SODIUM CHLORIDE 0.9 % IV SOLN
1000.0000 mL | Freq: Once | INTRAVENOUS | Status: AC
  Administered 2014-04-22: 1000 mL via INTRAVENOUS

## 2014-04-22 MED ORDER — AZITHROMYCIN 250 MG PO TABS
500.0000 mg | ORAL_TABLET | Freq: Once | ORAL | Status: AC
  Administered 2014-04-22: 500 mg via ORAL
  Filled 2014-04-22: qty 2

## 2014-04-22 MED ORDER — ACETAMINOPHEN 325 MG PO TABS
650.0000 mg | ORAL_TABLET | Freq: Four times a day (QID) | ORAL | Status: DC | PRN
  Administered 2014-04-22: 650 mg via ORAL
  Filled 2014-04-22: qty 2

## 2014-04-22 NOTE — ED Notes (Signed)
Pt reports fever at home of 104. Pt denies any cough, n/v/d, denies any new pain. Pt c/o chronic joint pain.

## 2014-04-22 NOTE — ED Provider Notes (Signed)
CSN: [id number]     Arrival date & time 04/22/14  2108 History   First MD Initiated Contact with Patient 04/22/14 2219     Chief Complaint  Patient presents with  . Fever  . Generalized Body Aches    HPI About one week ago pt started having trouble with bodyaches, myalgias.  No complaints of cough.  No vomiting, diarrhea or sore throat.  Today he started developing chills and fever.  He has been feeling weaker and some shortness of breath.  No vomiting or diarrhea.  Pt has had pneumonia shot, no flu shot. Past Medical History  Diagnosis Date  . CAD (coronary artery disease)   . MI (myocardial infarction)   . Pancreatitis     with pancreas divisum  . Vitamin B 12 deficiency   . IBS (irritable bowel syndrome)   . HTN (hypertension)   . Depression   . Sepsis due to Pseudomonas     pyelonephritis  . DM (diabetes mellitus)   . Prostate cancer     radiation 2011  . Anemia   . BPH (benign prostatic hyperplasia)   . Renal insufficiency   . Pure hypercholesterolemia   . Hypothyroidism   . Urinary obstruction   . Osteoarthritis   . GERD (gastroesophageal reflux disease)   . Peptic stricture of esophagus   . AAA (abdominal aortic aneurysm)   . Gastritis   . Diverticulosis   . HH (hiatus hernia)    Past Surgical History  Procedure Laterality Date  . Coronary angioplasty with stent placement     Family History  Problem Relation Age of Onset  . Heart attack Father   . Diabetes Mother   . Heart attack Brother     x 2 brothers   History  Substance Use Topics  . Smoking status: Current Some Day Smoker  . Smokeless tobacco: Not on file  . Alcohol Use: Not on file    Review of Systems  Constitutional: Positive for fever.  Respiratory: Negative for cough.   Genitourinary: Negative for dysuria.  All other systems reviewed and are negative.     Allergies  Codeine; Dilaudid; and Morphine and related  Home Medications   Prior to Admission medications   Medication Sig  Start Date End Date Taking? Authorizing Provider  aspirin 81 MG tablet Take 81 mg by mouth daily.    Historical Provider, MD  atorvastatin (LIPITOR) 40 MG tablet take 1/2 tablet by mouth once daily 03/12/14   Jerline Pain, MD  clonazePAM (KLONOPIN) 0.5 MG tablet Take 0.5 mg by mouth at bedtime.    Historical Provider, MD  docusate sodium 100 MG CAPS Take 200 mg by mouth 2 (two) times daily. 10/30/11   Lisette Abu, PA-C  escitalopram (LEXAPRO) 10 MG tablet Take 10 mg by mouth daily.    Historical Provider, MD  levothyroxine (SYNTHROID, LEVOTHROID) 50 MCG tablet Take 50 mcg by mouth daily.    Historical Provider, MD  OxyCODONE (OXYCONTIN) 20 mg T12A 12 hr tablet Take 20 mg by mouth every 12 (twelve) hours.    Historical Provider, MD  OxyCODONE (OXYCONTIN) 40 mg T12A 12 hr tablet Take 40 mg by mouth. AT BEDTIME    Historical Provider, MD   BP 135/100 mmHg  Pulse 130  Temp(Src) 102.5 F (39.2 C) (Oral)  Resp 20  Ht 5\' 11"  (1.803 m)  Wt 143 lb (64.864 kg)  BMI 19.95 kg/m2  SpO2 93% Physical Exam  Constitutional: No distress.  HENT:  Head: Normocephalic and atraumatic.  Right Ear: External ear normal.  Left Ear: External ear normal.  Mouth/Throat: No oropharyngeal exudate.  Eyes: Conjunctivae are normal. Right eye exhibits no discharge. Left eye exhibits no discharge. No scleral icterus.  Neck: Neck supple. No tracheal deviation present.  Cardiovascular: Normal rate, regular rhythm and intact distal pulses.   Pulmonary/Chest: Effort normal. No accessory muscle usage or stridor. No respiratory distress. He has no wheezes. He has rales in the right upper field.  Abdominal: Soft. Bowel sounds are normal. He exhibits no distension. There is no tenderness. There is no rebound and no guarding.  Musculoskeletal: He exhibits no edema or tenderness.  Neurological: He is alert. He has normal strength. No cranial nerve deficit (no facial droop, extraocular movements intact, no slurred speech) or  sensory deficit. He exhibits normal muscle tone. He displays no seizure activity. Coordination normal.  Skin: Skin is warm and dry. No rash noted. He is not diaphoretic.  Psychiatric: He has a normal mood and affect.  Nursing note and vitals reviewed.   ED Course  Procedures (including critical care time) Labs Review Labs Reviewed  CBC WITH DIFFERENTIAL/PLATELET - Abnormal; Notable for the following:    WBC 12.5 (*)    RBC 3.83 (*)    Hemoglobin 12.5 (*)    HCT 36.3 (*)    Neutrophils Relative % 89 (*)    Neutro Abs 11.2 (*)    Lymphocytes Relative 7 (*)    All other components within normal limits  COMPREHENSIVE METABOLIC PANEL - Abnormal; Notable for the following:    Glucose, Bld 160 (*)    Creatinine, Ser 1.58 (*)    Alkaline Phosphatase 120 (*)    GFR calc non Af Amer 39 (*)    GFR calc Af Amer 45 (*)    All other components within normal limits  URINALYSIS, ROUTINE W REFLEX MICROSCOPIC - Abnormal; Notable for the following:    Hgb urine dipstick SMALL (*)    All other components within normal limits  COMPREHENSIVE METABOLIC PANEL - Abnormal; Notable for the following:    Glucose, Bld 128 (*)    Creatinine, Ser 1.53 (*)    Total Protein 5.9 (*)    Albumin 3.2 (*)    GFR calc non Af Amer 41 (*)    GFR calc Af Amer 47 (*)    All other components within normal limits  CBC - Abnormal; Notable for the following:    WBC 15.2 (*)    RBC 3.26 (*)    Hemoglobin 10.6 (*)    HCT 31.4 (*)    Platelets 120 (*)    All other components within normal limits  CULTURE, BLOOD (ROUTINE X 2)  CULTURE, BLOOD (ROUTINE X 2)  URINE CULTURE  CULTURE, EXPECTORATED SPUTUM-ASSESSMENT  INFLUENZA PANEL BY PCR (TYPE A & B, H1N1)  STREP PNEUMONIAE URINARY ANTIGEN  URINE MICROSCOPIC-ADD ON  LEGIONELLA ANTIGEN, URINE  I-STAT CG4 LACTIC ACID, ED  I-STAT CG4 LACTIC ACID, ED  I-STAT CG4 LACTIC ACID, ED    Imaging Review Dg Chest 2 View  04/22/2014   CLINICAL DATA:  Shortness of breath,  fever, and body aches.  EXAM: CHEST  2 VIEW  COMPARISON:  10/29/2011  FINDINGS: Airspace disease in the right upper lobe, outlining the minor fissure. No effusion or cavitation.  Normal heart size and stable mild aortic tortuosity.  Remote lateral left rib fractures.  IMPRESSION: Right upper lobe pneumonia.   Electronically Signed   By: Angelica Chessman  Watts M.D.   On: 04/22/2014 21:42      MDM   Final diagnoses:  CAP (community acquired pneumonia)    Pt with leukocytosis , fever and pnx on xrays.  No signs of sepsis at this time.  IV abx started for treatment of CAP.  Admit to hospital for further treatment.  Dorie Rank, MD 04/24/14 [phone]

## 2014-04-23 ENCOUNTER — Encounter (HOSPITAL_COMMUNITY): Payer: Self-pay | Admitting: Surgery

## 2014-04-23 DIAGNOSIS — N189 Chronic kidney disease, unspecified: Secondary | ICD-10-CM

## 2014-04-23 DIAGNOSIS — R109 Unspecified abdominal pain: Secondary | ICD-10-CM

## 2014-04-23 DIAGNOSIS — I10 Essential (primary) hypertension: Secondary | ICD-10-CM

## 2014-04-23 DIAGNOSIS — J189 Pneumonia, unspecified organism: Principal | ICD-10-CM

## 2014-04-23 DIAGNOSIS — D649 Anemia, unspecified: Secondary | ICD-10-CM

## 2014-04-23 DIAGNOSIS — G8929 Other chronic pain: Secondary | ICD-10-CM

## 2014-04-23 LAB — CBC
HCT: 31.4 % — ABNORMAL LOW (ref 39.0–52.0)
Hemoglobin: 10.6 g/dL — ABNORMAL LOW (ref 13.0–17.0)
MCH: 32.5 pg (ref 26.0–34.0)
MCHC: 33.8 g/dL (ref 30.0–36.0)
MCV: 96.3 fL (ref 78.0–100.0)
Platelets: 120 10*3/uL — ABNORMAL LOW (ref 150–400)
RBC: 3.26 MIL/uL — ABNORMAL LOW (ref 4.22–5.81)
RDW: 13 % (ref 11.5–15.5)
WBC: 15.2 10*3/uL — AB (ref 4.0–10.5)

## 2014-04-23 LAB — URINALYSIS, ROUTINE W REFLEX MICROSCOPIC
Bilirubin Urine: NEGATIVE
GLUCOSE, UA: NEGATIVE mg/dL
Ketones, ur: NEGATIVE mg/dL
LEUKOCYTES UA: NEGATIVE
Nitrite: NEGATIVE
PH: 5 (ref 5.0–8.0)
Protein, ur: NEGATIVE mg/dL
Specific Gravity, Urine: 1.01 (ref 1.005–1.030)
Urobilinogen, UA: 0.2 mg/dL (ref 0.0–1.0)

## 2014-04-23 LAB — COMPREHENSIVE METABOLIC PANEL
ALBUMIN: 3.2 g/dL — AB (ref 3.5–5.2)
ALT: 16 U/L (ref 0–53)
AST: 26 U/L (ref 0–37)
Alkaline Phosphatase: 97 U/L (ref 39–117)
Anion gap: 9 (ref 5–15)
BILIRUBIN TOTAL: 1.2 mg/dL (ref 0.3–1.2)
BUN: 19 mg/dL (ref 6–23)
CHLORIDE: 107 mmol/L (ref 96–112)
CO2: 22 mmol/L (ref 19–32)
CREATININE: 1.53 mg/dL — AB (ref 0.50–1.35)
Calcium: 8.5 mg/dL (ref 8.4–10.5)
GFR calc Af Amer: 47 mL/min — ABNORMAL LOW (ref >90)
GFR, EST NON AFRICAN AMERICAN: 41 mL/min — AB (ref >90)
Glucose, Bld: 128 mg/dL — ABNORMAL HIGH (ref 70–99)
Potassium: 4 mmol/L (ref 3.5–5.1)
Sodium: 138 mmol/L (ref 135–145)
Total Protein: 5.9 g/dL — ABNORMAL LOW (ref 6.0–8.3)

## 2014-04-23 LAB — INFLUENZA PANEL BY PCR (TYPE A & B)
H1N1 flu by pcr: NOT DETECTED
INFLBPCR: NEGATIVE
Influenza A By PCR: NEGATIVE

## 2014-04-23 LAB — URINE MICROSCOPIC-ADD ON

## 2014-04-23 LAB — STREP PNEUMONIAE URINARY ANTIGEN: Strep Pneumo Urinary Antigen: NEGATIVE

## 2014-04-23 MED ORDER — OXYCODONE HCL 5 MG PO TABS
10.0000 mg | ORAL_TABLET | ORAL | Status: DC | PRN
  Administered 2014-04-23 – 2014-04-25 (×6): 10 mg via ORAL
  Filled 2014-04-23 (×12): qty 2

## 2014-04-23 MED ORDER — LEVOTHYROXINE SODIUM 50 MCG PO TABS
50.0000 ug | ORAL_TABLET | Freq: Every day | ORAL | Status: DC
  Administered 2014-04-23 – 2014-04-25 (×3): 50 ug via ORAL
  Filled 2014-04-23 (×5): qty 1

## 2014-04-23 MED ORDER — AZITHROMYCIN 500 MG IV SOLR
500.0000 mg | INTRAVENOUS | Status: DC
  Administered 2014-04-23 – 2014-04-24 (×2): 500 mg via INTRAVENOUS
  Filled 2014-04-23 (×3): qty 500

## 2014-04-23 MED ORDER — CEFTRIAXONE SODIUM IN DEXTROSE 20 MG/ML IV SOLN
1.0000 g | INTRAVENOUS | Status: DC
  Administered 2014-04-23 – 2014-04-25 (×2): 1 g via INTRAVENOUS
  Filled 2014-04-23 (×4): qty 50

## 2014-04-23 MED ORDER — ONDANSETRON HCL 4 MG PO TABS: 4.0000 mg | ORAL_TABLET | Freq: Four times a day (QID) | ORAL | Status: DC | PRN

## 2014-04-23 MED ORDER — ASPIRIN EC 81 MG PO TBEC
81.0000 mg | DELAYED_RELEASE_TABLET | Freq: Every day | ORAL | Status: DC
  Administered 2014-04-23 – 2014-04-25 (×3): 81 mg via ORAL
  Filled 2014-04-23 (×3): qty 1

## 2014-04-23 MED ORDER — SODIUM CHLORIDE 0.9 % IV SOLN
INTRAVENOUS | Status: DC
  Administered 2014-04-23 (×2): via INTRAVENOUS

## 2014-04-23 MED ORDER — SODIUM CHLORIDE 0.9 % IJ SOLN
3.0000 mL | Freq: Two times a day (BID) | INTRAMUSCULAR | Status: DC
  Administered 2014-04-23: 3 mL via INTRAVENOUS

## 2014-04-23 MED ORDER — ATORVASTATIN CALCIUM 20 MG PO TABS
20.0000 mg | ORAL_TABLET | Freq: Every day | ORAL | Status: DC
  Administered 2014-04-23 – 2014-04-25 (×3): 20 mg via ORAL
  Filled 2014-04-23 (×3): qty 1

## 2014-04-23 MED ORDER — ESCITALOPRAM OXALATE 10 MG PO TABS
10.0000 mg | ORAL_TABLET | Freq: Every day | ORAL | Status: DC
  Administered 2014-04-23 – 2014-04-25 (×3): 10 mg via ORAL
  Filled 2014-04-23 (×3): qty 1

## 2014-04-23 MED ORDER — ACETAMINOPHEN 650 MG RE SUPP: 650.0000 mg | Freq: Four times a day (QID) | RECTAL | Status: DC | PRN

## 2014-04-23 MED ORDER — ONDANSETRON HCL 4 MG/2ML IJ SOLN: 4.0000 mg | Freq: Four times a day (QID) | INTRAMUSCULAR | Status: DC | PRN

## 2014-04-23 MED ORDER — CLONAZEPAM 0.5 MG PO TABS
0.5000 mg | ORAL_TABLET | Freq: Every day | ORAL | Status: DC
Start: 2014-04-23 — End: 2014-04-25
  Administered 2014-04-23 – 2014-04-24 (×3): 0.5 mg via ORAL
  Filled 2014-04-23 (×3): qty 1

## 2014-04-23 MED ORDER — DOCUSATE SODIUM 100 MG PO CAPS
200.0000 mg | ORAL_CAPSULE | Freq: Two times a day (BID) | ORAL | Status: DC
  Administered 2014-04-23 – 2014-04-25 (×5): 200 mg via ORAL
  Filled 2014-04-23 (×6): qty 2

## 2014-04-23 MED ORDER — HEPARIN SODIUM (PORCINE) 5000 UNIT/ML IJ SOLN
5000.0000 [IU] | Freq: Three times a day (TID) | INTRAMUSCULAR | Status: DC
  Administered 2014-04-23 – 2014-04-25 (×7): 5000 [IU] via SUBCUTANEOUS
  Filled 2014-04-23 (×10): qty 1

## 2014-04-23 MED ORDER — DEXTROSE-NACL 5-0.45 % IV SOLN: INTRAVENOUS | Status: DC

## 2014-04-23 MED ORDER — ACETAMINOPHEN 325 MG PO TABS
650.0000 mg | ORAL_TABLET | Freq: Four times a day (QID) | ORAL | Status: DC | PRN
  Administered 2014-04-24 (×2): 650 mg via ORAL
  Filled 2014-04-23 (×2): qty 2

## 2014-04-23 NOTE — H&P (Signed)
[facility] History and Physical  [name]  MRN: [id number]  DOB: 1931-01-19  DOS: the patient was seen and examined on 04/23/2014 PCP: Tommy Medal, MD  Chief Complaint: Abdominal pain and fever  HPI: Paul Odonnell is a 81 y.o. male with Past medical history of coronary artery disease, chronic pancreatitis, irritable bowel syndrome, hypertension, diabetes mellitus, prostate cancer, chronic abdominal pain since prostate radiation on narcotic regimen, hypothyroidism, GERD. The patient is presenting with complaints of abdominal pain which is chronic. Patient also has complains of fever. The basic reason why the wife brought the patient here was because of fever. Patient has chronic abdominal pain which is unchanged and he is on narcotic regimen with the pain medication center. Patient was on OxyContin 20 mg in the morning and 40 mg in the evening and was recently switched to oxycodone 10 mg in the morning and noon as well as 20 mg the evening and since then he has poor pain control. Patient had some cough today no vomiting no nausea no diarrhea no constipation. No burning urination or leg swelling or leg tenderness. He has been tired and has been sleepy throughout the day. No recent change in his medications.  The patient is coming from home. And at his baseline independent for most of his ADL.  Review of Systems: as mentioned in the history of present illness.  A Comprehensive review of the other systems is negative.  Past Medical History  Diagnosis Date  . CAD (coronary artery disease)   . MI (myocardial infarction)   . Pancreatitis     with pancreas divisum  . Vitamin B 12 deficiency   . IBS (irritable bowel syndrome)   . HTN (hypertension)   . Depression   . Sepsis due to Pseudomonas     pyelonephritis  . DM (diabetes mellitus)   . Prostate cancer     radiation 2011  . Anemia   . BPH (benign prostatic hyperplasia)   . Renal insufficiency   . Pure  hypercholesterolemia   . Hypothyroidism   . Urinary obstruction   . Osteoarthritis   . GERD (gastroesophageal reflux disease)   . Peptic stricture of esophagus   . AAA (abdominal aortic aneurysm)   . Gastritis   . Diverticulosis   . HH (hiatus hernia)    Past Surgical History  Procedure Laterality Date  . Coronary angioplasty with stent placement     Social History:  reports that he has been smoking.  He does not have any smokeless tobacco history on file. His alcohol and drug histories are not on file.  Allergies  Allergen Reactions  . Codeine     Pancreatic problems   . Dilaudid [Hydromorphone Hcl]   . Morphine And Related     Family History  Problem Relation Age of Onset  . Heart attack Father   . Diabetes Mother   . Heart attack Brother     x 2 brothers    Prior to Admission medications   Medication Sig Start Date End Date Taking? Authorizing Provider  oxycodone (OXY-IR) 5 MG capsule Take 10 mg by mouth every 4 (four) hours as needed (10 mg in AM and Noon, 20 mg in PM).   Yes Historical Provider, MD  aspirin 81 MG tablet Take 81 mg by mouth daily.    Historical Provider, MD  atorvastatin (LIPITOR) 40 MG tablet take 1/2 tablet by mouth once daily 03/12/14   Jerline Pain, MD  clonazePAM (KLONOPIN) 0.5 MG tablet Take  0.5 mg by mouth at bedtime.    Historical Provider, MD  docusate sodium 100 MG CAPS Take 200 mg by mouth 2 (two) times daily. 10/30/11   Lisette Abu, PA-C  escitalopram (LEXAPRO) 10 MG tablet Take 10 mg by mouth daily.    Historical Provider, MD  levothyroxine (SYNTHROID, LEVOTHROID) 50 MCG tablet Take 50 mcg by mouth daily.    Historical Provider, MD  OxyCODONE (OXYCONTIN) 20 mg T12A 12 hr tablet Take 20 mg by mouth every 12 (twelve) hours.    Historical Provider, MD  OxyCODONE (OXYCONTIN) 40 mg T12A 12 hr tablet Take 40 mg by mouth. AT BEDTIME    Historical Provider, MD    Physical Exam: Filed Vitals:   04/22/14 2330 04/22/14 2345 04/23/14 0028  04/23/14 0130  BP: 126/64 123/66 130/70   Pulse: 93 90 90   Temp:   99.4 F (37.4 C)   TempSrc:   Oral   Resp: 23 24 21    Height:   5\' 11"  (1.803 m) 5\' 11"  (1.803 m)  Weight:   69.31 kg (152 lb 12.8 oz) 71.487 kg (157 lb 9.6 oz)  SpO2: 92% 93% 96%     General: Alert, Awake and Oriented to Time, Place and Person. Appear in mild distress Eyes: PERRL ENT: Oral Mucosa clear moist. Neck: no JVD Cardiovascular: S1 and S2 Present, no Murmur, Peripheral Pulses Present Respiratory: Bilateral Air entry equal and Decreased, right-sided Crackles, no wheezes Abdomen: Bowel Sound present, Soft and mild tender Skin: no Rash Extremities: no Pedal edema, no calf tenderness Neurologic: Grossly no focal neuro deficit.  Labs on Admission:  CBC:  Recent Labs Lab 04/22/14 2122  WBC 12.5*  NEUTROABS 11.2*  HGB 12.5*  HCT 36.3*  MCV 94.8  PLT 167    CMP     Component Value Date/Time   NA 137 04/22/2014 2122   K 3.8 04/22/2014 2122   CL 104 04/22/2014 2122   CO2 24 04/22/2014 2122   GLUCOSE 160* 04/22/2014 2122   BUN 18 04/22/2014 2122   CREATININE 1.58* 04/22/2014 2122   CALCIUM 9.3 04/22/2014 2122   PROT 7.0 04/22/2014 2122   ALBUMIN 3.7 04/22/2014 2122   AST 24 04/22/2014 2122   ALT 18 04/22/2014 2122   ALKPHOS 120* 04/22/2014 2122   BILITOT 0.7 04/22/2014 2122   GFRNONAA 39* 04/22/2014 2122   GFRAA 45* 04/22/2014 2122    No results for input(s): LIPASE, AMYLASE in the last 168 hours.  No results for input(s): CKTOTAL, CKMB, CKMBINDEX, TROPONINI in the last 168 hours. BNP (last 3 results) No results for input(s): BNP in the last 8760 hours.  ProBNP (last 3 results) No results for input(s): PROBNP in the last 8760 hours.   Radiological Exams on Admission: Dg Chest 2 View  04/22/2014   CLINICAL DATA:  Shortness of breath, fever, and body aches.  EXAM: CHEST  2 VIEW  COMPARISON:  10/29/2011  FINDINGS: Airspace disease in the right upper lobe, outlining the minor fissure.  No effusion or cavitation.  Normal heart size and stable mild aortic tortuosity.  Remote lateral left rib fractures.  IMPRESSION: Right upper lobe pneumonia.   Electronically Signed   By: Monte Fantasia M.D.   On: 04/22/2014 21:42   Assessment/Plan Principal Problem:   CAP (community acquired pneumonia) Active Problems:   Chronic anemia   Coronary artery disease involving native coronary artery of native heart without angina pectoris   Essential hypertension   CKD (chronic kidney disease)  Chronic abdominal pain   1. CAP (community acquired pneumonia) Patient is presenting with compressive cough and fever. He does not have any other symptoms. She has chronic abdominal pain which is unchanged. Chest x-ray shows a right-sided pneumonia. Patient will be treated with ceftriaxone and azithromycin. Follow cultures.  2. Chronic abdominal pain. Unchanged as per family. Currently continue home oral oxycodone scheduled.  3. Chronic kidney disease. Serum creatinine unchanged. Patient has history of prostate cancer. Continue close monitoring.  4. Hypothyroidism. Continue Synthroid.  5. Mood disorder. Continue home medications.  Advance goals of care discussion: Patient wants to be intubated for respiratory arrest but doesn't want to go through CPR  DVT Prophylaxis: subcutaneous Heparin Nutrition: Regular diet  Family Communication: Wife was present at bedside, opportunity was given to ask question and all questions were answered satisfactorily at the time of interview. Disposition: Admitted to npatient in telemetry unit.  Author: Berle Mull, MD [facility] Pager: [phone] 04/23/2014,     If 7PM-7AM, please contact night-coverage [email or web address] Password [email or web address]

## 2014-04-23 NOTE — Care Management Note (Unsigned)
    Page 1 of 1   04/23/2014     3:37:22 PM CARE MANAGEMENT NOTE 04/23/2014  Patient:  Paul Odonnell, Paul Odonnell   Account Number:  [id number]  Date Initiated:  04/23/2014  Documentation initiated by:  Aarvi Stotts  Subjective/Objective Assessment:   Pt adm on 04/22/14 with PNA, fever.  PTA, pt resides at home with spouse.     Action/Plan:   Will follow for dc needs as pt progresses.   Anticipated DC Date:  04/26/2014   Anticipated DC Plan:  [place]  CM consult      Choice offered to / List presented to:             Status of service:  In process, will continue to follow Medicare Important Message given?   (If response is "NO", the following Medicare IM given date fields will be blank) Date Medicare IM given:   Medicare IM given by:   Date Additional Medicare IM given:   Additional Medicare IM given by:    Discharge Disposition:    Per UR Regulation:  Reviewed for med. necessity/level of care/duration of stay  If discussed at [place] of Stay Meetings, dates discussed:    Comments:

## 2014-04-23 NOTE — Progress Notes (Signed)
Pt a/o, no c/o pain, pt afebrile, vss, pt stable

## 2014-04-23 NOTE — Progress Notes (Signed)
Patient transferred from the ED via stretcher to room 3E17. Oriented patient and his wife to the room and room equipment and welcomed and educated them both to the Heart Failure floor. Confirmed telemetry placement of leads with CMT. VSS. Will continue to monitor patient to end of shift.

## 2014-04-23 NOTE — Progress Notes (Signed)
[facility] PROGRESS NOTE   Assessment/Plan: *CAP (community acquired pneumonia) - Started on empiric antibiotic Rocephin and azithromycin, as chest after show right-sided pneumonia. He has remained afebrile. - Leukocytosis is worsening. Recheck in the morning.  CKD (chronic kidney disease) - Serum creatinine at baseline, continue close monitoring.  Essential hypertension - On pressure medications at home.  Chronic anemia: - Seems to be her baseline. Continue to monitor.  Chronic abdominal pain Unchanged per family members continue current home dose of narcotics.    Code Status: Patient wants to be intubated for respiratory arrest  Family Communication: Wife was present at bedside, opportunity was given to ask question and all questions were answered satisfactorily at the time of interview. Disposition: Admitted to npatient in telemetry unit.   Consultants:  none  Procedures:  CXR  Antibiotics:  Rocephin and azithromycin 4.1.2016  HPI/Subjective: Shortness of breath unchanged, feel tired.  Objective: Filed Vitals:   04/23/14 0130 04/23/14 0714 04/23/14 0843 04/23/14 0900  BP:  135/72  116/55  Pulse:  92  82  Temp:  98.6 F (37 C) 98.5 F (36.9 C) 98.2 F (36.8 C)  TempSrc:  Oral  Oral  Resp:  20    Height: 5\' 11"  (1.803 m)     Weight: 71.487 kg (157 lb 9.6 oz)     SpO2:  93%  93%    Intake/Output Summary (Last 24 hours) at 04/23/14 1028 Last data filed at 04/23/14 0841  Gross per 24 hour  Intake 828.33 ml  Output    900 ml  Net -71.67 ml   Filed Weights   04/22/14 2112 04/23/14 0028 04/23/14 0130  Weight: 64.864 kg (143 lb) 69.31 kg (152 lb 12.8 oz) 71.487 kg (157 lb 9.6 oz)    Exam:  General: Alert, awake, oriented x3, in no acute distress.  HEENT: No bruits, no goiter.  Heart: Regular rate and rhythm,  Lungs: Good air movement, crackle son the right lobe. Abdomen: Soft, nontender, nondistended, positive bowel sounds.  Neuro:  Grossly intact, nonfocal.   Data Reviewed: Basic Metabolic Panel:  Recent Labs Lab 04/22/14 2122 04/23/14 0350  NA 137 138  K 3.8 4.0  CL 104 107  CO2 24 22  GLUCOSE 160* 128*  BUN 18 19  CREATININE 1.58* 1.53*  CALCIUM 9.3 8.5   Liver Function Tests:  Recent Labs Lab 04/22/14 2122 04/23/14 0350  AST 24 26  ALT 18 16  ALKPHOS 120* 97  BILITOT 0.7 1.2  PROT 7.0 5.9*  ALBUMIN 3.7 3.2*   No results for input(s): LIPASE, AMYLASE in the last 168 hours. No results for input(s): AMMONIA in the last 168 hours. CBC:  Recent Labs Lab 04/22/14 2122 04/23/14 0350  WBC 12.5* 15.2*  NEUTROABS 11.2*  --   HGB 12.5* 10.6*  HCT 36.3* 31.4*  MCV 94.8 96.3  PLT 167 120*   Cardiac Enzymes: No results for input(s): CKTOTAL, CKMB, CKMBINDEX, TROPONINI in the last 168 hours. BNP (last 3 results) No results for input(s): BNP in the last 8760 hours.  ProBNP (last 3 results) No results for input(s): PROBNP in the last 8760 hours.  CBG: No results for input(s): GLUCAP in the last 168 hours.  No results found for this or any previous visit (from the past 240 hour(s)).   Studies: Dg Chest 2 View  04/22/2014   CLINICAL DATA:  Shortness of breath, fever, and body aches.  EXAM: CHEST  2 VIEW  COMPARISON:  10/29/2011  FINDINGS: Airspace disease  in the right upper lobe, outlining the minor fissure. No effusion or cavitation.  Normal heart size and stable mild aortic tortuosity.  Remote lateral left rib fractures.  IMPRESSION: Right upper lobe pneumonia.   Electronically Signed   By: Monte Fantasia M.D.   On: 04/22/2014 21:42    Scheduled Meds: . aspirin EC  81 mg Oral Daily  . atorvastatin  20 mg Oral Daily  . azithromycin  500 mg Intravenous Q24H  . cefTRIAXone (ROCEPHIN)  IV  1 g Intravenous Q24H  . clonazePAM  0.5 mg Oral QHS  . docusate sodium  200 mg Oral BID  . escitalopram  10 mg Oral Daily  . heparin  5,000 Units Subcutaneous 3 times per day  . levothyroxine  50 mcg  Oral QAC breakfast  . sodium chloride  3 mL Intravenous Q12H   Continuous Infusions: . sodium chloride 100 mL/hr at 04/23/14 0155     Charlynne Cousins  [facility] Pager [phone]. If 7PM-7AM, please contact night-coverage at [email or web address], password [facility] 04/23/2014, 10:28 AM  LOS: 1 day

## 2014-04-23 NOTE — Plan of Care (Signed)
Problem: Phase I Progression Outcomes Goal: Dyspnea controlled at rest Outcome: Progressing Very mild if any shortness of breath but is very weak and tired Goal: Confirm chest x-ray completed Outcome: Completed/Met Date Met:  04/23/14 Confirmed in the ED

## 2014-04-24 DIAGNOSIS — N183 Chronic kidney disease, stage 3 (moderate): Secondary | ICD-10-CM

## 2014-04-24 NOTE — Progress Notes (Signed)
[facility] PROGRESS NOTE   Assessment/Plan: *CAP (community acquired pneumonia) - Started on empiric antibiotic Rocephin and azithromycin, cont IV antibiotics He has remained afebrile. - Recheck in the morning.  CKD (chronic kidney disease) - Serum creatinine at baseline, continue close monitoring.  Essential hypertension - On pressure medications at home.  Chronic anemia: - Seems to be her baseline. Continue to monitor.  Chronic abdominal pain Unchanged per family members continue current home dose of narcotics.    Code Status: Patient wants to be intubated for respiratory arrest  Family Communication: Wife was present at bedside, opportunity was given to ask question and all questions were answered satisfactorily at the time of interview. Disposition: Admitted to npatient in telemetry unit.   Consultants:  none  Procedures:  CXR  Antibiotics:  Rocephin and azithromycin 4.1.2016  HPI/Subjective: No complains.  Objective: Filed Vitals:   04/23/14 1431 04/23/14 2100 04/24/14 0153 04/24/14 0531  BP: 123/81 146/74 133/56 140/75  Pulse: 75 72 79 82  Temp: 97.9 F (36.6 C) 98.2 F (36.8 C) 97.8 F (36.6 C) 98.3 F (36.8 C)  TempSrc: Oral Oral Oral Oral  Resp:  20 18 18   Height:      Weight:    71.124 kg (156 lb 12.8 oz)  SpO2: 95% 96% 96% 97%    Intake/Output Summary (Last 24 hours) at 04/24/14 1011 Last data filed at 04/24/14 1006  Gross per 24 hour  Intake   1994 ml  Output   1675 ml  Net    319 ml   Filed Weights   04/23/14 0028 04/23/14 0130 04/24/14 0531  Weight: 69.31 kg (152 lb 12.8 oz) 71.487 kg (157 lb 9.6 oz) 71.124 kg (156 lb 12.8 oz)    Exam:  General: Alert, awake, oriented x3, in no acute distress.  HEENT: No bruits, no goiter.  Heart: Regular rate and rhythm,  Lungs: Good air movement, crackle son the right lobe. Abdomen: Soft, nontender, nondistended, positive bowel sounds.  Neuro: Grossly intact, nonfocal.   Data  Reviewed: Basic Metabolic Panel:  Recent Labs Lab 04/22/14 2122 04/23/14 0350  NA 137 138  K 3.8 4.0  CL 104 107  CO2 24 22  GLUCOSE 160* 128*  BUN 18 19  CREATININE 1.58* 1.53*  CALCIUM 9.3 8.5   Liver Function Tests:  Recent Labs Lab 04/22/14 2122 04/23/14 0350  AST 24 26  ALT 18 16  ALKPHOS 120* 97  BILITOT 0.7 1.2  PROT 7.0 5.9*  ALBUMIN 3.7 3.2*   No results for input(s): LIPASE, AMYLASE in the last 168 hours. No results for input(s): AMMONIA in the last 168 hours. CBC:  Recent Labs Lab 04/22/14 2122 04/23/14 0350  WBC 12.5* 15.2*  NEUTROABS 11.2*  --   HGB 12.5* 10.6*  HCT 36.3* 31.4*  MCV 94.8 96.3  PLT 167 120*   Cardiac Enzymes: No results for input(s): CKTOTAL, CKMB, CKMBINDEX, TROPONINI in the last 168 hours. BNP (last 3 results) No results for input(s): BNP in the last 8760 hours.  ProBNP (last 3 results) No results for input(s): PROBNP in the last 8760 hours.  CBG: No results for input(s): GLUCAP in the last 168 hours.  Recent Results (from the past 240 hour(s))  Blood culture (routine x 2)     Status: None (Preliminary result)   Collection Time: 04/22/14  9:24 PM  Result Value Ref Range Status   Specimen Description BLOOD RIGHT ARM  Final   Special Requests BOTTLES DRAWN AEROBIC AND ANAEROBIC  10CC  Final   Culture   Final           BLOOD CULTURE RECEIVED NO GROWTH TO DATE CULTURE WILL BE HELD FOR 5 DAYS BEFORE ISSUING A FINAL NEGATIVE REPORT Performed at [facility]    Report Status PENDING  Incomplete  Blood culture (routine x 2)     Status: None (Preliminary result)   Collection Time: 04/22/14 10:49 PM  Result Value Ref Range Status   Specimen Description BLOOD RIGHT ARM  Final   Special Requests BOTTLES DRAWN AEROBIC AND ANAEROBIC 5CC  Final   Culture   Final           BLOOD CULTURE RECEIVED NO GROWTH TO DATE CULTURE WILL BE HELD FOR 5 DAYS BEFORE ISSUING A FINAL NEGATIVE REPORT Performed at [facility]     Report Status PENDING  Incomplete     Studies: Dg Chest 2 View  04/22/2014   CLINICAL DATA:  Shortness of breath, fever, and body aches.  EXAM: CHEST  2 VIEW  COMPARISON:  10/29/2011  FINDINGS: Airspace disease in the right upper lobe, outlining the minor fissure. No effusion or cavitation.  Normal heart size and stable mild aortic tortuosity.  Remote lateral left rib fractures.  IMPRESSION: Right upper lobe pneumonia.   Electronically Signed   By: Monte Fantasia M.D.   On: 04/22/2014 21:42    Scheduled Meds: . aspirin EC  81 mg Oral Daily  . atorvastatin  20 mg Oral Daily  . azithromycin  500 mg Intravenous Q24H  . cefTRIAXone (ROCEPHIN)  IV  1 g Intravenous Q24H  . clonazePAM  0.5 mg Oral QHS  . docusate sodium  200 mg Oral BID  . escitalopram  10 mg Oral Daily  . heparin  5,000 Units Subcutaneous 3 times per day  . levothyroxine  50 mcg Oral QAC breakfast  . sodium chloride  3 mL Intravenous Q12H   Continuous Infusions: . sodium chloride 10 mL/hr at 04/23/14 [place], ABRAHAM  [facility] Pager [phone]. If 7PM-7AM, please contact night-coverage at [email or web address], password [facility] 04/24/2014, 10:11 AM  LOS: 2 days

## 2014-04-25 LAB — CBC
HCT: 30.3 % — ABNORMAL LOW (ref 39.0–52.0)
HEMOGLOBIN: 10.3 g/dL — AB (ref 13.0–17.0)
MCH: 32.1 pg (ref 26.0–34.0)
MCHC: 34 g/dL (ref 30.0–36.0)
MCV: 94.4 fL (ref 78.0–100.0)
Platelets: 167 10*3/uL (ref 150–400)
RBC: 3.21 MIL/uL — ABNORMAL LOW (ref 4.22–5.81)
RDW: 12.8 % (ref 11.5–15.5)
WBC: 5.9 10*3/uL (ref 4.0–10.5)

## 2014-04-25 LAB — URINE CULTURE
CULTURE: NO GROWTH
Colony Count: NO GROWTH

## 2014-04-25 MED ORDER — AZITHROMYCIN 500 MG PO TABS: 500.0000 mg | ORAL_TABLET | Freq: Every day | ORAL | Status: DC

## 2014-04-25 NOTE — Discharge Summary (Signed)
Physician Discharge Summary  Paul Odonnell NOI:[id number] DOB: Feb 16, 1930 DOA: 04/22/2014  PCP: Tommy Medal, MD  Admit date: 04/22/2014 Discharge date: 04/25/2014  Time spent: 30 minutes  Recommendations for Outpatient Follow-up:  1. Follow-up with primary care doctor in 2 weeks For pneumonia   Discharge Diagnoses:  Principal Problem:   CAP (community acquired pneumonia) Active Problems:   Chronic anemia   Essential hypertension   CKD (chronic kidney disease)   Chronic abdominal pain   Discharge Condition: stable  Diet recommendation: regular  Filed Weights   04/23/14 0130 04/24/14 0531 04/25/14 0513  Weight: 71.487 kg (157 lb 9.6 oz) 71.124 kg (156 lb 12.8 oz) 71.215 kg (157 lb)    History of present illness:  81 y.o. male with Past medical history of coronary artery disease, chronic pancreatitis, irritable bowel syndrome, hypertension, diabetes mellitus, prostate cancer, chronic abdominal pain since prostate radiation on narcotic regimen, hypothyroidism, GERD. The patient is presenting with complaints of abdominal pain which is chronic. Patient also has complains of fever. The basic reason why the wife brought the patient here was because of fever. Patient has chronic abdominal pain which is unchanged and he is on narcotic regimen with the pain medication center. Patient was on OxyContin 20 mg in the morning and 40 mg in the evening and was recently switched to oxycodone 10 mg in the morning and noon as well as 20 mg the evening and since then he has poor pain control. Patient had some cough today no vomiting no nausea no diarrhea no constipation. No burning urination or leg swelling or leg tenderness.  Hospital Course:  *CAP (community acquired pneumonia) - Started on empiric antibiotic Rocephin and azithromycin, once he defervesced this change to oral azithromycin. - Remains stable overnight will continue for 5 additional days.  CKD (chronic kidney disease) - Serum  creatinine at baseline, continue close monitoring.  Essential hypertension - Stable no changes were made following the study  Chronic anemia: - Seems to be her baseline. Continue to monitor.  Chronic abdominal pain Unchanged per family members continue current home dose of narcotics.  Procedures:  CXR  Consultations:  none  Discharge Exam: Filed Vitals:   04/25/14 0513  BP: 131/60  Pulse: 70  Temp: 97.8 F (36.6 C)  Resp: 18    General: A&O x3 Cardiovascular: RRR Respiratory: good air movement CTA B/L  Discharge Instructions   Discharge Instructions    Diet - low sodium heart healthy    Complete by:  As directed      Increase activity slowly    Complete by:  As directed           Current Discharge Medication List    START taking these medications   Details  azithromycin (ZITHROMAX) 500 MG tablet Take 1 tablet (500 mg total) by mouth daily. Qty: 5 tablet, Refills: 0      CONTINUE these medications which have NOT CHANGED   Details  aspirin 81 MG tablet Take 81 mg by mouth daily.    atorvastatin (LIPITOR) 40 MG tablet Take 20 mg by mouth daily.    clonazePAM (KLONOPIN) 0.5 MG tablet Take 0.5 mg by mouth at bedtime.    docusate sodium 100 MG CAPS Take 200 mg by mouth 2 (two) times daily.    escitalopram (LEXAPRO) 10 MG tablet Take 10 mg by mouth daily.    levothyroxine (SYNTHROID, LEVOTHROID) 50 MCG tablet Take 50 mcg by mouth daily.    Oxycodone HCl 10 MG TABS Take 10-20 mg  by mouth every 4 (four) hours as needed (pain). 1 tab in the morning 1 at lunch and 2 tabs at bedtime      STOP taking these medications     oxycodone (OXY-IR) 5 MG capsule        Allergies  Allergen Reactions  . Codeine     Pancreatic problems   . Dilaudid [Hydromorphone Hcl] Other (See Comments)    Doesn't act right   . Morphine And Related Other (See Comments)    Doesn't act right       The results of significant diagnostics from this hospitalization  (including imaging, microbiology, ancillary and laboratory) are listed below for reference.    Significant Diagnostic Studies: Dg Chest 2 View  04/22/2014   CLINICAL DATA:  Shortness of breath, fever, and body aches.  EXAM: CHEST  2 VIEW  COMPARISON:  10/29/2011  FINDINGS: Airspace disease in the right upper lobe, outlining the minor fissure. No effusion or cavitation.  Normal heart size and stable mild aortic tortuosity.  Remote lateral left rib fractures.  IMPRESSION: Right upper lobe pneumonia.   Electronically Signed   By: Monte Fantasia M.D.   On: 04/22/2014 21:42   Dg Cervical Spine 2 Or 3 Views  04/15/2014   CLINICAL DATA:  Chronic back pain for least 25 years without definite injury  EXAM: CERVICAL SPINE - 2-3 VIEW  COMPARISON:  Cervical spine CT scan of October 25, 2011  FINDINGS: The cervical vertebral bodies are preserved in height. There is disc space narrowing from C3-4 inferiorly. There are prominent anterior endplate osteophytes at C4-5, 5 6, 6 7, and 7 T1. There is facet joint hypertrophy from C3 inferiorly. The odontoid and spinous processes are intact. The prevertebral soft tissue spaces are normal.  IMPRESSION: There is multilevel degenerative disc and facet joint change from C3 inferiorly. There is no compression fracture peer If there upper extremity radicular symptoms, cervical spine MRI may be useful.   Electronically Signed   By: David  [place]   On: 04/15/2014 11:55   Dg Thoracic Spine W/swimmers  04/15/2014   CLINICAL DATA:  Mini year history of diffuse back pain without definite injury  EXAM: THORACIC SPINE - 2 VIEW + SWIMMERS  COMPARISON:  Coronal and sagittal reconstructed views through the thoracic spine from a chest CT scan dated October 25, 2011  FINDINGS: The thoracic vertebral bodies are preserved in height. The disc space heights are well maintained. There is mild degenerative disc space narrowing at C7-T1 with anterior endplate osteophytes. The pedicles and transverse  processes are intact. There are no abnormal paravertebral soft tissue densities.  IMPRESSION: There is degenerative disc space narrowing at the cervicothoracic junction. Elsewhere the thoracic spine exhibits no significant bony abnormality.   Electronically Signed   By: David  [place]   On: 04/15/2014 11:56   Dg Lumbar Spine 2-3 Views  04/15/2014   CLINICAL DATA:  Mini year history of diffuse back pain without definite injury  EXAM: LUMBAR SPINE - 2-3 VIEW  COMPARISON:  Coronal and sagittal reconstructed images through the lumbar spine from an abdominal and pelvic CT scan of October 25, 2011  FINDINGS: The lumbar vertebral bodies are preserved in height. The disc space heights are well maintained. There is no spondylolisthesis. There is facet joint hypertrophy from L2-3 inferiorly. The pedicles and transverse processes are intact. The observed portions of the sacrum are normal.  IMPRESSION: There is multilevel degenerative facet joint change from L2 inferiorly. There is no high-grade disc  space narrowing nor compression fracture.   Electronically Signed   By: David  [place]   On: 04/15/2014 11:58    Microbiology: Recent Results (from the past 240 hour(s))  Blood culture (routine x 2)     Status: None (Preliminary result)   Collection Time: 04/22/14  9:24 PM  Result Value Ref Range Status   Specimen Description BLOOD RIGHT ARM  Final   Special Requests BOTTLES DRAWN AEROBIC AND ANAEROBIC 10CC  Final   Culture   Final           BLOOD CULTURE RECEIVED NO GROWTH TO DATE CULTURE WILL BE HELD FOR 5 DAYS BEFORE ISSUING A FINAL NEGATIVE REPORT Performed at [facility]    Report Status PENDING  Incomplete  Blood culture (routine x 2)     Status: None (Preliminary result)   Collection Time: 04/22/14 10:49 PM  Result Value Ref Range Status   Specimen Description BLOOD RIGHT ARM  Final   Special Requests BOTTLES DRAWN AEROBIC AND ANAEROBIC 5CC  Final   Culture   Final           BLOOD CULTURE  RECEIVED NO GROWTH TO DATE CULTURE WILL BE HELD FOR 5 DAYS BEFORE ISSUING A FINAL NEGATIVE REPORT Performed at [facility]    Report Status PENDING  Incomplete     Labs: Basic Metabolic Panel:  Recent Labs Lab 04/22/14 2122 04/23/14 0350  NA 137 138  K 3.8 4.0  CL 104 107  CO2 24 22  GLUCOSE 160* 128*  BUN 18 19  CREATININE 1.58* 1.53*  CALCIUM 9.3 8.5   Liver Function Tests:  Recent Labs Lab 04/22/14 2122 04/23/14 0350  AST 24 26  ALT 18 16  ALKPHOS 120* 97  BILITOT 0.7 1.2  PROT 7.0 5.9*  ALBUMIN 3.7 3.2*   No results for input(s): LIPASE, AMYLASE in the last 168 hours. No results for input(s): AMMONIA in the last 168 hours. CBC:  Recent Labs Lab 04/22/14 2122 04/23/14 0350 04/25/14 0540  WBC 12.5* 15.2* 5.9  NEUTROABS 11.2*  --   --   HGB 12.5* 10.6* 10.3*  HCT 36.3* 31.4* 30.3*  MCV 94.8 96.3 94.4  PLT 167 120* 167   Cardiac Enzymes: No results for input(s): CKTOTAL, CKMB, CKMBINDEX, TROPONINI in the last 168 hours. BNP: BNP (last 3 results) No results for input(s): BNP in the last 8760 hours.  ProBNP (last 3 results) No results for input(s): PROBNP in the last 8760 hours.  CBG: No results for input(s): GLUCAP in the last 168 hours.     Signed:  Charlynne Cousins  [facility] 04/25/2014, 9:33 AM

## 2014-04-26 LAB — LEGIONELLA ANTIGEN, URINE

## 2014-04-27 ENCOUNTER — Emergency Department (HOSPITAL_COMMUNITY)
Admission: EM | Admit: 2014-04-27 | Discharge: 2014-04-27 | Disposition: A | Discharge status: Home / Self Care | Payer: Medicare Other | Attending: Emergency Medicine | Admitting: Emergency Medicine

## 2014-04-27 ENCOUNTER — Encounter (HOSPITAL_COMMUNITY): Payer: Self-pay | Admitting: Emergency Medicine

## 2014-04-27 ENCOUNTER — Emergency Department (HOSPITAL_COMMUNITY): Payer: Medicare Other

## 2014-04-27 DIAGNOSIS — J45909 Unspecified asthma, uncomplicated: Secondary | ICD-10-CM | POA: Insufficient documentation

## 2014-04-27 DIAGNOSIS — Z9861 Coronary angioplasty status: Secondary | ICD-10-CM | POA: Diagnosis not present

## 2014-04-27 DIAGNOSIS — Z7982 Long term (current) use of aspirin: Secondary | ICD-10-CM | POA: Insufficient documentation

## 2014-04-27 DIAGNOSIS — E780 Pure hypercholesterolemia: Secondary | ICD-10-CM | POA: Diagnosis not present

## 2014-04-27 DIAGNOSIS — Z862 Personal history of diseases of the blood and blood-forming organs and certain disorders involving the immune mechanism: Secondary | ICD-10-CM | POA: Insufficient documentation

## 2014-04-27 DIAGNOSIS — I252 Old myocardial infarction: Secondary | ICD-10-CM | POA: Insufficient documentation

## 2014-04-27 DIAGNOSIS — I2510 Atherosclerotic heart disease of native coronary artery without angina pectoris: Secondary | ICD-10-CM | POA: Diagnosis not present

## 2014-04-27 DIAGNOSIS — J189 Pneumonia, unspecified organism: Secondary | ICD-10-CM

## 2014-04-27 DIAGNOSIS — Z8659 Personal history of other mental and behavioral disorders: Secondary | ICD-10-CM | POA: Diagnosis not present

## 2014-04-27 DIAGNOSIS — J159 Unspecified bacterial pneumonia: Secondary | ICD-10-CM | POA: Insufficient documentation

## 2014-04-27 DIAGNOSIS — Z8546 Personal history of malignant neoplasm of prostate: Secondary | ICD-10-CM | POA: Insufficient documentation

## 2014-04-27 DIAGNOSIS — Z720 Tobacco use: Secondary | ICD-10-CM | POA: Diagnosis not present

## 2014-04-27 DIAGNOSIS — Z8719 Personal history of other diseases of the digestive system: Secondary | ICD-10-CM | POA: Insufficient documentation

## 2014-04-27 DIAGNOSIS — Z792 Long term (current) use of antibiotics: Secondary | ICD-10-CM | POA: Insufficient documentation

## 2014-04-27 DIAGNOSIS — Z87448 Personal history of other diseases of urinary system: Secondary | ICD-10-CM | POA: Insufficient documentation

## 2014-04-27 DIAGNOSIS — M1990 Unspecified osteoarthritis, unspecified site: Secondary | ICD-10-CM | POA: Diagnosis not present

## 2014-04-27 DIAGNOSIS — Z79899 Other long term (current) drug therapy: Secondary | ICD-10-CM | POA: Insufficient documentation

## 2014-04-27 DIAGNOSIS — Z8619 Personal history of other infectious and parasitic diseases: Secondary | ICD-10-CM | POA: Insufficient documentation

## 2014-04-27 DIAGNOSIS — R079 Chest pain, unspecified: Secondary | ICD-10-CM | POA: Diagnosis present

## 2014-04-27 DIAGNOSIS — E119 Type 2 diabetes mellitus without complications: Secondary | ICD-10-CM | POA: Diagnosis not present

## 2014-04-27 DIAGNOSIS — R0789 Other chest pain: Secondary | ICD-10-CM

## 2014-04-27 HISTORY — DX: Unspecified asthma, uncomplicated: J45.909

## 2014-04-27 LAB — COMPREHENSIVE METABOLIC PANEL
ALT: 28 U/L (ref 0–53)
ANION GAP: 5 (ref 5–15)
AST: 28 U/L (ref 0–37)
Albumin: 3.1 g/dL — ABNORMAL LOW (ref 3.5–5.2)
Alkaline Phosphatase: 92 U/L (ref 39–117)
BILIRUBIN TOTAL: 0.7 mg/dL (ref 0.3–1.2)
BUN: 15 mg/dL (ref 6–23)
CALCIUM: 9 mg/dL (ref 8.4–10.5)
CO2: 31 mmol/L (ref 19–32)
CREATININE: 1.52 mg/dL — AB (ref 0.50–1.35)
Chloride: 106 mmol/L (ref 96–112)
GFR calc Af Amer: 47 mL/min — ABNORMAL LOW (ref >90)
GFR calc non Af Amer: 41 mL/min — ABNORMAL LOW (ref >90)
Glucose, Bld: 174 mg/dL — ABNORMAL HIGH (ref 70–99)
Potassium: 3.5 mmol/L (ref 3.5–5.1)
SODIUM: 142 mmol/L (ref 135–145)
TOTAL PROTEIN: 6.6 g/dL (ref 6.0–8.3)

## 2014-04-27 LAB — I-STAT TROPONIN, ED
TROPONIN I, POC: 0 ng/mL (ref 0.00–0.08)
Troponin i, poc: 0 ng/mL (ref 0.00–0.08)

## 2014-04-27 LAB — LIPASE, BLOOD: LIPASE: 22 U/L (ref 11–59)

## 2014-04-27 LAB — CBC WITH DIFFERENTIAL/PLATELET
Basophils Absolute: 0 10*3/uL (ref 0.0–0.1)
Basophils Relative: 0 % (ref 0–1)
EOS ABS: 0.1 10*3/uL (ref 0.0–0.7)
Eosinophils Relative: 1 % (ref 0–5)
HEMATOCRIT: 32.4 % — AB (ref 39.0–52.0)
Hemoglobin: 10.9 g/dL — ABNORMAL LOW (ref 13.0–17.0)
LYMPHS PCT: 15 % (ref 12–46)
Lymphs Abs: 1 10*3/uL (ref 0.7–4.0)
MCH: 32.2 pg (ref 26.0–34.0)
MCHC: 33.6 g/dL (ref 30.0–36.0)
MCV: 95.6 fL (ref 78.0–100.0)
MONOS PCT: 8 % (ref 3–12)
Monocytes Absolute: 0.5 10*3/uL (ref 0.1–1.0)
Neutro Abs: 5.1 10*3/uL (ref 1.7–7.7)
Neutrophils Relative %: 76 % (ref 43–77)
PLATELETS: 181 10*3/uL (ref 150–400)
RBC: 3.39 MIL/uL — ABNORMAL LOW (ref 4.22–5.81)
RDW: 12.6 % (ref 11.5–15.5)
WBC: 6.7 10*3/uL (ref 4.0–10.5)

## 2014-04-27 LAB — POC OCCULT BLOOD, ED: Fecal Occult Bld: NEGATIVE

## 2014-04-27 MED ORDER — SODIUM CHLORIDE 0.9 % IV BOLUS (SEPSIS)
500.0000 mL | Freq: Once | INTRAVENOUS | Status: AC
Start: 2014-04-27 — End: 2014-04-27
  Administered 2014-04-27: 500 mL via INTRAVENOUS

## 2014-04-27 MED ORDER — IOHEXOL 350 MG/ML SOLN
80.0000 mL | Freq: Once | INTRAVENOUS | Status: AC | PRN
  Administered 2014-04-27: 80 mL via INTRAVENOUS

## 2014-04-27 MED ORDER — ONDANSETRON HCL 4 MG/2ML IJ SOLN
4.0000 mg | Freq: Once | INTRAMUSCULAR | Status: AC
  Administered 2014-04-27: 4 mg via INTRAVENOUS
  Filled 2014-04-27: qty 2

## 2014-04-27 MED ORDER — FENTANYL CITRATE 0.05 MG/ML IJ SOLN
50.0000 ug | Freq: Once | INTRAMUSCULAR | Status: AC
Start: 2014-04-27 — End: 2014-04-27
  Administered 2014-04-27: 50 ug via INTRAVENOUS
  Filled 2014-04-27: qty 2

## 2014-04-27 NOTE — ED Provider Notes (Signed)
TIME SEEN: 8:30 AM  CHIEF COMPLAINT: chest pain   HPI: Pt is a 81 y.o. Male with history of coronary artery disease, hypertension, diabetes, hyperlipidemia, hypothyroidism, AAA, pancreatitis, chronic arthritis pain who presents to the emergency department with complaints of sore diffuse anterior chest pain that radiates into his backthat woke him from sleep at 6 AM. States he is also feeling short of breath. No nausea, vomiting, diaphoresis or dizziness. Reports it hurts to take a deep breath. Was recently admitted to the hospital on 04/22/14 and was diagnosed with pneumonia. States he did have a fever at that time but has not had much cough since diagnosis. He has not had any fever since discharge. He states he is currently on antibiotics. States that this feels different than his prior anginal pain. Denies prior history of PE or DVT. No lower extremity swelling or pain.  Of note patient was chronically on OxyContin and recently had this changed to oxycodone.  ROS: See HPI Constitutional: no fever  Eyes: no drainage  ENT: no runny nose   Cardiovascular:   chest pain  Resp:  SOB  GI: no vomiting GU: no dysuria Integumentary: no rash  Allergy: no hives  Musculoskeletal: no leg swelling  Neurological: no slurred speech ROS otherwise negative  PAST MEDICAL HISTORY/PAST SURGICAL HISTORY:  Past Medical History  Diagnosis Date  . CAD (coronary artery disease)   . MI (myocardial infarction)   . Pancreatitis     with pancreas divisum  . Vitamin B 12 deficiency   . IBS (irritable bowel syndrome)   . HTN (hypertension)   . Depression   . Sepsis due to Pseudomonas     pyelonephritis  . DM (diabetes mellitus)   . Prostate cancer     radiation 2011  . Anemia   . BPH (benign prostatic hyperplasia)   . Renal insufficiency   . Pure hypercholesterolemia   . Hypothyroidism   . Urinary obstruction   . Osteoarthritis   . GERD (gastroesophageal reflux disease)   . Peptic stricture of esophagus    . AAA (abdominal aortic aneurysm)   . Gastritis   . Diverticulosis   . HH (hiatus hernia)     MEDICATIONS:  Prior to Admission medications   Medication Sig Start Date End Date Taking? Authorizing Provider  aspirin 81 MG tablet Take 81 mg by mouth daily.    Historical Provider, MD  atorvastatin (LIPITOR) 40 MG tablet Take 20 mg by mouth daily.    Historical Provider, MD  azithromycin (ZITHROMAX) 500 MG tablet Take 1 tablet (500 mg total) by mouth daily. 04/25/14   Charlynne Cousins, MD  clonazePAM (KLONOPIN) 0.5 MG tablet Take 0.5 mg by mouth at bedtime.    Historical Provider, MD  docusate sodium 100 MG CAPS Take 200 mg by mouth 2 (two) times daily. 10/30/11   Lisette Abu, PA-C  escitalopram (LEXAPRO) 10 MG tablet Take 10 mg by mouth daily.    Historical Provider, MD  levothyroxine (SYNTHROID, LEVOTHROID) 50 MCG tablet Take 50 mcg by mouth daily.    Historical Provider, MD  Oxycodone HCl 10 MG TABS Take 10-20 mg by mouth every 4 (four) hours as needed (pain). 1 tab in the morning 1 at lunch and 2 tabs at bedtime    Historical Provider, MD    ALLERGIES:  Allergies  Allergen Reactions  . Codeine     Pancreatic problems   . Dilaudid [Hydromorphone Hcl] Other (See Comments)    Doesn't act right   .  Morphine And Related Other (See Comments)    Doesn't act right     SOCIAL HISTORY:  History  Substance Use Topics  . Smoking status: Current Some Day Smoker  . Smokeless tobacco: Not on file  . Alcohol Use: Not on file    FAMILY HISTORY: Family History  Problem Relation Age of Onset  . Heart attack Father   . Diabetes Mother   . Heart attack Brother     x 2 brothers    EXAM: BP 144/89 mmHg  Pulse 82  Temp(Src) 98 F (36.7 C) (Oral)  Resp 18  SpO2 94% CONSTITUTIONAL: Alert and oriented and responds appropriately to questions. Well-appearing; well-nourished, patient appears uncomfortable but nontoxic HEAD: Normocephalic EYES: Conjunctivae clear, PERRL ENT: normal  nose; no rhinorrhea; moist mucous membranes; pharynx without lesions noted, no thyromegaly NECK: Supple, no meningismus, no LAD  CARD: RRR; S1 and S2 appreciated; no murmurs, no clicks, no rubs, no gallops, patient is tender to palpation over the anterior chest wall in the thoracic back diffusely without crepitus or ecchymosis or deformity RESP: no tachypnea; breath sounds clear and equal bilaterally; no wheezes, no rhonchi, no rales, no hypoxia or respiratory distress the patient is splinting secondary to pain ABD/GI: Normal bowel sounds; non-distended; soft, non-tender, no rebound, no guarding RECTAL:  Normal rectal tone, no gross blood or melena, no rectal pain on exam BACK:  The back appears normal and is tender to palpation diffusely throughout the thoracic paraspinal musculature but no midline spinal tenderness or step-off or deformity, there is no CVA tenderness EXT: Normal ROM in all joints; non-tender to palpation; no edema; normal capillary refill; no cyanosis, no calf tenderness or swelling, 2+ radial and DP pulses bilaterally    SKIN: Normal color for age and race; warm NEURO: Moves all extremities equally PSYCH: The patient's mood and manner are appropriate. Grooming and personal hygiene are appropriate.  MEDICAL DECISION MAKING: Patient here with chest pain that woke him from sleep. He is hemodynamically stable. EKG shows slightly worsened Q waves in lateral leads than a prior EKG in 2013 but otherwise no new ischemic changes. He has a history of CAD but reports this feels much different. Was recently admitted to the hospital for community-acquired pneumonia. Has a history of AAA reports pain that radiates into his back. Given recent hospitalization he is also risk for pulmonary embolus. We'll obtain cardiac labs, CT of his chest, abdomen and pelvis to evaluate for PE and AAA, dissection. We'll give fentanyl for pain.  ED PROGRESS: Patient's labs are unremarkable other than mild chronic  kidney disease which is stable. Troponin negative.  CT scan redemonstrates patient's right upper lobe pneumonia with a possible lingular pneumonia that was not previously seen on chest x-ray. No pulmonary embolus. He has an infrarenal abdominal aortic aneurysm that is 3 cm in diameter. Have recommended outpatient follow-up for ultrasound in 3 years. No dissection. There is also rectal wall thickening on CT scan. He reports he has had 2 weeks of rectal pain but is having normal bowel movements without bloody stool or melena. Rectal exam is unremarkable with no significant tenderness, no blood, no melena.  Patient reports his pain has markedly improved after fentanyl. Appears he is currently on azithromycin at home. Suspect that his pneumonia is improving given he no longer has a leukocytosis and no longer is having fever. I do not feel he needs admission at this time but have recommend close outpatient follow-up as he will need repeat chest imaging  to make sure this pneumonia resolves. Suspect his pain was chest wall pain from his pneumonia. Plan is to repeat second troponin in 3 hours which would be over 6 hours from onset of symptoms. If negative, will discharge home and patient is comfortable with this plan.  Patient's second troponin is negative. He reports he is still chest pain-free after only one dose of IV fentanyl. Have advised him to follow-up with his primary care physician. Discussed with patient that his chest x-ray still shows pneumonia and I recommend he continues his azithromycin and has close follow-up with his PCP for a chest x-ray to ensure resolution. Have also discussed with him I recommend gastroenterology follow-up for his rectal wall thickening. Fecal occult is negative. He is not tender on exam I do not feel he needs further antibiotics for this. He also has a thyroid nodule seen on CT scan. Have discussed this with patient and wife and recommended outpatient follow-up for ultrasound.  Discussed return precautions. He verbalizes understanding and is comfortable with plan.    EKG Interpretation  Date/Time:  Tuesday April 27 2014 08:13:26 EDT Ventricular Rate:  92 PR Interval:  178 QRS Duration: 96 QT Interval:  368 QTC Calculation: 455 R Axis:   -29 Text Interpretation:  Sinus rhythm with frequent Premature ventricular complexes Left ventricular hypertrophy Possible Lateral infarct , age undetermined Cannot rule out Inferior infarct , age undetermined Abnormal ECG Q waves in lateral leads larger than in 2013 Confirmed by WARD,  DO, KRISTEN [phone]) on 04/27/2014 8:16:55 AM        [place], DO 04/27/14 1238

## 2014-04-27 NOTE — Discharge Instructions (Signed)
Chest Wall Pain Chest wall pain is pain in or around the bones and muscles of your chest. It may take up to 6 weeks to get better. It may take longer if you must stay physically active in your work and activities.  CAUSES  Chest wall pain may happen on its own. However, it may be caused by:  A viral illness like the flu.  Injury.  Coughing.  Exercise.  Arthritis.  Fibromyalgia.  Shingles. HOME CARE INSTRUCTIONS   Avoid overtiring physical activity. Try not to strain or perform activities that cause pain. This includes any activities using your chest or your abdominal and side muscles, especially if heavy weights are used.  Put ice on the sore area.  Put ice in a plastic bag.  Place a towel between your skin and the bag.  Leave the ice on for 15-20 minutes per hour while awake for the first 2 days.  Only take over-the-counter or prescription medicines for pain, discomfort, or fever as directed by your caregiver. SEEK IMMEDIATE MEDICAL CARE IF:   Your pain increases, or you are very uncomfortable.  You have a fever.  Your chest pain becomes worse.  You have new, unexplained symptoms.  You have nausea or vomiting.  You feel sweaty or lightheaded.  You have a cough with phlegm (sputum), or you cough up blood. MAKE SURE YOU:   Understand these instructions.  Will watch your condition.  Will get help right away if you are not doing well or get worse. Document Released: 01/08/2005 Document Revised: 04/02/2011 Document Reviewed: 09/04/2010 [facility] Patient Information 2015 [place], [place]. This information is not intended to replace advice given to you by your health care provider. Make sure you discuss any questions you have with your health care provider.  Pneumonia Pneumonia is an infection of the lungs.  CAUSES Pneumonia may be caused by bacteria or a virus. Usually, these infections are caused by breathing infectious particles into the lungs (respiratory  tract). SIGNS AND SYMPTOMS   Cough.  Fever.  Chest pain.  Increased rate of breathing.  Wheezing.  Mucus production. DIAGNOSIS  If you have the common symptoms of pneumonia, your health care provider will typically confirm the diagnosis with a chest X-ray. The X-ray will show an abnormality in the lung (pulmonary infiltrate) if you have pneumonia. Other tests of your blood, urine, or sputum may be done to find the specific cause of your pneumonia. Your health care provider may also do tests (blood gases or pulse oximetry) to see how well your lungs are working. TREATMENT  Some forms of pneumonia may be spread to other people when you cough or sneeze. You may be asked to wear a mask before and during your exam. Pneumonia that is caused by bacteria is treated with antibiotic medicine. Pneumonia that is caused by the influenza virus may be treated with an antiviral medicine. Most other viral infections must run their course. These infections will not respond to antibiotics.  HOME CARE INSTRUCTIONS   Cough suppressants may be used if you are losing too much rest. However, coughing protects you by clearing your lungs. You should avoid using cough suppressants if you can.  Your health care provider may have prescribed medicine if he or she thinks your pneumonia is caused by bacteria or influenza. Finish your medicine even if you start to feel better.  Your health care provider may also prescribe an expectorant. This loosens the mucus to be coughed up.  Take medicines only as directed  by your health care provider.  Do not smoke. Smoking is a common cause of bronchitis and can contribute to pneumonia. If you are a smoker and continue to smoke, your cough may last several weeks after your pneumonia has cleared.  A cold steam vaporizer or humidifier in your room or home may help loosen mucus.  Coughing is often worse at night. Sleeping in a semi-upright position in a recliner or using a couple  pillows under your head will help with this.  Get rest as you feel it is needed. Your body will usually let you know when you need to rest. PREVENTION A pneumococcal shot (vaccine) is available to prevent a common bacterial cause of pneumonia. This is usually suggested for:  People over 56 years old.  Patients on chemotherapy.  People with chronic lung problems, such as bronchitis or emphysema.  People with immune system problems. If you are over 65 or have a high risk condition, you may receive the pneumococcal vaccine if you have not received it before. In some countries, a routine influenza vaccine is also recommended. This vaccine can help prevent some cases of pneumonia.You may be offered the influenza vaccine as part of your care. If you smoke, it is time to quit. You may receive instructions on how to stop smoking. Your health care provider can provide medicines and counseling to help you quit. SEEK MEDICAL CARE IF: You have a fever. SEEK IMMEDIATE MEDICAL CARE IF:   Your illness becomes worse. This is especially true if you are elderly or weakened from any other disease.  You cannot control your cough with suppressants and are losing sleep.  You begin coughing up blood.  You develop pain which is getting worse or is uncontrolled with medicines.  Any of the symptoms which initially brought you in for treatment are getting worse rather than better.  You develop shortness of breath or chest pain. MAKE SURE YOU:   Understand these instructions.  Will watch your condition.  Will get help right away if you are not doing well or get worse. Document Released: 01/08/2005 Document Revised: 05/25/2013 Document Reviewed: 03/30/2010 [facility] Patient Information 2015 [place], [place]. This information is not intended to replace advice given to you by your health care provider. Make sure you discuss any questions you have with your health care provider.

## 2014-04-27 NOTE — ED Notes (Signed)
Pt dx with pneumonia Thursday night and sent home; chest pain now "killing him". Cough. Chest is hurting just sitting in bed; also has SOB. Still taking antibiotics.

## 2014-04-29 LAB — CULTURE, BLOOD (ROUTINE X 2)
CULTURE: NO GROWTH
Culture: NO GROWTH

## 2014-08-02 ENCOUNTER — Encounter: Payer: Self-pay | Admitting: *Deleted

## 2014-08-05 ENCOUNTER — Encounter: Payer: Self-pay | Admitting: Cardiology

## 2014-08-05 ENCOUNTER — Ambulatory Visit (INDEPENDENT_AMBULATORY_CARE_PROVIDER_SITE_OTHER): Payer: Medicare Other | Admitting: Cardiology

## 2014-08-05 VITALS — BP 130/54 | HR 63 | Ht 71.0 in | Wt 160.0 lb

## 2014-08-05 DIAGNOSIS — I2510 Atherosclerotic heart disease of native coronary artery without angina pectoris: Secondary | ICD-10-CM | POA: Diagnosis not present

## 2014-08-05 DIAGNOSIS — I10 Essential (primary) hypertension: Secondary | ICD-10-CM | POA: Diagnosis not present

## 2014-08-05 DIAGNOSIS — I252 Old myocardial infarction: Secondary | ICD-10-CM | POA: Diagnosis not present
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
## 2014-08-05 NOTE — Patient Instructions (Signed)
Medication Instructions:  Your physician recommends that you continue on your current medications as directed. Please refer to the Current Medication list given to you today.  Follow-Up: Follow up in 1 year with Dr. Skains.  You will receive a letter in the mail 2 months before you are due.  Please call us when you receive this letter to schedule your follow up appointment.  Thank you for choosing [facility]!!       

## 2014-10-21 ENCOUNTER — Other Ambulatory Visit: Payer: Self-pay | Admitting: Cardiology

## 2014-10-21 NOTE — Telephone Encounter (Signed)
Per Dr Marlou Porch - OK to refill

## 2014-11-04 ENCOUNTER — Other Ambulatory Visit: Payer: Self-pay | Admitting: Cardiology

## 2014-11-04 MED ORDER — ATORVASTATIN CALCIUM 40 MG PO TABS: 20.0000 mg | ORAL_TABLET | Freq: Every day | ORAL | Status: DC

## 2015-02-24 ENCOUNTER — Other Ambulatory Visit: Payer: Self-pay | Admitting: Pain Medicine

## 2015-02-24 DIAGNOSIS — M545 Low back pain: Secondary | ICD-10-CM

## 2015-03-03 ENCOUNTER — Ambulatory Visit
Admission: RE | Admit: 2015-03-03 | Discharge: 2015-03-03 | Disposition: A | Discharge status: Home / Self Care | Payer: Medicare Other | Source: Ambulatory Visit | Attending: Pain Medicine | Admitting: Pain Medicine

## 2015-03-03 DIAGNOSIS — M545 Low back pain: Secondary | ICD-10-CM

## 2015-07-05 IMAGING — DX DG CHEST 2V
2 series · 2 of 2 positions shown · non-contrast
Comparison: 10/29/2011

CLINICAL DATA: Shortness of breath, fever, and body aches.

EXAM:
CHEST  2 VIEW

[chest lat]
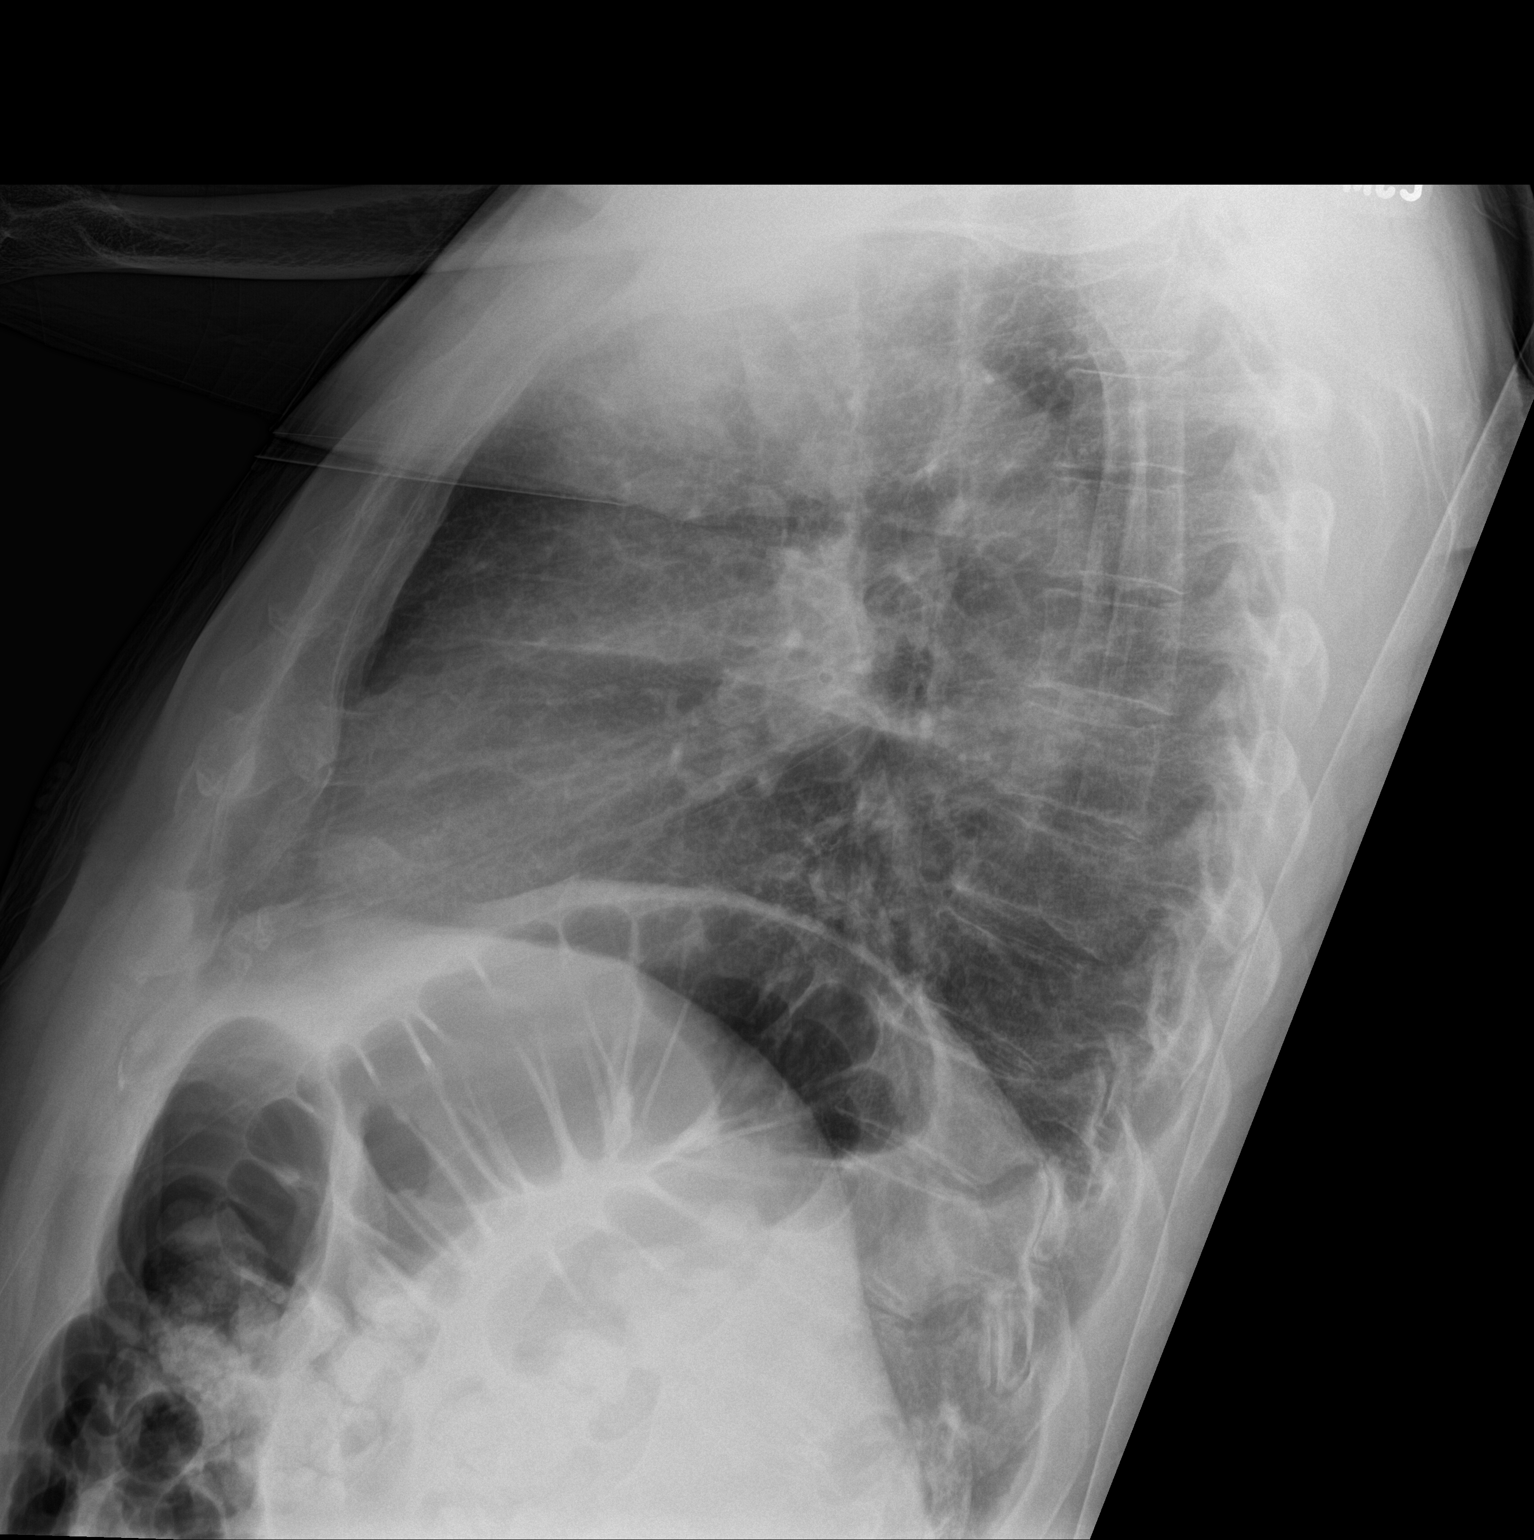

[chest ap]
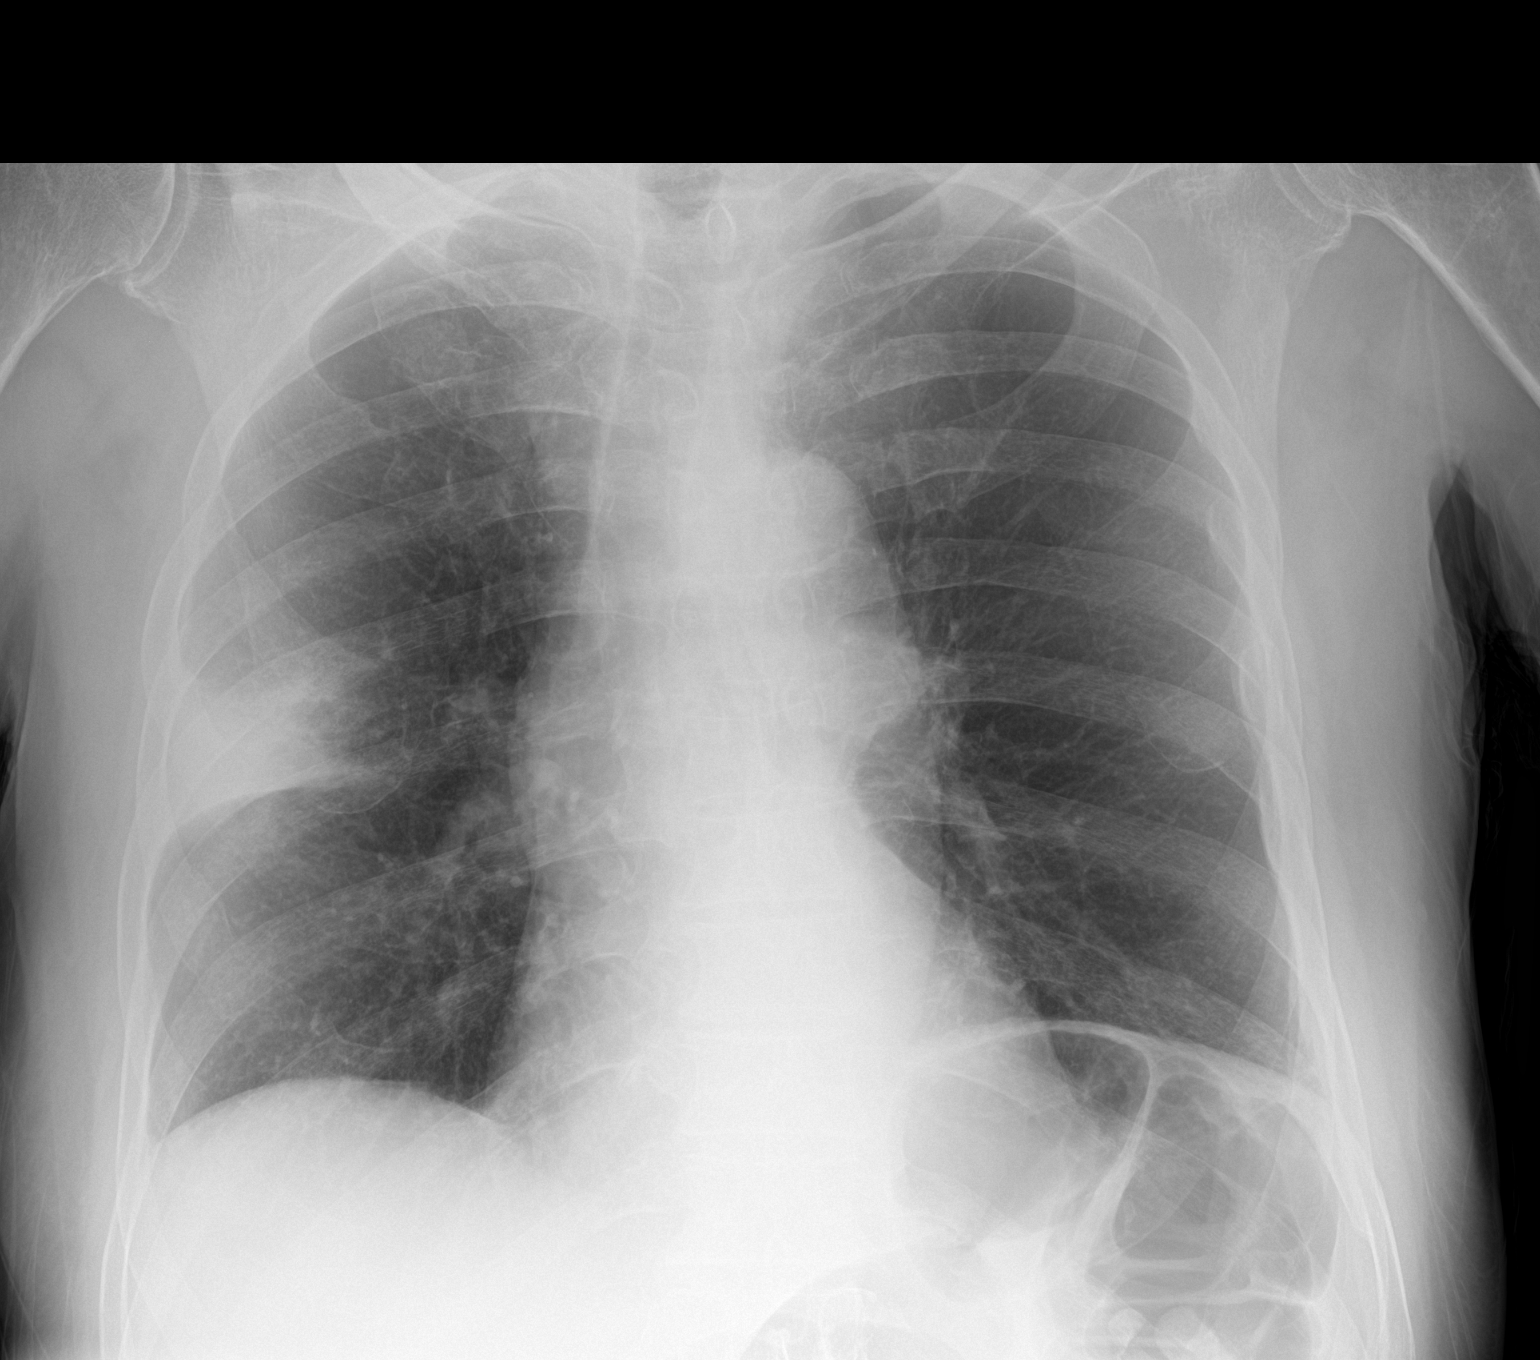

[2 of 2 positions shown; findings below may reference images not displayed]

FINDINGS: Airspace disease in the right upper lobe, outlining the minor
fissure. No effusion or cavitation.

Normal heart size and stable mild aortic tortuosity.

Remote lateral left rib fractures.
IMPRESSION: Right upper lobe pneumonia.

## 2015-08-05 ENCOUNTER — Ambulatory Visit: Payer: Medicare Other | Admitting: Cardiology

## 2015-08-22 ENCOUNTER — Ambulatory Visit (INDEPENDENT_AMBULATORY_CARE_PROVIDER_SITE_OTHER): Payer: Medicare Other | Admitting: Cardiology

## 2015-08-22 ENCOUNTER — Encounter: Payer: Self-pay | Admitting: Cardiology

## 2015-08-22 VITALS — BP 142/78 | HR 58 | Ht 71.5 in | Wt 154.4 lb

## 2015-08-22 DIAGNOSIS — I10 Essential (primary) hypertension: Secondary | ICD-10-CM | POA: Diagnosis not present

## 2015-08-22 DIAGNOSIS — I252 Old myocardial infarction: Secondary | ICD-10-CM | POA: Diagnosis not present

## 2015-08-22 DIAGNOSIS — I2510 Atherosclerotic heart disease of native coronary artery without angina pectoris: Secondary | ICD-10-CM
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
## 2015-08-22 NOTE — Patient Instructions (Signed)

## 2016-02-10 ENCOUNTER — Other Ambulatory Visit: Payer: Self-pay | Admitting: Cardiology

## 2016-02-13 ENCOUNTER — Other Ambulatory Visit: Payer: Self-pay | Admitting: Cardiology

## 2016-02-15 ENCOUNTER — Other Ambulatory Visit: Payer: Self-pay | Admitting: Cardiology

## 2016-02-15 NOTE — Telephone Encounter (Signed)
Rx refill sent to pharmacy. 

## 2016-02-19 ENCOUNTER — Inpatient Hospital Stay (HOSPITAL_COMMUNITY)
Admission: EM | Admit: 2016-02-19 | Discharge: 2016-02-23 | DRG: 300 | Disposition: A | Discharge status: Home Health | Payer: Medicare Other | Attending: Internal Medicine | Admitting: Internal Medicine

## 2016-02-19 ENCOUNTER — Encounter (HOSPITAL_COMMUNITY): Payer: Self-pay | Admitting: Emergency Medicine

## 2016-02-19 ENCOUNTER — Emergency Department (HOSPITAL_COMMUNITY): Payer: Medicare Other

## 2016-02-19 DIAGNOSIS — E785 Hyperlipidemia, unspecified: Secondary | ICD-10-CM | POA: Diagnosis present

## 2016-02-19 DIAGNOSIS — M549 Dorsalgia, unspecified: Secondary | ICD-10-CM | POA: Diagnosis present

## 2016-02-19 DIAGNOSIS — K589 Irritable bowel syndrome without diarrhea: Secondary | ICD-10-CM | POA: Diagnosis present

## 2016-02-19 DIAGNOSIS — Z955 Presence of coronary angioplasty implant and graft: Secondary | ICD-10-CM | POA: Diagnosis not present

## 2016-02-19 DIAGNOSIS — I252 Old myocardial infarction: Secondary | ICD-10-CM

## 2016-02-19 DIAGNOSIS — K36 Other appendicitis: Secondary | ICD-10-CM | POA: Diagnosis present

## 2016-02-19 DIAGNOSIS — G8929 Other chronic pain: Secondary | ICD-10-CM | POA: Diagnosis present

## 2016-02-19 DIAGNOSIS — F17200 Nicotine dependence, unspecified, uncomplicated: Secondary | ICD-10-CM | POA: Diagnosis present

## 2016-02-19 DIAGNOSIS — I160 Hypertensive urgency: Secondary | ICD-10-CM | POA: Diagnosis present

## 2016-02-19 DIAGNOSIS — I7102 Dissection of abdominal aorta: Secondary | ICD-10-CM | POA: Diagnosis present

## 2016-02-19 DIAGNOSIS — M5440 Lumbago with sciatica, unspecified side: Secondary | ICD-10-CM | POA: Diagnosis not present

## 2016-02-19 DIAGNOSIS — F329 Major depressive disorder, single episode, unspecified: Secondary | ICD-10-CM | POA: Diagnosis present

## 2016-02-19 DIAGNOSIS — I7100 Dissection of unspecified site of aorta: Secondary | ICD-10-CM | POA: Diagnosis present

## 2016-02-19 DIAGNOSIS — Z79899 Other long term (current) drug therapy: Secondary | ICD-10-CM

## 2016-02-19 DIAGNOSIS — E1122 Type 2 diabetes mellitus with diabetic chronic kidney disease: Secondary | ICD-10-CM | POA: Diagnosis present

## 2016-02-19 DIAGNOSIS — M5441 Lumbago with sciatica, right side: Secondary | ICD-10-CM | POA: Diagnosis present

## 2016-02-19 DIAGNOSIS — Z7982 Long term (current) use of aspirin: Secondary | ICD-10-CM

## 2016-02-19 DIAGNOSIS — Z888 Allergy status to other drugs, medicaments and biological substances status: Secondary | ICD-10-CM

## 2016-02-19 DIAGNOSIS — E1121 Type 2 diabetes mellitus with diabetic nephropathy: Secondary | ICD-10-CM | POA: Diagnosis not present

## 2016-02-19 DIAGNOSIS — Z8546 Personal history of malignant neoplasm of prostate: Secondary | ICD-10-CM

## 2016-02-19 DIAGNOSIS — Z8249 Family history of ischemic heart disease and other diseases of the circulatory system: Secondary | ICD-10-CM

## 2016-02-19 DIAGNOSIS — Z833 Family history of diabetes mellitus: Secondary | ICD-10-CM

## 2016-02-19 DIAGNOSIS — F419 Anxiety disorder, unspecified: Secondary | ICD-10-CM | POA: Diagnosis present

## 2016-02-19 DIAGNOSIS — K3589 Other acute appendicitis: Secondary | ICD-10-CM | POA: Diagnosis present

## 2016-02-19 DIAGNOSIS — K449 Diaphragmatic hernia without obstruction or gangrene: Secondary | ICD-10-CM | POA: Diagnosis present

## 2016-02-19 DIAGNOSIS — D649 Anemia, unspecified: Secondary | ICD-10-CM | POA: Diagnosis present

## 2016-02-19 DIAGNOSIS — I2510 Atherosclerotic heart disease of native coronary artery without angina pectoris: Secondary | ICD-10-CM | POA: Diagnosis present

## 2016-02-19 DIAGNOSIS — E7800 Pure hypercholesterolemia, unspecified: Secondary | ICD-10-CM | POA: Diagnosis present

## 2016-02-19 DIAGNOSIS — I714 Abdominal aortic aneurysm, without rupture: Secondary | ICD-10-CM

## 2016-02-19 DIAGNOSIS — I7101 Dissection of thoracic aorta: Secondary | ICD-10-CM | POA: Diagnosis not present

## 2016-02-19 DIAGNOSIS — I10 Essential (primary) hypertension: Secondary | ICD-10-CM | POA: Diagnosis present

## 2016-02-19 DIAGNOSIS — N400 Enlarged prostate without lower urinary tract symptoms: Secondary | ICD-10-CM | POA: Diagnosis present

## 2016-02-19 DIAGNOSIS — R109 Unspecified abdominal pain: Secondary | ICD-10-CM

## 2016-02-19 DIAGNOSIS — K219 Gastro-esophageal reflux disease without esophagitis: Secondary | ICD-10-CM | POA: Diagnosis present

## 2016-02-19 DIAGNOSIS — E039 Hypothyroidism, unspecified: Secondary | ICD-10-CM | POA: Diagnosis present

## 2016-02-19 DIAGNOSIS — Z66 Do not resuscitate: Secondary | ICD-10-CM | POA: Diagnosis present

## 2016-02-19 DIAGNOSIS — N183 Chronic kidney disease, stage 3 (moderate): Secondary | ICD-10-CM | POA: Diagnosis present

## 2016-02-19 DIAGNOSIS — Z923 Personal history of irradiation: Secondary | ICD-10-CM

## 2016-02-19 DIAGNOSIS — R079 Chest pain, unspecified: Secondary | ICD-10-CM

## 2016-02-19 DIAGNOSIS — Z885 Allergy status to narcotic agent status: Secondary | ICD-10-CM

## 2016-02-19 LAB — CBC
HCT: 35.5 % — ABNORMAL LOW (ref 39.0–52.0)
HCT: 36.5 % — ABNORMAL LOW (ref 39.0–52.0)
HEMOGLOBIN: 11.9 g/dL — AB (ref 13.0–17.0)
Hemoglobin: 12.3 g/dL — ABNORMAL LOW (ref 13.0–17.0)
MCH: 32.2 pg (ref 26.0–34.0)
MCH: 32 pg (ref 26.0–34.0)
MCHC: 33.5 g/dL (ref 30.0–36.0)
MCHC: 33.7 g/dL (ref 30.0–36.0)
MCV: 95.9 fL (ref 78.0–100.0)
MCV: 95.1 fL (ref 78.0–100.0)
PLATELETS: 197 10*3/uL (ref 150–400)
PLATELETS: 180 10*3/uL (ref 150–400)
RBC: 3.7 MIL/uL — ABNORMAL LOW (ref 4.22–5.81)
RBC: 3.84 MIL/uL — AB (ref 4.22–5.81)
RDW: 13 % (ref 11.5–15.5)
RDW: 13.1 % (ref 11.5–15.5)
WBC: 5.4 10*3/uL (ref 4.0–10.5)
WBC: 6 10*3/uL (ref 4.0–10.5)

## 2016-02-19 LAB — HEPATIC FUNCTION PANEL
ALT: 14 U/L — ABNORMAL LOW (ref 17–63)
AST: 21 U/L (ref 15–41)
Albumin: 3.9 g/dL (ref 3.5–5.0)
Alkaline Phosphatase: 76 U/L (ref 38–126)
BILIRUBIN TOTAL: 0.5 mg/dL (ref 0.3–1.2)
Total Protein: 6.8 g/dL (ref 6.5–8.1)

## 2016-02-19 LAB — BASIC METABOLIC PANEL
ANION GAP: 9 (ref 5–15)
BUN: 17 mg/dL (ref 6–20)
CALCIUM: 9.3 mg/dL (ref 8.9–10.3)
CO2: 25 mmol/L (ref 22–32)
CREATININE: 1.44 mg/dL — AB (ref 0.61–1.24)
Chloride: 107 mmol/L (ref 101–111)
GFR calc Af Amer: 50 mL/min — ABNORMAL LOW (ref >60)
GFR calc non Af Amer: 43 mL/min — ABNORMAL LOW (ref >60)
Glucose, Bld: 165 mg/dL — ABNORMAL HIGH (ref 65–99)
Potassium: 3.7 mmol/L (ref 3.5–5.1)
Sodium: 141 mmol/L (ref 135–145)

## 2016-02-19 LAB — PHOSPHORUS: Phosphorus: 2.5 mg/dL (ref 2.5–4.6)

## 2016-02-19 LAB — MRSA PCR SCREENING: MRSA BY PCR: NEGATIVE

## 2016-02-19 LAB — I-STAT TROPONIN, ED: TROPONIN I, POC: 0.01 ng/mL (ref 0.00–0.08)

## 2016-02-19 LAB — LIPASE, BLOOD: Lipase: 24 U/L (ref 11–51)

## 2016-02-19 LAB — CREATININE, SERUM
CREATININE: 1.31 mg/dL — AB (ref 0.61–1.24)
GFR calc non Af Amer: 49 mL/min — ABNORMAL LOW (ref >60)
GFR, EST AFRICAN AMERICAN: 56 mL/min — AB (ref >60)

## 2016-02-19 LAB — MAGNESIUM: MAGNESIUM: 2 mg/dL (ref 1.7–2.4)

## 2016-02-19 MED ORDER — IOPAMIDOL (ISOVUE-370) INJECTION 76%
INTRAVENOUS | Status: AC
  Administered 2016-02-19: 80 mL
  Filled 2016-02-19: qty 100

## 2016-02-19 MED ORDER — LEVOTHYROXINE SODIUM 50 MCG PO TABS
50.0000 ug | ORAL_TABLET | Freq: Every day | ORAL | Status: DC
  Administered 2016-02-20 – 2016-02-23 (×4): 50 ug via ORAL
  Filled 2016-02-19 (×5): qty 1

## 2016-02-19 MED ORDER — CLONAZEPAM 0.5 MG PO TABS
0.5000 mg | ORAL_TABLET | Freq: Every day | ORAL | Status: DC
Start: 2016-02-19 — End: 2016-02-23
  Administered 2016-02-19 – 2016-02-22 (×4): 0.5 mg via ORAL
  Filled 2016-02-19 (×4): qty 1

## 2016-02-19 MED ORDER — NICARDIPINE HCL IN NACL 20-0.86 MG/200ML-% IV SOLN
3.0000 mg/h | Freq: Once | INTRAVENOUS | Status: AC
Start: 2016-02-19 — End: 2016-02-19
  Administered 2016-02-19: 5 mg/h via INTRAVENOUS
  Filled 2016-02-19: qty 200

## 2016-02-19 MED ORDER — ESCITALOPRAM OXALATE 10 MG PO TABS
10.0000 mg | ORAL_TABLET | Freq: Every day | ORAL | Status: DC
  Administered 2016-02-20 – 2016-02-23 (×4): 10 mg via ORAL
  Filled 2016-02-19 (×4): qty 1

## 2016-02-19 MED ORDER — ASPIRIN EC 81 MG PO TBEC
81.0000 mg | DELAYED_RELEASE_TABLET | Freq: Every day | ORAL | Status: DC
  Administered 2016-02-20 – 2016-02-23 (×4): 81 mg via ORAL
  Filled 2016-02-19 (×4): qty 1

## 2016-02-19 MED ORDER — ACETAMINOPHEN 325 MG PO TABS
650.0000 mg | ORAL_TABLET | ORAL | Status: DC | PRN
  Administered 2016-02-19: 650 mg via ORAL
  Filled 2016-02-19: qty 2

## 2016-02-19 MED ORDER — DOCUSATE SODIUM 100 MG PO CAPS: 200.0000 mg | ORAL_CAPSULE | Freq: Two times a day (BID) | ORAL | Status: DC | PRN

## 2016-02-19 MED ORDER — MORPHINE SULFATE (PF) 4 MG/ML IV SOLN
2.0000 mg | INTRAVENOUS | Status: DC | PRN
  Administered 2016-02-19 – 2016-02-22 (×7): 2 mg via INTRAVENOUS
  Filled 2016-02-19 (×6): qty 1

## 2016-02-19 MED ORDER — SODIUM CHLORIDE 0.9 % IV SOLN
250.0000 mL | INTRAVENOUS | Status: DC | PRN
  Administered 2016-02-19: 250 mL via INTRAVENOUS

## 2016-02-19 MED ORDER — ESMOLOL HCL-SODIUM CHLORIDE 2000 MG/100ML IV SOLN
25.0000 ug/kg/min | INTRAVENOUS | Status: DC
  Administered 2016-02-19: 25 ug/kg/min via INTRAVENOUS
  Administered 2016-02-20: 150 ug/kg/min via INTRAVENOUS
  Administered 2016-02-20: 225 ug/kg/min via INTRAVENOUS
  Administered 2016-02-20: 275 ug/kg/min via INTRAVENOUS
  Administered 2016-02-20: 225 ug/kg/min via INTRAVENOUS
  Administered 2016-02-20: 125 ug/kg/min via INTRAVENOUS
  Administered 2016-02-20: 175 ug/kg/min via INTRAVENOUS
  Administered 2016-02-20: 250 ug/kg/min via INTRAVENOUS
  Administered 2016-02-20 (×2): 300 ug/kg/min via INTRAVENOUS
  Administered 2016-02-21: 250 ug/kg/min via INTRAVENOUS
  Administered 2016-02-21: 100 ug/kg/min via INTRAVENOUS
  Administered 2016-02-21: 250 ug/kg/min via INTRAVENOUS
  Administered 2016-02-21: 150 ug/kg/min via INTRAVENOUS
  Administered 2016-02-21: 250 ug/kg/min via INTRAVENOUS
  Filled 2016-02-19 (×15): qty 100

## 2016-02-19 MED ORDER — DOCUSATE SODIUM 100 MG PO CAPS: 200.0000 mg | ORAL_CAPSULE | Freq: Two times a day (BID) | ORAL | Status: DC

## 2016-02-19 MED ORDER — PIPERACILLIN-TAZOBACTAM 3.375 G IVPB
3.3750 g | Freq: Three times a day (TID) | INTRAVENOUS | Status: DC
  Administered 2016-02-19 – 2016-02-21 (×5): 3.375 g via INTRAVENOUS
  Filled 2016-02-19 (×6): qty 50

## 2016-02-19 MED ORDER — ATORVASTATIN CALCIUM 20 MG PO TABS
20.0000 mg | ORAL_TABLET | Freq: Every day | ORAL | Status: DC
  Administered 2016-02-19 – 2016-02-22 (×4): 20 mg via ORAL
  Filled 2016-02-19 (×5): qty 1

## 2016-02-19 MED ORDER — GABAPENTIN 100 MG PO CAPS
100.0000 mg | ORAL_CAPSULE | Freq: Two times a day (BID) | ORAL | Status: DC
  Administered 2016-02-19 – 2016-02-23 (×8): 100 mg via ORAL
  Filled 2016-02-19 (×8): qty 1

## 2016-02-19 MED ORDER — FENTANYL CITRATE (PF) 100 MCG/2ML IJ SOLN
75.0000 ug | INTRAMUSCULAR | Status: DC | PRN
  Administered 2016-02-19: 75 ug via INTRAVENOUS
  Filled 2016-02-19: qty 2

## 2016-02-19 MED ORDER — HEPARIN SODIUM (PORCINE) 5000 UNIT/ML IJ SOLN
5000.0000 [IU] | Freq: Three times a day (TID) | INTRAMUSCULAR | Status: DC
  Administered 2016-02-19 – 2016-02-23 (×11): 5000 [IU] via SUBCUTANEOUS
  Filled 2016-02-19 (×11): qty 1

## 2016-02-19 MED ORDER — SODIUM CHLORIDE 0.9 % IV SOLN: INTRAVENOUS | Status: DC | PRN

## 2016-02-19 NOTE — ED Provider Notes (Signed)
[facility] DEPT Provider Note   CSN: [id number] Arrival date & time: 02/19/16  1239     History   Chief Complaint Chief Complaint  Patient presents with  . Chest Pain  . Abdominal Pain    HPI Paul Odonnell is a 83 y.o. male. CC: AP and AP.  HPI:  Patient presents for evaluation of abdominal pain primarily. Has history of chronic abdominal pain. After thorough questioning it sounds like this is been present for 10 years since he received radiation therapy to his abdomen. He states that he is fine as long as he doesn't eat meat. He states he had chicken Alfredo last night started getting some abdominal pain after. Points to his periumbilical  abdomen. States it radiates into his chest. He states he has chronic back pain from arthritis. This goes from his shoulders to his knees and he is hurting there as well. He cannot separate whether this is his typical arthritic pain or if it is related to his abdomen.  He denies nausea or vomiting. He denies shortness of breath. No pressure in his chest. No shoulder or arm pain.  History of a single vessel PTCA for ACS in 2009. Follows with Dr. Gypsy Balsam. Has been angina free since that time. Compliant with medications per his report.  Past Medical History:  Diagnosis Date  . AAA (abdominal aortic aneurysm) ([place])   . Anemia   . Asthma   . BPH (benign prostatic hyperplasia)   . CAD (coronary artery disease)   . Depression   . Diverticulosis   . DM (diabetes mellitus) ([place])   . Gastritis   . GERD (gastroesophageal reflux disease)   . HH (hiatus hernia)   . HTN (hypertension)   . Hypothyroidism   . IBS (irritable bowel syndrome)   . MI (myocardial infarction)   . Osteoarthritis   . Pancreatitis    with pancreas divisum  . Peptic stricture of esophagus   . Prostate cancer ([place])    radiation 2011  . Pure hypercholesterolemia   . Renal insufficiency   . Sepsis due to Pseudomonas ([place])    pyelonephritis  . Urinary obstruction   .  Vitamin B 12 deficiency     Patient Active Problem List   Diagnosis Date Noted  . CKD (chronic kidney disease) 04/23/2014  . Chronic abdominal pain 04/23/2014  . CAP (community acquired pneumonia) 04/22/2014  . Coronary artery disease involving native coronary artery of native heart without angina pectoris 08/05/2013  . Essential hypertension 08/05/2013  . Hyperlipidemia 08/05/2013  . Old MI (myocardial infarction) 08/05/2013  . Blunt abdominal trauma 10/26/2011  . Blunt chest trauma 10/26/2011  . Acute blood loss anemia 10/26/2011  . Chronic anemia 10/26/2011  . Fracture of iliac wing ([place]) 10/25/2011  . Multiple rib fractures 10/25/2011  . Adrenal hematoma 10/25/2011  . Pneumomediastinum ([place]) 10/25/2011    Past Surgical History:  Procedure Laterality Date  . CORONARY ANGIOPLASTY WITH STENT PLACEMENT         Home Medications    Prior to Admission medications   Medication Sig Start Date End Date Taking? Authorizing Provider  aspirin EC 81 MG tablet Take 81 mg by mouth daily.   Yes Historical Provider, MD  atorvastatin (LIPITOR) 40 MG tablet take 1/2 tablet by mouth once daily Patient taking differently: take 1/2 tablet by mouth once daily at bedtime 02/15/16  Yes Jerline Pain, MD  clonazePAM (KLONOPIN) 0.5 MG tablet Take 0.5 mg by mouth at bedtime.   Yes Historical  Provider, MD  OXYCONTIN 20 MG 12 hr tablet Take 20 mg by mouth daily as needed (pain).  01/25/16  Yes Historical Provider, MD  OXYCONTIN 30 MG T12A Take 30 mg by mouth at bedtime as needed (pain).  07/22/14  Yes Historical Provider, MD  diclofenac sodium (VOLTAREN) 1 % GEL Apply 2 g topically 4 (four) times daily.    Historical Provider, MD  docusate sodium 100 MG CAPS Take 200 mg by mouth 2 (two) times daily. 10/30/11   Lisette Abu, PA-C  escitalopram (LEXAPRO) 10 MG tablet Take 10 mg by mouth daily.    Historical Provider, MD  gabapentin (NEURONTIN) 100 MG capsule Take 100 mg by mouth 2 (two) times daily.  01/25/16   Historical Provider, MD  levothyroxine (SYNTHROID, LEVOTHROID) 50 MCG tablet Take 50 mcg by mouth daily.    Historical Provider, MD  oxyCODONE-acetaminophen (PERCOCET) 7.5-325 MG per tablet take 1 tablet by mouth three times a day if needed for pain 07/28/14   Historical Provider, MD    Family History Family History  Problem Relation Age of Onset  . Diabetes Mother   . Heart attack Father   . Heart attack Brother     x 2 brothers    Social History Social History  Substance Use Topics  . Smoking status: Current Some Day Smoker  . Smokeless tobacco: Current User  . Alcohol use No     Allergies   Codeine; Dilaudid [hydromorphone hcl]; and Morphine and related   Review of Systems Review of Systems  Constitutional: Negative for appetite change, chills, diaphoresis, fatigue and fever.  HENT: Negative for mouth sores, sore throat and trouble swallowing.   Eyes: Negative for visual disturbance.  Respiratory: Negative for cough, chest tightness, shortness of breath and wheezing.   Cardiovascular: Positive for chest pain.  Gastrointestinal: Positive for abdominal pain. Negative for abdominal distention, diarrhea, nausea and vomiting.  Endocrine: Negative for polydipsia, polyphagia and polyuria.  Genitourinary: Negative for dysuria, frequency and hematuria.  Musculoskeletal: Negative for gait problem.  Skin: Negative for color change, pallor and rash.  Neurological: Negative for dizziness, syncope, light-headedness and headaches.  Hematological: Does not bruise/bleed easily.  Psychiatric/Behavioral: Negative for behavioral problems and confusion.     Physical Exam Updated Vital Signs BP 173/91 (BP Location: Right Arm)   Pulse 66   Temp 98.9 F (37.2 C) (Oral)   Resp 13   Ht 5\' 11"  (1.803 m)   Wt 150 lb (68 kg)   SpO2 96%   BMI 20.92 kg/m   Physical Exam  Constitutional: He is oriented to person, place, and time. He appears well-developed and well-nourished. No  distress.  HENT:  Head: Normocephalic.  Eyes: Conjunctivae are normal. Pupils are equal, round, and reactive to light. No scleral icterus.  Neck: Normal range of motion. Neck supple. No thyromegaly present.  Cardiovascular: Normal rate and regular rhythm.  Exam reveals no gallop and no friction rub.   No murmur heard. Pulmonary/Chest: Effort normal and breath sounds normal. No respiratory distress. He has no wheezes. He has no rales.  Abdominal: Soft. Bowel sounds are normal. He exhibits no distension. There is no tenderness. There is no rebound.    Musculoskeletal: Normal range of motion.  Neurological: He is alert and oriented to person, place, and time.  Skin: Skin is warm and dry. No rash noted.  Psychiatric: He has a normal mood and affect. His behavior is normal.     ED Treatments / Results  Labs (all  labs ordered are listed, but only abnormal results are displayed) Labs Reviewed  BASIC METABOLIC PANEL - Abnormal; Notable for the following:       Result Value   Glucose, Bld 165 (*)    Creatinine, Ser 1.44 (*)    GFR calc non Af Amer 43 (*)    GFR calc Af Amer 50 (*)    All other components within normal limits  CBC - Abnormal; Notable for the following:    RBC 3.70 (*)    Hemoglobin 11.9 (*)    HCT 35.5 (*)    All other components within normal limits  LIPASE, BLOOD  HEPATIC FUNCTION PANEL  Randolm Idol, ED    EKG   Radiology Dg Chest 2 View  Result Date: 02/19/2016 CLINICAL DATA:  Chest pain. EXAM: CHEST  2 VIEW COMPARISON:  05/13/2014 FINDINGS: Cardiomediastinal silhouette is normal. Mediastinal contours appear intact. Tortuosity and calcific atherosclerotic disease of the aorta. There is no evidence of focal airspace consolidation, pleural effusion or pneumothorax. Osseous structures are without acute abnormality. Soft tissues are grossly normal. IMPRESSION: No active cardiopulmonary disease. Calcific atherosclerotic disease of the aorta and tortuosity.  Electronically Signed   By: Fidela Salisbury M.D.   On: 02/19/2016 13:37    Procedures Procedures (including critical care time)  Medications Ordered in ED Medications  fentaNYL (SUBLIMAZE) injection 75 mcg (not administered)  iopamidol (ISOVUE-370) 76 % injection (not administered)     Initial Impression / Assessment and Plan / ED Course  I have reviewed the triage vital signs and the nursing notes.  Pertinent labs & imaging results that were available during my care of the patient were reviewed by me and considered in my medical decision making (see chart for details).     Symptoms sound consistent with probable exacerbation of chronic pain. However with known abdominal aortic aneurysm and chest and abdominal pain we'll CT his chest and abdomen. He does take Creon. He is to take this regularly and had helped his pain. He has not taken for a while because he "didn't need it.".  19:01:  CT read as aortic dissection small segment just above dictation. Small aneurysmal dilatation without change. On exam the patient has excellent perfusion to his legs with good cap refill sensation and pulses. Also read as slight dilatation of the appendix with minimal inflammation. I discussed the case with Dr. Grandville Silos of Gen. surgery. On reexam the patient does not have isolated nor evolving right lower quadrant tenderness. After IV fentanyl he is essentially asymptomatic. Care also discussed with Dr. Kasandra Knudsen of vascular surgery. He will see the patient in consult. Patient was started on a Cardene drip. I will plan titrating Cardene for local of systolic [id number] or a map of 65. Care discussed with Dr. Christinia Gully applied critical care. Patient be admitted to the ICU for blood pressure control. Consultations as above.   Final Clinical Impressions(s) / ED Diagnoses   Final diagnoses:  Back pain  Chest pain    CRITICAL CARE Performed by: Tanna Furry JOSEPH   Total critical care time: 30  minutes  Critical care time was exclusive of separately billable procedures and treating other patients.  Critical care was necessary to treat or prevent imminent or life-threatening deterioration.  Critical care was time spent personally by me on the following activities: development of treatment plan with patient and/or surrogate as well as nursing, discussions with consultants, evaluation of patient's response to treatment, examination of patient, obtaining history from patient or surrogate,  ordering and performing treatments and interventions, ordering and review of laboratory studies, ordering and review of radiographic studies, pulse oximetry and re-evaluation of patient's condition.   New Prescriptions New Prescriptions   No medications on file     Tanna Furry, MD 02/19/16 [phone]

## 2016-02-19 NOTE — H&P (Signed)
PULMONARY / CRITICAL CARE MEDICINE   Name: Paul Odonnell MRN: [id number] DOB: 02/20/1930    ADMISSION DATE:  02/19/2016  CHIEF COMPLAINT:  Abdominal pain  HISTORY OF PRESENT ILLNESS:   Mr. Paul Odonnell is an 83 y/o man with a history of a AAA, CAD s/p NSTEMI with DES to LAD in March 2009, T2DM, HTN, and HLD who presented to the ED on 1/28 with complaint of abdominal pain. Patient states that on 1/27 at 1700, he developed abdominal pain after eating. He describes the pain as sharp and severe extending from inferior of his umbilicus superiorly to his chest and posteriorly to his back. At its worst, pain is a 10/10, currently it is a 7/10 after pain medication. He denies any associated symptoms including fever, chills, chest pain, shortness of breath, cough, nausea, vomiting, constipation, headache or lightheadedness. He does admit to loose stools over the past 2 days, but denies any today. Due to the lack of improvement in pain, patient and his wife came to the ED today. Of note, patient's BP medications were (Bystolic) was stopped in July 2017 due to low BP.   In the ED, patient was afebrile, hypertensive to 165/79, HR 78, RR 17 and satting well on room air. BMET shows creatinine of 1.44. CBC is without leukocytosis, hgb 11.9. EKG shows a RBBB, NSR. CTA chest/abd showed an enlarged appendix with mild adjacent inflammation likely representing acute appendicitis. However, given his lack of fever, leukocytosis, RLQ pain - this was felt to be incidental. However, surgery is following and patient has been started on Zosyn. Follow clinicallys. More concerning was a small focal dissection involving the anterior aspect of the distal abdominal aorta with abdominal aortic aneurysm measuring 3.3 cm at this level. No evidence of adjacent inflammation or hematoma. Patient was started on Cardizem gtt and Vascular surgery was consulted.   PAST MEDICAL HISTORY :  He  has a past medical history of AAA (abdominal aortic  aneurysm) ([place]); Anemia; Asthma; BPH (benign prostatic hyperplasia); CAD (coronary artery disease); Depression; Diverticulosis; DM (diabetes mellitus) ([place]); Gastritis; GERD (gastroesophageal reflux disease); HH (hiatus hernia); HTN (hypertension); Hypothyroidism; IBS (irritable bowel syndrome); MI (myocardial infarction); Osteoarthritis; Pancreatitis; Peptic stricture of esophagus; Prostate cancer ([place]); Pure hypercholesterolemia; Renal insufficiency; Sepsis due to Pseudomonas ([place]); Urinary obstruction; and Vitamin B 12 deficiency.  PAST SURGICAL HISTORY: He  has a past surgical history that includes Coronary angioplasty with stent.  Allergies  Allergen Reactions  . Codeine Other (See Comments)    Pancreatic problems   . Dilaudid [Hydromorphone Hcl] Other (See Comments)    Affects patient's thinking  . Morphine And Related Other (See Comments)    Affects patient's thinking    No current facility-administered medications on file prior to encounter.    Current Outpatient Prescriptions on File Prior to Encounter  Medication Sig  . atorvastatin (LIPITOR) 40 MG tablet take 1/2 tablet by mouth once daily (Patient taking differently: take 1/2 tablet by mouth once daily at bedtime)  . clonazePAM (KLONOPIN) 0.5 MG tablet Take 0.5 mg by mouth at bedtime.  . docusate sodium 100 MG CAPS Take 200 mg by mouth 2 (two) times daily. (Patient taking differently: Take 400 mg by mouth daily with supper. )  . escitalopram (LEXAPRO) 10 MG tablet Take 10 mg by mouth daily.  Marland Kitchen levothyroxine (SYNTHROID, LEVOTHROID) 50 MCG tablet Take 50 mcg by mouth daily before breakfast.   . OXYCONTIN 30 MG T12A Take 30 mg by mouth at bedtime as needed (pain).  FAMILY HISTORY:  His indicated that his mother is deceased. He indicated that his father is deceased. He indicated that the status of his brother is unknown. He indicated that his maternal grandmother is deceased. He indicated that his maternal grandfather is  deceased. He indicated that his paternal grandmother is deceased. He indicated that his paternal grandfather is deceased.   SOCIAL HISTORY: He  reports that he has been smoking.  He uses smokeless tobacco. He reports that he does not drink alcohol or use drugs.  REVIEW OF SYSTEMS:   A complete ROS was negative except as per HPI.   HEMODYNAMICS:    VENTILATOR SETTINGS:    INTAKE / OUTPUT: No intake/output data recorded.  PHYSICAL EXAMINATION: Vitals:   02/19/16 1900 02/19/16 1930 02/19/16 1945 02/19/16 2000  BP: 134/61 141/64 153/78 126/71  Pulse: 93 100 91 86  Resp: 19 19 19 18   Temp:      TempSrc:      SpO2: 96% 93% 95% 94%  Weight:      Height:       General: Vital signs reviewed.  Patient is elderly, in no acute distress and cooperative with exam.  HEENT: Atraumatic, PERRLA, moist mucous membranes. No JVD. No carotid bruits.  Cardiovascular: Tachycardic, regular rhythm, 2/6 SEM. Pulmonary/Chest: Clear to auscultation bilaterally, no wheezes, rales, or rhonchi. Abdominal: Soft, tender to palpation in epigastric and umbilical regions, non-distended, BS +, guarding present. No RLQ tenderness. Negative McBurney's.  Extremities: No lower extremity edema bilaterally, pulses symmetric and intact bilaterally.  Neurological: A&O x3, answers questions appropriately Skin: Warm, dry and intact. No rashes or erythema. Psychiatric: Normal mood and affect.   LABS:  BMET  Recent Labs Lab 02/19/16 1252 02/19/16 1929  NA 141  --   K 3.7  --   CL 107  --   CO2 25  --   BUN 17  --   CREATININE 1.44* 1.31*  GLUCOSE 165*  --     Electrolytes  Recent Labs Lab 02/19/16 1252 02/19/16 1929  CALCIUM 9.3  --   MG  --  2.0  PHOS  --  2.5    CBC  Recent Labs Lab 02/19/16 1252 02/19/16 1929  WBC 5.4 6.0  HGB 11.9* 12.3*  HCT 35.5* 36.5*  PLT 197 180    Coag's No results for input(s): APTT, INR in the last 168 hours.  Sepsis Markers No results for input(s):  LATICACIDVEN, PROCALCITON, O2SATVEN in the last 168 hours.  ABG No results for input(s): PHART, PCO2ART, PO2ART in the last 168 hours.  Liver Enzymes  Recent Labs Lab 02/19/16 1252  AST 21  ALT 14*  ALKPHOS 76  BILITOT 0.5  ALBUMIN 3.9    Cardiac Enzymes No results for input(s): TROPONINI, PROBNP in the last 168 hours.  Glucose No results for input(s): GLUCAP in the last 168 hours.  Imaging Dg Chest 2 View  Result Date: 02/19/2016 CLINICAL DATA:  Chest pain. EXAM: CHEST  2 VIEW COMPARISON:  05/13/2014 FINDINGS: Cardiomediastinal silhouette is normal. Mediastinal contours appear intact. Tortuosity and calcific atherosclerotic disease of the aorta. There is no evidence of focal airspace consolidation, pleural effusion or pneumothorax. Osseous structures are without acute abnormality. Soft tissues are grossly normal. IMPRESSION: No active cardiopulmonary disease. Calcific atherosclerotic disease of the aorta and tortuosity. Electronically Signed   By: Fidela Salisbury M.D.   On: 02/19/2016 13:37   Ct Angio Chest/abd/pel For Dissection W And/or W/wo  Result Date: 02/19/2016 CLINICAL DATA:  83 year old male  with chest, abdominal and pelvic pain radiating to back for 1 day. EXAM: CT ANGIOGRAPHY CHEST, ABDOMEN AND PELVIS TECHNIQUE: Multidetector CT imaging through the chest, abdomen and pelvis was performed using the standard protocol during bolus administration of intravenous contrast. Multiplanar reconstructed images and MIPs were obtained and reviewed to evaluate the vascular anatomy. CONTRAST:  80 cc intravenous Isovue 370 COMPARISON:  02/19/2016 chest radiograph.  04/27/2014 CT. FINDINGS: CTA CHEST FINDINGS Cardiovascular: Preferential opacification of the thoracic aorta. There is no evidence of thoracic aortic aneurysm or dissection. Cardiomegaly noted. Coronary artery and thoracic aortic atherosclerotic calcifications are present. There is no evidence of pericardial effusion.  Mediastinum/Nodes: No enlarged mediastinal, hilar, or axillary lymph nodes. Thyroid gland, trachea, and esophagus demonstrate no significant findings. Lungs/Pleura: Lungs are clear. No pleural effusion or pneumothorax. Musculoskeletal: No chest wall abnormality. No acute or significant osseous findings. Review of the MIP images confirms the above findings. CTA ABDOMEN AND PELVIS FINDINGS VASCULAR A small focal dissection (1 cm in length) involving the anterior aspect of the distal abdominal aorta is noted (images 215-218). The abdominal aorta measures 3.3 cm in greatest diameter at this level. There is no evidence of dissection extending into the common iliac arteries are branch vessels. No adjacent hematoma is identified. Aortic atherosclerotic calcifications are noted. The celiac artery, mesenteric arteries, renal arteries and iliac arteries are patent. It is difficult to determine if this dissection was present on the prior study given prior contrast opacification. Review of the MIP images confirms the above findings. NON-VASCULAR Hepatobiliary: The liver and gallbladder are unremarkable. There is no evidence of biliary dilatation. Pancreas: Unremarkable Spleen: Unremarkable Adrenals/Urinary Tract: Bilateral renal cortical thinning noted. The adrenal glands and bladder are unremarkable. Stomach/Bowel: The appendix is enlarged (10 mm in diameter) with mild periappendiceal stranding. There is no evidence of bowel obstruction or pneumoperitoneum. Lymphatic: No enlarged lymph nodes identified. Reproductive: Prostate is unremarkable. Other: No free fluid or focal collection. Musculoskeletal: No acute or significant osseous findings. Review of the MIP images confirms the above findings. IMPRESSION: Enlarged appendix with mild adjacent inflammation likely representing acute appendicitis. No evidence free fluid, abscess or pneumoperitoneum. Small focal dissection involving the anterior aspect of the distal abdominal  aorta with abdominal aortic aneurysm measuring 3.3 cm at this level. No evidence of adjacent inflammation or hematoma. It is uncertain if this dissection was present on the prior study given prior contrast opacification of the aorta. No evidence of thoracic aortic aneurysm or dissection. Dr. Claretta Fraise notified these results at 5:10 p.m. on 02/19/2016. Electronically Signed   By: Margarette Canada M.D.   On: 02/19/2016 17:10    STUDIES:  CTA Chest/Abd 1/28 >> Enlarged appendix with mild adjacent inflammation likely representing acute appendicitis. No evidence free fluid, abscess or pneumoperitoneum. Small focal dissection involving the anterior aspect of the distal abdominal aorta with abdominal aortic aneurysm measuring 3.3 cm at this level. No evidence of adjacent inflammation or hematoma. It is uncertain if this dissection was present on the prior study given prior contrast opacification of the aorta. No evidence of thoracic aortic aneurysm or dissection.  CULTURES: None  ANTIBIOTICS: Zosyn 1/28 >>  SIGNIFICANT EVENTS: 1/28  >> admitted for AAA  LINES/TUBES:   DISCUSSION: Mr. Vivian is an 83 y/o man with a history of a AAA, CAD s/p NSTEMI with DES to LAD in March 2009, T2DM, HTN, and HLD who presented to the ED on 1/28 with complaint of abdominal pain and found to have a small focal AAA dissection. Also,  CT showed possible appendicitis.  ASSESSMENT / PLAN:  PULMONARY A: No acute issues P:   Monitor  CARDIOVASCULAR A:  Small Focal AAA Dissection CAD s/p NSTEMI with DES to LAD in 2009 HTN  HLD P:  Esmolol gtt (Maintain SBP 100-110 and HR <60) Add Nicardipine gtt if not at goal Morphine 2 mg Q2H prn  Place A-line Place foley Vascular Surgery following Echocardiogram ASA 81 mg QD Atorvastatin 20 mg QD  RENAL A:   CKD III P:   Monitor with BMET  GASTROINTESTINAL A:   Recent Loose Stools P:   Monitor Heart Healthy Diet Colace 100 mg BID prn   HEMATOLOGIC A:    Chronic Normocytic Anemia P:  Monitor on CBC Heparin TID SQ  INFECTIOUS A:   Possible Appendicitis: Seen on CT Scan, but does not fit clinically P:   On Zosyn  Monitor fever curve and WBC General Surgery following  ENDOCRINE A:   T2DM Hypothyroidism P:   SSI-S  CBG Q4H Levothyroxine 50 mcg QD  NEUROLOGIC A:   Depression and Anxiety P:   Continue home Lexapro 10 mg QAM and Klonopin 0.5 mg QHS Gabapentin 100 mg BID  FAMILY  - Updates: patient and wife updated at bedside. Patient is a DNR - Inter-disciplinary family meet or Palliative Care meeting due by:  02/25/16  Martyn Malay, DO PGY-3 Internal Medicine Resident Pager # [phone] 02/19/2016 8:30 PM

## 2016-02-19 NOTE — ED Notes (Signed)
Patient transported to CT 

## 2016-02-19 NOTE — Progress Notes (Signed)
Pharmacy Antibiotic Note  Paul Odonnell is a 83 y.o. male admitted on 02/19/2016 with inta-abdominal infx.  .  Plan: Zosyn 3.375 gm iv q8h  Height: 5\' 11"  (180.3 cm) Weight: 150 lb (68 kg) IBW/kg (Calculated) : 75.3  Temp (24hrs), Avg:98.9 F (37.2 C), Min:98.9 F (37.2 C), Max:98.9 F (37.2 C)   Recent Labs Lab 02/19/16 1252 02/19/16 1929  WBC 5.4 6.0  CREATININE 1.44* 1.31*    Estimated Creatinine Clearance: 41.1 mL/min (by C-G formula based on SCr of 1.31 mg/dL (H)).    Allergies  Allergen Reactions  . Codeine Other (See Comments)    Pancreatic problems   . Dilaudid [Hydromorphone Hcl] Other (See Comments)    Affects patient's thinking  . Morphine And Related Other (See Comments)    Affects patient's thinking   Levester Fresh, PharmD, BCPS, BCCCP Clinical Pharmacist 02/19/2016 8:26 PM

## 2016-02-19 NOTE — ED Triage Notes (Signed)
Pt. Stated, I think I might be having a light heart attack , my chest is hurting and my stomach , I just feel bad all over.

## 2016-02-19 NOTE — Consult Note (Signed)
Reason for Consult: Abdominal pain and dilated appendix Referring Physician: Finnbar Odonnell is an 83 y.o. male.  HPI: Paul Odonnell complains of periumbilical abdominal pain starting yesterday at 5 PM. It extended superiorly towards his heart. He denies previous episodes. His cardiologist is Dr. Candee Furbish. He came to the emergency department for further evaluation. He was found to be hypertensive. White blood cell count is normal. CT scan of the abdomen and pelvis demonstrated a small aortic dissection at the level of his umbilicus. Additionally, his appendix was seen to be enlarged so I was asked to see him in consultation. He claims his abdominal pain has resolved since he received some pain medication.  Past Medical History:  Diagnosis Date  . AAA (abdominal aortic aneurysm) ([place])   . Anemia   . Asthma   . BPH (benign prostatic hyperplasia)   . CAD (coronary artery disease)   . Depression   . Diverticulosis   . DM (diabetes mellitus) ([place])   . Gastritis   . GERD (gastroesophageal reflux disease)   . HH (hiatus hernia)   . HTN (hypertension)   . Hypothyroidism   . IBS (irritable bowel syndrome)   . MI (myocardial infarction)   . Osteoarthritis   . Pancreatitis    with pancreas divisum  . Peptic stricture of esophagus   . Prostate cancer ([place])    radiation 2011  . Pure hypercholesterolemia   . Renal insufficiency   . Sepsis due to Pseudomonas ([place])    pyelonephritis  . Urinary obstruction   . Vitamin B 12 deficiency     Past Surgical History:  Procedure Laterality Date  . CORONARY ANGIOPLASTY WITH STENT PLACEMENT      Family History  Problem Relation Age of Onset  . Diabetes Mother   . Heart attack Father   . Heart attack Brother     x 2 brothers    Social History:  reports that he has been smoking.  He uses smokeless tobacco. He reports that he does not drink alcohol or use drugs.  Allergies:  Allergies  Allergen Reactions  . Codeine Other (See  Comments)    Pancreatic problems   . Dilaudid [Hydromorphone Hcl] Other (See Comments)    Affects patient's thinking  . Morphine And Related Other (See Comments)    Affects patient's thinking    Medications: I have reviewed the patient's current medications.  Results for orders placed or performed during the hospital encounter of 02/19/16 (from the past 48 hour(s))  Basic metabolic panel     Status: Abnormal   Collection Time: 02/19/16 12:52 PM  Result Value Ref Range   Sodium 141 135 - 145 mmol/L   Potassium 3.7 3.5 - 5.1 mmol/L   Chloride 107 101 - 111 mmol/L   CO2 25 22 - 32 mmol/L   Glucose, Bld 165 (H) 65 - 99 mg/dL   BUN 17 6 - 20 mg/dL   Creatinine, Ser 1.44 (H) 0.61 - 1.24 mg/dL   Calcium 9.3 8.9 - 10.3 mg/dL   GFR calc non Af Amer 43 (L) >60 mL/min   GFR calc Af Amer 50 (L) >60 mL/min    Comment: (NOTE) The eGFR has been calculated using the CKD EPI equation. This calculation has not been validated in all clinical situations. eGFR's persistently <60 mL/min signify possible Chronic Kidney Disease.    Anion gap 9 5 - 15  CBC     Status: Abnormal   Collection Time: 02/19/16 12:52 PM  Result Value Ref Range   WBC 5.4 4.0 - 10.5 K/uL   RBC 3.70 (L) 4.22 - 5.81 MIL/uL   Hemoglobin 11.9 (L) 13.0 - 17.0 g/dL   HCT 35.5 (L) 39.0 - 52.0 %   MCV 95.9 78.0 - 100.0 fL   MCH 32.2 26.0 - 34.0 pg   MCHC 33.5 30.0 - 36.0 g/dL   RDW 13.0 11.5 - 15.5 %   Platelets 197 150 - 400 K/uL  Lipase, blood     Status: None   Collection Time: 02/19/16 12:52 PM  Result Value Ref Range   Lipase 24 11 - 51 U/L  Hepatic function panel     Status: Abnormal   Collection Time: 02/19/16 12:52 PM  Result Value Ref Range   Total Protein 6.8 6.5 - 8.1 g/dL   Albumin 3.9 3.5 - 5.0 g/dL   AST 21 15 - 41 U/L   ALT 14 (L) 17 - 63 U/L   Alkaline Phosphatase 76 38 - 126 U/L   Total Bilirubin 0.5 0.3 - 1.2 mg/dL   Bilirubin, Direct <0.1 (L) 0.1 - 0.5 mg/dL   Indirect Bilirubin NOT CALCULATED 0.3  - 0.9 mg/dL  I-stat troponin, ED     Status: None   Collection Time: 02/19/16  1:09 PM  Result Value Ref Range   Troponin i, poc 0.01 0.00 - 0.08 ng/mL   Comment 3            Comment: Due to the release kinetics of cTnI, a negative result within the first hours of the onset of symptoms does not rule out myocardial infarction with certainty. If myocardial infarction is still suspected, repeat the test at appropriate intervals.     Dg Chest 2 View  Result Date: 02/19/2016 CLINICAL DATA:  Chest pain. EXAM: CHEST  2 VIEW COMPARISON:  05/13/2014 FINDINGS: Cardiomediastinal silhouette is normal. Mediastinal contours appear intact. Tortuosity and calcific atherosclerotic disease of the aorta. There is no evidence of focal airspace consolidation, pleural effusion or pneumothorax. Osseous structures are without acute abnormality. Soft tissues are grossly normal. IMPRESSION: No active cardiopulmonary disease. Calcific atherosclerotic disease of the aorta and tortuosity. Electronically Signed   By: Fidela Salisbury M.D.   On: 02/19/2016 13:37   Ct Angio Chest/abd/pel For Dissection W And/or W/wo  Result Date: 02/19/2016 CLINICAL DATA:  83 year old male with chest, abdominal and pelvic pain radiating to back for 1 day. EXAM: CT ANGIOGRAPHY CHEST, ABDOMEN AND PELVIS TECHNIQUE: Multidetector CT imaging through the chest, abdomen and pelvis was performed using the standard protocol during bolus administration of intravenous contrast. Multiplanar reconstructed images and MIPs were obtained and reviewed to evaluate the vascular anatomy. CONTRAST:  80 cc intravenous Isovue 370 COMPARISON:  02/19/2016 chest radiograph.  04/27/2014 CT. FINDINGS: CTA CHEST FINDINGS Cardiovascular: Preferential opacification of the thoracic aorta. There is no evidence of thoracic aortic aneurysm or dissection. Cardiomegaly noted. Coronary artery and thoracic aortic atherosclerotic calcifications are present. There is no evidence  of pericardial effusion. Mediastinum/Nodes: No enlarged mediastinal, hilar, or axillary lymph nodes. Thyroid gland, trachea, and esophagus demonstrate no significant findings. Lungs/Pleura: Lungs are clear. No pleural effusion or pneumothorax. Musculoskeletal: No chest wall abnormality. No acute or significant osseous findings. Review of the MIP images confirms the above findings. CTA ABDOMEN AND PELVIS FINDINGS VASCULAR A small focal dissection (1 cm in length) involving the anterior aspect of the distal abdominal aorta is noted (images 215-218). The abdominal aorta measures 3.3 cm in greatest diameter at this level. There is  no evidence of dissection extending into the common iliac arteries are branch vessels. No adjacent hematoma is identified. Aortic atherosclerotic calcifications are noted. The celiac artery, mesenteric arteries, renal arteries and iliac arteries are patent. It is difficult to determine if this dissection was present on the prior study given prior contrast opacification. Review of the MIP images confirms the above findings. NON-VASCULAR Hepatobiliary: The liver and gallbladder are unremarkable. There is no evidence of biliary dilatation. Pancreas: Unremarkable Spleen: Unremarkable Adrenals/Urinary Tract: Bilateral renal cortical thinning noted. The adrenal glands and bladder are unremarkable. Stomach/Bowel: The appendix is enlarged (10 mm in diameter) with mild periappendiceal stranding. There is no evidence of bowel obstruction or pneumoperitoneum. Lymphatic: No enlarged lymph nodes identified. Reproductive: Prostate is unremarkable. Other: No free fluid or focal collection. Musculoskeletal: No acute or significant osseous findings. Review of the MIP images confirms the above findings. IMPRESSION: Enlarged appendix with mild adjacent inflammation likely representing acute appendicitis. No evidence free fluid, abscess or pneumoperitoneum. Small focal dissection involving the anterior aspect of  the distal abdominal aorta with abdominal aortic aneurysm measuring 3.3 cm at this level. No evidence of adjacent inflammation or hematoma. It is uncertain if this dissection was present on the prior study given prior contrast opacification of the aorta. No evidence of thoracic aortic aneurysm or dissection. Dr. Claretta Fraise notified these results at 5:10 p.m. on 02/19/2016. Electronically Signed   By: Margarette Canada M.D.   On: 02/19/2016 17:10    Review of Systems  Constitutional: Negative for chills and fever.  HENT: Negative.   Eyes: Negative for blurred vision.  Respiratory: Negative for cough and shortness of breath.   Cardiovascular: Positive for chest pain.  Gastrointestinal: Positive for abdominal pain. Negative for blood in stool, constipation, nausea and vomiting.  Musculoskeletal: Negative.   Skin: Negative.   Neurological: Negative for focal weakness and loss of consciousness.  Endo/Heme/Allergies: Negative.   Psychiatric/Behavioral: Negative.    Blood pressure 190/94, pulse 71, temperature 98.9 F (37.2 C), temperature source Oral, resp. rate 17, height _0  (1.803 m), weight 68 kg (150 lb), SpO2 95 %. Physical Exam  Constitutional: He is oriented to person, place, and time. He appears well-developed and well-nourished.  HENT:  Head: Normocephalic.  Right Ear: External ear normal.  Left Ear: External ear normal.  Nose: Nose normal.  Mouth/Throat: Oropharynx is clear and moist.  Eyes: EOM are normal. Pupils are equal, round, and reactive to light.  Neck: Neck supple. No tracheal deviation present.  Cardiovascular: Normal rate, regular rhythm and normal heart sounds.   Respiratory: No respiratory distress. He has no wheezes. He has no rales.  GI: Soft. He exhibits no distension. There is no tenderness. There is no rebound and no guarding.  No tenderness in the right lower quadrant, no other significant tenderness  Musculoskeletal: Normal range of motion. He exhibits no  deformity.  Neurological: He is alert and oriented to person, place, and time.  Skin: Skin is warm and dry.  Psychiatric: He has a normal mood and affect.    Assessment/Plan: Dilated appendix - no significant tenderness in that area. WBC normal. Recommend bowel rest and IV antibiotics (Zosyn). We will re-examine in the AM. Aortic dissection - the location of this matches his pain. Agree with medical admission, blood pressure control, and vascular surgery evaluation.  Rissie Sculley E 02/19/2016, 5:43 PM

## 2016-02-19 NOTE — ED Notes (Signed)
Called to lab, they are adding on Hepatic function panel.

## 2016-02-19 NOTE — Consult Note (Signed)
Hospital Consult   History of Present Illness: This is a 83 y.o. male presented to ED with abdominal pain since Friday. He began having diarrhea on that day. Today pain reached moderate to severe level and so he sought medical attention. Has not had nausea or emesis. Pain is located central abdomen and does not radiate although he does have chronic back pain. Denies fevers. Associated anorexia for 2 days at home. Alleviated with pain medicine at hospital. Has never had similar pain. Legs feel normal. No constitutional symptoms.  Past Medical History:  Diagnosis Date  . AAA (abdominal aortic aneurysm) ([place])   . Anemia   . Asthma   . BPH (benign prostatic hyperplasia)   . CAD (coronary artery disease)   . Depression   . Diverticulosis   . DM (diabetes mellitus) ([place])   . Gastritis   . GERD (gastroesophageal reflux disease)   . HH (hiatus hernia)   . HTN (hypertension)   . Hypothyroidism   . IBS (irritable bowel syndrome)   . MI (myocardial infarction)   . Osteoarthritis   . Pancreatitis    with pancreas divisum  . Peptic stricture of esophagus   . Prostate cancer ([place])    radiation 2011  . Pure hypercholesterolemia   . Renal insufficiency   . Sepsis due to Pseudomonas ([place])    pyelonephritis  . Urinary obstruction   . Vitamin B 12 deficiency     Past Surgical History:  Procedure Laterality Date  . CORONARY ANGIOPLASTY WITH STENT PLACEMENT      Allergies  Allergen Reactions  . Codeine Other (See Comments)    Pancreatic problems   . Dilaudid [Hydromorphone Hcl] Other (See Comments)    Affects patient's thinking  . Morphine And Related Other (See Comments)    Affects patient's thinking    Prior to Admission medications   Medication Sig Start Date End Date Taking? Authorizing Provider  Ascorbic Acid (VITAMIN C) 1000 MG tablet Take 1,000 mg by mouth daily.   Yes Historical Provider, MD  aspirin EC 81 MG tablet Take 81 mg by mouth daily.   Yes Historical Provider, MD    atorvastatin (LIPITOR) 40 MG tablet take 1/2 tablet by mouth once daily Patient taking differently: take 1/2 tablet by mouth once daily at bedtime 02/15/16  Yes Jerline Pain, MD  B Complex-C (SUPER B COMPLEX PO) Take 1 tablet by mouth daily.   Yes Historical Provider, MD  Cholecalciferol (VITAMIN D PO) Take 1 tablet by mouth daily.   Yes Historical Provider, MD  clonazePAM (KLONOPIN) 0.5 MG tablet Take 0.5 mg by mouth at bedtime.   Yes Historical Provider, MD  docusate sodium 100 MG CAPS Take 200 mg by mouth 2 (two) times daily. Patient taking differently: Take 400 mg by mouth daily with supper.  10/30/11  Yes Lisette Abu, PA-C  escitalopram (LEXAPRO) 10 MG tablet Take 10 mg by mouth daily.   Yes Historical Provider, MD  gabapentin (NEURONTIN) 100 MG capsule Take 100 mg by mouth 2 (two) times daily. 01/25/16  Yes Historical Provider, MD  levothyroxine (SYNTHROID, LEVOTHROID) 50 MCG tablet Take 50 mcg by mouth daily before breakfast.    Yes Historical Provider, MD  Multiple Vitamin (MULTIVITAMIN WITH MINERALS) TABS tablet Take 1 tablet by mouth daily.   Yes Historical Provider, MD  naproxen sodium (ALEVE) 220 MG tablet Take 220 mg by mouth daily as needed (headache/ arthritis pain).   Yes Historical Provider, MD  OVER THE COUNTER MEDICATION Take  1 tablet by mouth 2 (two) times daily. OTC online supplement for knee pain - contains glucosamine, turmeric, bark, ginger etc.   Yes Historical Provider, MD  OXYCONTIN 20 MG 12 hr tablet Take 20 mg by mouth daily as needed (pain).  01/25/16  Yes Historical Provider, MD  OXYCONTIN 30 MG T12A Take 30 mg by mouth at bedtime as needed (pain).  07/22/14  Yes Historical Provider, MD    Social History   Social History  . Marital status: Married    Spouse name: N/A  . Number of children: N/A  . Years of education: N/A   Occupational History  . Not on file.   Social History Main Topics  . Smoking status: Current Some Day Smoker  . Smokeless tobacco:  Current User  . Alcohol use No  . Drug use: No  . Sexual activity: Not on file   Other Topics Concern  . Not on file   Social History Narrative   Tobacco use cigarettes: Former Smoker. Quit in 1975   No Alcohol   Occupation: Used to work for [employer] as an Chief Financial Officer   Marital Status: Married           Family History  Problem Relation Age of Onset  . Diabetes Mother   . Heart attack Father   . Heart attack Brother     x 2 brothers    ROS: [x]  Positive   [ ]  Negative   [ ]  All sytems reviewed and are negative  Cardiovascular: []  chest pain/pressure []  palpitations []  SOB lying flat []  DOE []  pain in legs while walking []  pain in legs at rest []  pain in legs at night []  non-healing ulcers []  hx of DVT []  swelling in legs  Pulmonary: []  productive cough []  asthma/wheezing []  home O2  Neurologic: []  weakness in []  arms []  legs []  numbness in []  arms []  legs []  hx of CVA []  mini stroke [] difficulty speaking or slurred speech []  temporary loss of vision in one eye []  dizziness  Hematologic: [x]  hx of cancer []  bleeding problems []  problems with blood clotting easily  Endocrine:   []  diabetes []  thyroid disease  GI []  vomiting blood []  blood in stool [x]  diarrhea  GU: []  CKD/renal failure []  HD--[]  M/W/F or []  T/T/S []  burning with urination []  blood in urine  Psychiatric: []  anxiety []  depression  Musculoskeletal: [x]  arthritis []  joint pain  Integumentary: []  rashes []  ulcers  Constitutional: []  fever []  chills   Physical Examination  Vitals:   02/19/16 2015 02/19/16 2030  BP:  (!) 159/116  Pulse: 97 74  Resp: 24 18  Temp:  (!) 101.3 F (38.5 C)   Body mass index is 20.23 kg/m.  General:  WDWN in NAD Gait: Not observed HENT: WNL, normocephalic Pulmonary: normal non-labored breathing Cardiac: palpable radial, femoral, popliteal, pt bilaterally Abdomen: soft, minimal tenderness to deep palpation, palpable  aorta Extremities: without ischemic changes, without Gangrene , without cellulitis; without open wounds;  Musculoskeletal: no muscle wasting or atrophy  Neurologic: A&O X 3; Appropriate Affect ; SENSATION: normal; MOTOR FUNCTION:  moving all extremities equally. Speech is fluent/normal   CBC    Component Value Date/Time   WBC 6.0 02/19/2016 1929   RBC 3.84 (L) 02/19/2016 1929   HGB 12.3 (L) 02/19/2016 1929   HCT 36.5 (L) 02/19/2016 1929   PLT 180 02/19/2016 1929   MCV 95.1 02/19/2016 1929   MCH 32.0 02/19/2016 1929   MCHC 33.7 02/19/2016 1929  RDW 13.1 02/19/2016 1929   LYMPHSABS 1.0 04/27/2014 0845   MONOABS 0.5 04/27/2014 0845   EOSABS 0.1 04/27/2014 0845   BASOSABS 0.0 04/27/2014 0845    BMET    Component Value Date/Time   NA 141 02/19/2016 1252   K 3.7 02/19/2016 1252   CL 107 02/19/2016 1252   CO2 25 02/19/2016 1252   GLUCOSE 165 (H) 02/19/2016 1252   BUN 17 02/19/2016 1252   CREATININE 1.31 (H) 02/19/2016 1929   CALCIUM 9.3 02/19/2016 1252   GFRNONAA 49 (L) 02/19/2016 1929   GFRAA 56 (L) 02/19/2016 1929    COAGS: Lab Results  Component Value Date   INR 1.07 10/25/2011   INR 1.7 (H) 08/07/2007   INR 1.1 04/02/2007     Non-Invasive Vascular Imaging:   IMPRESSION: Enlarged appendix with mild adjacent inflammation likely representing acute appendicitis. No evidence free fluid, abscess or pneumoperitoneum.  Small focal dissection involving the anterior aspect of the distal abdominal aorta with abdominal aortic aneurysm measuring 3.3 cm at this level. No evidence of adjacent inflammation or hematoma. It is uncertain if this dissection was present on the prior study given prior contrast opacification of the aorta.  No evidence of thoracic aortic aneurysm or dissection.   ASSESSMENT/PLAN: This is a 83 y.o. male here with central abdominal pain with associated diarrhea and hypertension with CT evidence of terminal aortic dissection with associated known  small AAA. He needs bp management and pain control both of which are currently ongoing in the icu. Would not plan to rescan him this admission unless pain control becomes an issue. Will follow.    Brandon C. Donzetta Matters, MD [facility] Office: [phone] Pager: [phone]

## 2016-02-20 ENCOUNTER — Inpatient Hospital Stay (HOSPITAL_COMMUNITY): Payer: Medicare Other

## 2016-02-20 ENCOUNTER — Other Ambulatory Visit: Payer: Self-pay | Admitting: *Deleted

## 2016-02-20 DIAGNOSIS — I7102 Dissection of abdominal aorta: Secondary | ICD-10-CM

## 2016-02-20 DIAGNOSIS — I7101 Dissection of thoracic aorta: Secondary | ICD-10-CM

## 2016-02-20 LAB — CBC WITH DIFFERENTIAL/PLATELET
BASOS PCT: 0 %
Basophils Absolute: 0 10*3/uL (ref 0.0–0.1)
EOS ABS: 0 10*3/uL (ref 0.0–0.7)
EOS PCT: 0 %
HCT: 33.1 % — ABNORMAL LOW (ref 39.0–52.0)
HEMOGLOBIN: 11.2 g/dL — AB (ref 13.0–17.0)
Lymphocytes Relative: 25 %
Lymphs Abs: 1.8 10*3/uL (ref 0.7–4.0)
MCH: 32.1 pg (ref 26.0–34.0)
MCHC: 33.8 g/dL (ref 30.0–36.0)
MCV: 94.8 fL (ref 78.0–100.0)
MONOS PCT: 6 %
Monocytes Absolute: 0.4 10*3/uL (ref 0.1–1.0)
NEUTROS PCT: 69 %
Neutro Abs: 5 10*3/uL (ref 1.7–7.7)
PLATELETS: 186 10*3/uL (ref 150–400)
RBC: 3.49 MIL/uL — AB (ref 4.22–5.81)
RDW: 13 % (ref 11.5–15.5)
WBC: 7.3 10*3/uL (ref 4.0–10.5)

## 2016-02-20 LAB — BASIC METABOLIC PANEL
Anion gap: 5 (ref 5–15)
BUN: 15 mg/dL (ref 6–20)
CHLORIDE: 108 mmol/L (ref 101–111)
CO2: 26 mmol/L (ref 22–32)
CREATININE: 1.56 mg/dL — AB (ref 0.61–1.24)
Calcium: 9.1 mg/dL (ref 8.9–10.3)
GFR, EST AFRICAN AMERICAN: 46 mL/min — AB (ref >60)
GFR, EST NON AFRICAN AMERICAN: 39 mL/min — AB (ref >60)
Glucose, Bld: 94 mg/dL (ref 65–99)
Potassium: 3.6 mmol/L (ref 3.5–5.1)
Sodium: 139 mmol/L (ref 135–145)

## 2016-02-20 LAB — ECHOCARDIOGRAM COMPLETE
Height: 71 in
WEIGHTICAEL: 2321 [oz_av]

## 2016-02-20 MED ORDER — POTASSIUM CHLORIDE CRYS ER 20 MEQ PO TBCR
20.0000 meq | EXTENDED_RELEASE_TABLET | ORAL | Status: AC
  Administered 2016-02-20 (×2): 20 meq via ORAL
  Filled 2016-02-20 (×2): qty 1

## 2016-02-20 NOTE — Progress Notes (Signed)
  Echocardiogram 2D Echocardiogram has been performed.  Paul Odonnell 02/20/2016, 12:47 PM

## 2016-02-20 NOTE — Progress Notes (Signed)
  Progress Note    02/20/2016 9:31 AM * No surgery found *  Subjective:  Pain improved  Vitals:   02/20/16 0730 02/20/16 0800  BP:  (!) 83/55  Pulse:  64  Resp:  19  Temp: 98.3 F (36.8 C)     Physical Exam: aaox3 Non labored respirations Non tender abdomen to deep palpation Palpable popliteal pulses bilaterally  CBC    Component Value Date/Time   WBC 7.3 02/20/2016 0215   RBC 3.49 (L) 02/20/2016 0215   HGB 11.2 (L) 02/20/2016 0215   HCT 33.1 (L) 02/20/2016 0215   PLT 186 02/20/2016 0215   MCV 94.8 02/20/2016 0215   MCH 32.1 02/20/2016 0215   MCHC 33.8 02/20/2016 0215   RDW 13.0 02/20/2016 0215   LYMPHSABS 1.8 02/20/2016 0215   MONOABS 0.4 02/20/2016 0215   EOSABS 0.0 02/20/2016 0215   BASOSABS 0.0 02/20/2016 0215    BMET    Component Value Date/Time   NA 139 02/20/2016 0215   K 3.6 02/20/2016 0215   CL 108 02/20/2016 0215   CO2 26 02/20/2016 0215   GLUCOSE 94 02/20/2016 0215   BUN 15 02/20/2016 0215   CREATININE 1.56 (H) 02/20/2016 0215   CALCIUM 9.1 02/20/2016 0215   GFRNONAA 39 (L) 02/20/2016 0215   GFRAA 46 (L) 02/20/2016 0215    INR    Component Value Date/Time   INR 1.07 10/25/2011 1508     Intake/Output Summary (Last 24 hours) at 02/20/16 0931 Last data filed at 02/20/16 0700  Gross per 24 hour  Intake           891.47 ml  Output              575 ml  Net           316.47 ml     Assessment:  83 y.o. male is here with abdominal pain and notable dissection of terminal aorta at level of known small aneurysm, pain and bp now controlled  Plan: Diet as tolerated Ok for activity from vascular standpoint Will plan to follow up in office in 4-6 weeks with AAA duplex given renal function and low suspicion of dissection causing further issues Needs to have better bp control as outpatient  Lynnann Knudsen C. Donzetta Matters, MD [facility] [place] Office: [phone] Pager: [phone]  02/20/2016 9:31 AM

## 2016-02-20 NOTE — Progress Notes (Signed)
Subjective: IV infiltrated. No abd/rt flank pain. No n/v. abd feels normal. No flatus. tmax of 101  Objective: Vital signs in last 24 hours: Temp:  [98.3 F (36.8 C)-101.3 F (38.5 C)] 98.3 F (36.8 C) (01/29 0730) Pulse Rate:  [58-100] 64 (01/29 0700) Resp:  [12-27] 17 (01/29 0700) BP: (77-205)/(46-136) 105/58 (01/29 0700) SpO2:  [91 %-100 %] 95 % (01/29 0700) Weight:  [65.8 kg (145 lb 1 oz)-68 kg (150 lb)] 65.8 kg (145 lb 1 oz) (01/29 0615) Last BM Date: 02/19/16  Intake/Output from previous day: 01/28 0701 - 01/29 0700 In: 891.5 [I.V.:791.5; IV Piggyback:100] Out: 575 [Urine:575] Intake/Output this shift: No intake/output data recorded.  Alert, sitting in chair, nad Soft, nt, nd. +BS. No RLQ/flank TTP  Lab Results:   Recent Labs  02/19/16 1929 02/20/16 0215  WBC 6.0 7.3  HGB 12.3* 11.2*  HCT 36.5* 33.1*  PLT 180 186   BMET  Recent Labs  02/19/16 1252 02/19/16 1929 02/20/16 0215  NA 141  --  139  K 3.7  --  3.6  CL 107  --  108  CO2 25  --  26  GLUCOSE 165*  --  94  BUN 17  --  15  CREATININE 1.44* 1.31* 1.56*  CALCIUM 9.3  --  9.1   PT/INR No results for input(s): LABPROT, INR in the last 72 hours. ABG No results for input(s): PHART, HCO3 in the last 72 hours.  Invalid input(s): PCO2, PO2  Studies/Results: Dg Chest 2 View  Result Date: 02/19/2016 CLINICAL DATA:  Chest pain. EXAM: CHEST  2 VIEW COMPARISON:  05/13/2014 FINDINGS: Cardiomediastinal silhouette is normal. Mediastinal contours appear intact. Tortuosity and calcific atherosclerotic disease of the aorta. There is no evidence of focal airspace consolidation, pleural effusion or pneumothorax. Osseous structures are without acute abnormality. Soft tissues are grossly normal. IMPRESSION: No active cardiopulmonary disease. Calcific atherosclerotic disease of the aorta and tortuosity. Electronically Signed   By: Fidela Salisbury M.D.   On: 02/19/2016 13:37   Ct Angio Chest/abd/pel For  Dissection W And/or W/wo  Result Date: 02/19/2016 CLINICAL DATA:  83 year old male with chest, abdominal and pelvic pain radiating to back for 1 day. EXAM: CT ANGIOGRAPHY CHEST, ABDOMEN AND PELVIS TECHNIQUE: Multidetector CT imaging through the chest, abdomen and pelvis was performed using the standard protocol during bolus administration of intravenous contrast. Multiplanar reconstructed images and MIPs were obtained and reviewed to evaluate the vascular anatomy. CONTRAST:  80 cc intravenous Isovue 370 COMPARISON:  02/19/2016 chest radiograph.  04/27/2014 CT. FINDINGS: CTA CHEST FINDINGS Cardiovascular: Preferential opacification of the thoracic aorta. There is no evidence of thoracic aortic aneurysm or dissection. Cardiomegaly noted. Coronary artery and thoracic aortic atherosclerotic calcifications are present. There is no evidence of pericardial effusion. Mediastinum/Nodes: No enlarged mediastinal, hilar, or axillary lymph nodes. Thyroid gland, trachea, and esophagus demonstrate no significant findings. Lungs/Pleura: Lungs are clear. No pleural effusion or pneumothorax. Musculoskeletal: No chest wall abnormality. No acute or significant osseous findings. Review of the MIP images confirms the above findings. CTA ABDOMEN AND PELVIS FINDINGS VASCULAR A small focal dissection (1 cm in length) involving the anterior aspect of the distal abdominal aorta is noted (images 215-218). The abdominal aorta measures 3.3 cm in greatest diameter at this level. There is no evidence of dissection extending into the common iliac arteries are branch vessels. No adjacent hematoma is identified. Aortic atherosclerotic calcifications are noted. The celiac artery, mesenteric arteries, renal arteries and iliac arteries are patent. It is difficult to  determine if this dissection was present on the prior study given prior contrast opacification. Review of the MIP images confirms the above findings. NON-VASCULAR Hepatobiliary: The liver  and gallbladder are unremarkable. There is no evidence of biliary dilatation. Pancreas: Unremarkable Spleen: Unremarkable Adrenals/Urinary Tract: Bilateral renal cortical thinning noted. The adrenal glands and bladder are unremarkable. Stomach/Bowel: The appendix is enlarged (10 mm in diameter) with mild periappendiceal stranding. There is no evidence of bowel obstruction or pneumoperitoneum. Lymphatic: No enlarged lymph nodes identified. Reproductive: Prostate is unremarkable. Other: No free fluid or focal collection. Musculoskeletal: No acute or significant osseous findings. Review of the MIP images confirms the above findings. IMPRESSION: Enlarged appendix with mild adjacent inflammation likely representing acute appendicitis. No evidence free fluid, abscess or pneumoperitoneum. Small focal dissection involving the anterior aspect of the distal abdominal aorta with abdominal aortic aneurysm measuring 3.3 cm at this level. No evidence of adjacent inflammation or hematoma. It is uncertain if this dissection was present on the prior study given prior contrast opacification of the aorta. No evidence of thoracic aortic aneurysm or dissection. Dr. Claretta Fraise notified these results at 5:10 p.m. on 02/19/2016. Electronically Signed   By: Margarette Canada M.D.   On: 02/19/2016 17:10    Anti-infectives: Anti-infectives    Start     Dose/Rate Route Frequency Ordered Stop   02/19/16 2100  piperacillin-tazobactam (ZOSYN) IVPB 3.375 g     3.375 g 12.5 mL/hr over 240 Minutes Intravenous Every 8 hours 02/19/16 2026        Assessment/Plan: Ao dissection/AAA - mgmt per primary service and vascular Dilated appendix - other than isolated Temp of 101 pt has no other clinical signs of appendicitis. Would continue empiric abx, cont diet as tolerated. Will see again in AM  Leighton Ruff. Redmond Pulling, MD, FACS General, Bariatric, & Minimally Invasive Surgery [facility], [place]   LOS: 1 day    Gayland Curry 02/20/2016

## 2016-02-21 ENCOUNTER — Inpatient Hospital Stay (HOSPITAL_COMMUNITY): Payer: Medicare Other

## 2016-02-21 DIAGNOSIS — M5440 Lumbago with sciatica, unspecified side: Secondary | ICD-10-CM

## 2016-02-21 DIAGNOSIS — I7102 Dissection of abdominal aorta: Secondary | ICD-10-CM

## 2016-02-21 MED ORDER — LABETALOL HCL 100 MG PO TABS
100.0000 mg | ORAL_TABLET | Freq: Two times a day (BID) | ORAL | Status: DC
  Administered 2016-02-21 – 2016-02-23 (×5): 100 mg via ORAL
  Filled 2016-02-21 (×5): qty 1

## 2016-02-21 MED ORDER — IOPAMIDOL (ISOVUE-300) INJECTION 61%: 15.0000 mL | INTRAVENOUS | Status: AC

## 2016-02-21 NOTE — Progress Notes (Signed)
[facility] Progress Note     Subjective: Still having abdominal pain around his umbilicus. No nausea, vomiting, flatus or BM. Wife at bedside. States pt is eating a little.   Objective: Vital signs in last 24 hours: Temp:  [98.2 F (36.8 C)-99.2 F (37.3 C)] 98.4 F (36.9 C) (01/30 0834) Pulse Rate:  [52-67] 55 (01/30 0800) Resp:  [12-26] 15 (01/30 0800) BP: (81-153)/(38-92) 111/57 (01/30 0700) SpO2:  [92 %-97 %] 95 % (01/30 0800) Weight:  [149 lb 14.6 oz (68 kg)] 149 lb 14.6 oz (68 kg) (01/30 0530) Last BM Date: 02/19/16  Intake/Output from previous day: 01/29 0701 - 01/30 0700 In: 1523.6 [P.O.:540; I.V.:883.6; IV Piggyback:100] Out: 1301 [Urine:1301] Intake/Output this shift: No intake/output data recorded.  PE: Gen:  Alert, NAD, pleasant, cooperative, lying in bed. Appears uncomfortable Card:  RRR, heart sounds sound normal Pulm:  Rate and effort normal Abd: Soft, nondistended, +BS, generalized TTP worse around umbilicus.  Skin: not diaphoretic, warm and dry  Lab Results:   Recent Labs  02/19/16 1929 02/20/16 0215  WBC 6.0 7.3  HGB 12.3* 11.2*  HCT 36.5* 33.1*  PLT 180 186   BMET  Recent Labs  02/19/16 1252 02/19/16 1929 02/20/16 0215  NA 141  --  139  K 3.7  --  3.6  CL 107  --  108  CO2 25  --  26  GLUCOSE 165*  --  94  BUN 17  --  15  CREATININE 1.44* 1.31* 1.56*  CALCIUM 9.3  --  9.1   PT/INR No results for input(s): LABPROT, INR in the last 72 hours. CMP     Component Value Date/Time   NA 139 02/20/2016 0215   K 3.6 02/20/2016 0215   CL 108 02/20/2016 0215   CO2 26 02/20/2016 0215   GLUCOSE 94 02/20/2016 0215   BUN 15 02/20/2016 0215   CREATININE 1.56 (H) 02/20/2016 0215   CALCIUM 9.1 02/20/2016 0215   PROT 6.8 02/19/2016 1252   ALBUMIN 3.9 02/19/2016 1252   AST 21 02/19/2016 1252   ALT 14 (L) 02/19/2016 1252   ALKPHOS 76 02/19/2016 1252   BILITOT 0.5 02/19/2016 1252   GFRNONAA 39 (L) 02/20/2016 0215   GFRAA 46 (L)  02/20/2016 0215   Lipase     Component Value Date/Time   LIPASE 24 02/19/2016 1252       Studies/Results: Dg Chest 2 View  Result Date: 02/19/2016 CLINICAL DATA:  Chest pain. EXAM: CHEST  2 VIEW COMPARISON:  05/13/2014 FINDINGS: Cardiomediastinal silhouette is normal. Mediastinal contours appear intact. Tortuosity and calcific atherosclerotic disease of the aorta. There is no evidence of focal airspace consolidation, pleural effusion or pneumothorax. Osseous structures are without acute abnormality. Soft tissues are grossly normal. IMPRESSION: No active cardiopulmonary disease. Calcific atherosclerotic disease of the aorta and tortuosity. Electronically Signed   By: Fidela Salisbury M.D.   On: 02/19/2016 13:37   Ct Angio Chest/abd/pel For Dissection W And/or W/wo  Result Date: 02/19/2016 CLINICAL DATA:  83 year old male with chest, abdominal and pelvic pain radiating to back for 1 day. EXAM: CT ANGIOGRAPHY CHEST, ABDOMEN AND PELVIS TECHNIQUE: Multidetector CT imaging through the chest, abdomen and pelvis was performed using the standard protocol during bolus administration of intravenous contrast. Multiplanar reconstructed images and MIPs were obtained and reviewed to evaluate the vascular anatomy. CONTRAST:  80 cc intravenous Isovue 370 COMPARISON:  02/19/2016 chest radiograph.  04/27/2014 CT. FINDINGS: CTA CHEST FINDINGS Cardiovascular: Preferential opacification of the thoracic aorta.  There is no evidence of thoracic aortic aneurysm or dissection. Cardiomegaly noted. Coronary artery and thoracic aortic atherosclerotic calcifications are present. There is no evidence of pericardial effusion. Mediastinum/Nodes: No enlarged mediastinal, hilar, or axillary lymph nodes. Thyroid gland, trachea, and esophagus demonstrate no significant findings. Lungs/Pleura: Lungs are clear. No pleural effusion or pneumothorax. Musculoskeletal: No chest wall abnormality. No acute or significant osseous findings.  Review of the MIP images confirms the above findings. CTA ABDOMEN AND PELVIS FINDINGS VASCULAR A small focal dissection (1 cm in length) involving the anterior aspect of the distal abdominal aorta is noted (images 215-218). The abdominal aorta measures 3.3 cm in greatest diameter at this level. There is no evidence of dissection extending into the common iliac arteries are branch vessels. No adjacent hematoma is identified. Aortic atherosclerotic calcifications are noted. The celiac artery, mesenteric arteries, renal arteries and iliac arteries are patent. It is difficult to determine if this dissection was present on the prior study given prior contrast opacification. Review of the MIP images confirms the above findings. NON-VASCULAR Hepatobiliary: The liver and gallbladder are unremarkable. There is no evidence of biliary dilatation. Pancreas: Unremarkable Spleen: Unremarkable Adrenals/Urinary Tract: Bilateral renal cortical thinning noted. The adrenal glands and bladder are unremarkable. Stomach/Bowel: The appendix is enlarged (10 mm in diameter) with mild periappendiceal stranding. There is no evidence of bowel obstruction or pneumoperitoneum. Lymphatic: No enlarged lymph nodes identified. Reproductive: Prostate is unremarkable. Other: No free fluid or focal collection. Musculoskeletal: No acute or significant osseous findings. Review of the MIP images confirms the above findings. IMPRESSION: Enlarged appendix with mild adjacent inflammation likely representing acute appendicitis. No evidence free fluid, abscess or pneumoperitoneum. Small focal dissection involving the anterior aspect of the distal abdominal aorta with abdominal aortic aneurysm measuring 3.3 cm at this level. No evidence of adjacent inflammation or hematoma. It is uncertain if this dissection was present on the prior study given prior contrast opacification of the aorta. No evidence of thoracic aortic aneurysm or dissection. Dr. Claretta Fraise  notified these results at 5:10 p.m. on 02/19/2016. Electronically Signed   By: Margarette Canada M.D.   On: 02/19/2016 17:10    Anti-infectives: Anti-infectives    Start     Dose/Rate Route Frequency Ordered Stop   02/19/16 2100  piperacillin-tazobactam (ZOSYN) IVPB 3.375 g     3.375 g 12.5 mL/hr over 240 Minutes Intravenous Every 8 hours 02/19/16 2026         Assessment/Plan  Ao dissection/AAA - mgmt per primary service and vascular Dilated appendix - other than isolated Temp of 101 pt has no other clinical signs of appendicitis. Would continue empiric abx. Worsening abdominal tenderness today. Will get repeat CT with oral contrast only to reevaluated the appendix.   We will continue to follow.     LOS: 2 days    Kalman Drape , [facility] 02/21/2016, 9:12 AM Pager: [phone] Consults: [phone] Mon-Fri 7:00 am-4:30 pm Sat-Sun 7:00 am-11:30 am

## 2016-02-21 NOTE — Care Management Note (Signed)
Case Management Note  Patient Details  Name: Paul Odonnell MRN: [id number] Date of Birth: [birth year]  Subjective/Objective:   Lives with wife, has cane, walker, wheelchair, and BSC.                               Expected Discharge Plan:  [place]  Discharge planning Services  CM Consult   Status of Service:  In process, will continue to follow  Girard Cooter, RN 02/21/2016, 3:05 PM

## 2016-02-21 NOTE — Progress Notes (Signed)
PULMONARY / CRITICAL CARE MEDICINE   Name: Paul Odonnell MRN: [id number] DOB: [birth year]    ADMISSION DATE:  02/19/2016  CHIEF COMPLAINT:  Abdominal pain  HISTORY OF PRESENT ILLNESS:   Paul Odonnell is an 83 y/o man with a history of a AAA, CAD s/p NSTEMI with DES to LAD in March 2009, T2DM, HTN, and HLD who presented to the ED on 1/28 with complaint of abdominal pain. Patient states that on 1/27 at 1700, he developed abdominal pain after eating. He describes the pain as sharp and severe extending from inferior of his umbilicus superiorly to his chest and posteriorly to his back. At its worst, pain is a 10/10, currently it is a 7/10 after pain medication. He denies any associated symptoms including fever, chills, chest pain, shortness of breath, cough, nausea, vomiting, constipation, headache or lightheadedness. He does admit to loose stools over the past 2 days, but denies any today. Due to the lack of improvement in pain, patient and his wife came to the ED today. Of note, patient's BP medications were (Bystolic) was stopped in July 2017 due to low BP.   In the ED, patient was afebrile, hypertensive to 165/79, HR 78, RR 17 and satting well on room air. BMET shows creatinine of 1.44. CBC is without leukocytosis, hgb 11.9. EKG shows a RBBB, NSR. CTA chest/abd showed an enlarged appendix with mild adjacent inflammation likely representing acute appendicitis. However, given his lack of fever, leukocytosis, RLQ pain - this was felt to be incidental. However, surgery is following and patient has been started on Zosyn. Follow clinicallys. More concerning was a small focal dissection involving the anterior aspect of the distal abdominal aorta with abdominal aortic aneurysm measuring 3.3 cm at this level. No evidence of adjacent inflammation or hematoma. Patient was started on Cardizem gtt and Vascular surgery was consulted.     HEMODYNAMICS:    VENTILATOR SETTINGS:    INTAKE / OUTPUT: I/O last 3  completed shifts: In: 2415.1 [P.O.:540; I.V.:1675.1; IV Piggyback:200] Out: 1876 [Urine:1876]  PHYSICAL EXAMINATION: Vitals:   02/21/16 0615 02/21/16 0630 02/21/16 0645 02/21/16 0700  BP: (!) 114/56 (!) 117/53 (!) 101/47 (!) 111/57  Pulse: (!) 57 (!) 52 (!) 54 (!) 53  Resp: 16 18 18 12   Temp:      TempSrc:      SpO2: 95% 95% 93% 95%  Weight:      Height:       General: Vital signs reviewed.  Patient is elderly, in no acute distress and cooperative with exam.  Obvious tremor HEENT: Atraumatic, PERRLA, moist mucous membranes. No JVD. No carotid bruits.  Cardiovascular: Bradycardia 56, regular rhythm, 2/6 SEM. Pulmonary/Chest: Clear to auscultation bilaterally, no wheezes, rales, or rhonchi. Abdominal: Soft, tender to palpation in epigastric and umbilical regions, non-distended, BS +, no guarding present. No RLQ tenderness.  Extremities: No lower extremity edema bilaterally, pulses symmetric and intact bilaterally.  Neurological: A&O x3, answers questions appropriately Skin: Warm, dry and intact. No rashes or erythema. Psychiatric: Normal mood and affect.   LABS:  BMET  Recent Labs Lab 02/19/16 1252 02/19/16 1929 02/20/16 0215  NA 141  --  139  K 3.7  --  3.6  CL 107  --  108  CO2 25  --  26  BUN 17  --  15  CREATININE 1.44* 1.31* 1.56*  GLUCOSE 165*  --  94    Electrolytes  Recent Labs Lab 02/19/16 1252 02/19/16 1929 02/20/16 0215  CALCIUM 9.3  --  9.1  MG  --  2.0  --   PHOS  --  2.5  --     CBC  Recent Labs Lab 02/19/16 1252 02/19/16 1929 02/20/16 0215  WBC 5.4 6.0 7.3  HGB 11.9* 12.3* 11.2*  HCT 35.5* 36.5* 33.1*  PLT 197 180 186    Coag's No results for input(s): APTT, INR in the last 168 hours.  Sepsis Markers No results for input(s): LATICACIDVEN, PROCALCITON, O2SATVEN in the last 168 hours.  ABG No results for input(s): PHART, PCO2ART, PO2ART in the last 168 hours.  Liver Enzymes  Recent Labs Lab 02/19/16 1252  AST 21  ALT 14*   ALKPHOS 76  BILITOT 0.5  ALBUMIN 3.9    Cardiac Enzymes No results for input(s): TROPONINI, PROBNP in the last 168 hours.  Glucose No results for input(s): GLUCAP in the last 168 hours.  Imaging No results found.  STUDIES:  CTA Chest/Abd 1/28 >> Enlarged appendix with mild adjacent inflammation likely representing acute appendicitis. No evidence free fluid, abscess or pneumoperitoneum. Small focal dissection involving the anterior aspect of the distal abdominal aorta with abdominal aortic aneurysm measuring 3.3 cm at this level. No evidence of adjacent inflammation or hematoma. It is uncertain if this dissection was present on the prior study given prior contrast opacification of the aorta. No evidence of thoracic aortic aneurysm or dissection.  CULTURES: None  ANTIBIOTICS: Zosyn 1/28 >>  SIGNIFICANT EVENTS: 1/28  >> admitted for AAA  LINES/TUBES:   DISCUSSION: Paul Odonnell is an 83 y/o man with a history of a AAA, CAD s/p NSTEMI with DES to LAD in March 2009, T2DM, HTN, and HLD who presented to the ED on 1/28 with complaint of abdominal pain and found to have a small focal AAA dissection. Also, CT showed possible appendicitis.  ASSESSMENT / PLAN:  PULMONARY A: No acute issues P:   Monitor  CARDIOVASCULAR A:  Small Focal AAA Dissection CAD s/p NSTEMI with DES to LAD in 2009 HTN  HLD P:  Esmolol gtt (Maintain SBP 100-110 and HR <60) Add Nicardipine gtt if not at goal Morphine 2 mg Q2H prn Vascular Surgery following, not surgical candidate at this time Consider po antihypertensive and dc IV drip Vascular to follow as outpatient    RENAL Lab Results  Component Value Date   CREATININE 1.56 (H) 02/20/2016   CREATININE 1.31 (H) 02/19/2016   CREATININE 1.44 (H) 02/19/2016    A:   CKD III P:   Monitor with BMET  GASTROINTESTINAL A:   Recent Loose Stools P:   Monitor Heart Healthy Diet Colace 100 mg BID prn   HEMATOLOGIC  Recent Labs   02/19/16 1929 02/20/16 0215  HGB 12.3* 11.2*    A:   Chronic Normocytic Anemia P:  Monitor on CBC Heparin TID SQ  INFECTIOUS A:   Possible Appendicitis: Seen on CT Scan, but does not fit clinically P:   On Zosyn 1/28>>consider dc Monitor fever curve and WBC General Surgery following  ENDOCRINE  A:   T2DM controlled without medication Hypothyroidism P:   Follow glucose on labs Levothyroxine 50 mcg QD  NEUROLOGIC A:   Depression and Anxiety P:   Continue home Lexapro 10 mg QAM and Klonopin 0.5 mg QHS Gabapentin 100 mg BID  FAMILY  - Updates: No family at bedside. Patient is a DNR - Inter-disciplinary family meet or Palliative Care meeting due by:  02/25/16     Richardson Landry Minor ACNP Maryanna Shape PCCM Pager [phone] till 3  pm If no answer page [phone] 02/21/2016, 8:34 AM   STAFF NOTE: I, Merrie Roof, MD FACP have personally reviewed patient's available data, including medical history, events of note, physical examination and test results as part of my evaluation. I have discussed with resident/NP and other care providers such as pharmacist, RN and RRT. In addition, I personally evaluated patient and elicited key findings of: awake, alert, mid lower abdo pain, lungs clear, all CT reviewed, has tenderness mid lower abdomen, no r/g, I have concerns that abdo pain is NOT abdomial dissection related and appendix is a concern, for CT repeat with contrast assessment, mintain zosyn for now also until diagnosis  Noted for abdo pain, esmolol drip titration to off as goal then add oral labetolol to maintain BP control at goals 110 sys remain, I have updated wife and pt in room The patient is critically ill with multiple organ systems failure and requires high complexity decision making for assessment and support, frequent evaluation and titration of therapies, application of advanced monitoring technologies and extensive interpretation of multiple databases.   Critical Care Time  devoted to patient care services described in this note is 30 Minutes. This time reflects time of care of this signee: Merrie Roof, MD FACP. This critical care time does not reflect procedure time, or teaching time or supervisory time of PA/NP/Med student/Med Resident etc but could involve care discussion time. Rest per NP/medical resident whose note is outlined above and that I agree with   Lavon Paganini. Titus Mould, MD, [place] Pgr: [facility] 02/21/2016 10:57 AM

## 2016-02-22 DIAGNOSIS — E1121 Type 2 diabetes mellitus with diabetic nephropathy: Secondary | ICD-10-CM

## 2016-02-22 DIAGNOSIS — I10 Essential (primary) hypertension: Secondary | ICD-10-CM

## 2016-02-22 LAB — GLUCOSE, CAPILLARY
GLUCOSE-CAPILLARY: 96 mg/dL (ref 65–99)
Glucose-Capillary: 111 mg/dL — ABNORMAL HIGH (ref 65–99)

## 2016-02-22 MED ORDER — PANTOPRAZOLE SODIUM 40 MG PO TBEC
40.0000 mg | DELAYED_RELEASE_TABLET | Freq: Every day | ORAL | Status: DC
  Administered 2016-02-22 – 2016-02-23 (×2): 40 mg via ORAL
  Filled 2016-02-22 (×2): qty 1

## 2016-02-22 MED ORDER — OXYCODONE HCL ER 10 MG PO T12A
20.0000 mg | EXTENDED_RELEASE_TABLET | Freq: Two times a day (BID) | ORAL | Status: DC
  Administered 2016-02-22 – 2016-02-23 (×2): 20 mg via ORAL
  Filled 2016-02-22 (×2): qty 2

## 2016-02-22 MED ORDER — INSULIN ASPART 100 UNIT/ML ~~LOC~~ SOLN: 0.0000 [IU] | Freq: Three times a day (TID) | SUBCUTANEOUS | Status: DC

## 2016-02-22 NOTE — Progress Notes (Signed)
[facility] Progress Note     Subjective: Pt feeling better. He is more alert without complaints of abdominal pain. He had reg diet last night and tolerated it well. No complaints today and no events overnight. Wife at bedside  Objective: Vital signs in last 24 hours: Temp:  [98.4 F (36.9 C)-99 F (37.2 C)] 98.6 F (37 C) (01/31 0500) Pulse Rate:  [54-70] 67 (01/31 0500) Resp:  [14-19] 19 (01/30 1953) BP: (103-159)/(52-75) 140/61 (01/31 0500) SpO2:  [94 %-98 %] 98 % (01/30 1800) Last BM Date: 02/19/16  Intake/Output from previous day: 01/30 0701 - 01/31 0700 In: -  Out: 1700 [Urine:1700] Intake/Output this shift: No intake/output data recorded.  PE: Gen:  Alert, NAD, pleasant, cooperative, sitting up in bed, well appearing  Card:  RRR, heart sounds sound normal Pulm:  Rate and effort normal Abd: Soft, nondistended, +BS, no TTP  Skin: not diaphoretic, warm and dry  Lab Results:   Recent Labs  02/19/16 1929 02/20/16 0215  WBC 6.0 7.3  HGB 12.3* 11.2*  HCT 36.5* 33.1*  PLT 180 186   BMET  Recent Labs  02/19/16 1252 02/19/16 1929 02/20/16 0215  NA 141  --  139  K 3.7  --  3.6  CL 107  --  108  CO2 25  --  26  GLUCOSE 165*  --  94  BUN 17  --  15  CREATININE 1.44* 1.31* 1.56*  CALCIUM 9.3  --  9.1   PT/INR No results for input(s): LABPROT, INR in the last 72 hours. CMP     Component Value Date/Time   NA 139 02/20/2016 0215   K 3.6 02/20/2016 0215   CL 108 02/20/2016 0215   CO2 26 02/20/2016 0215   GLUCOSE 94 02/20/2016 0215   BUN 15 02/20/2016 0215   CREATININE 1.56 (H) 02/20/2016 0215   CALCIUM 9.1 02/20/2016 0215   PROT 6.8 02/19/2016 1252   ALBUMIN 3.9 02/19/2016 1252   AST 21 02/19/2016 1252   ALT 14 (L) 02/19/2016 1252   ALKPHOS 76 02/19/2016 1252   BILITOT 0.5 02/19/2016 1252   GFRNONAA 39 (L) 02/20/2016 0215   GFRAA 46 (L) 02/20/2016 0215   Lipase     Component Value Date/Time   LIPASE 24 02/19/2016 1252        Studies/Results: Ct Abdomen Pelvis Wo Contrast  Result Date: 02/21/2016 CLINICAL DATA:  Right lower quadrant abdominal pain. EXAM: CT ABDOMEN AND PELVIS WITHOUT CONTRAST TECHNIQUE: Multidetector CT imaging of the abdomen and pelvis was performed following the standard protocol without IV contrast. COMPARISON:  02/19/2016. FINDINGS: Lower chest: No acute findings. Small lateral right middle lobe nodule. Mild dependent lung base atelectasis. Dense coronary artery calcifications. Hepatobiliary: Liver is unremarkable. Gallbladder is distended. No wall thickening or adjacent inflammation. Common bile duct is prominent measuring 9 mm. There is distal tapering. Pancreas: Pancreatic atrophy. There is relative low attenuation in the pancreatic head. Subtle haziness is seen in the fat adjacent to the pancreatic head and first and second portions of the duodenum. Pancreas otherwise unremarkable. Spleen: Normal in size without focal abnormality. Adrenals/Urinary Tract: No adrenal masses. No renal masses or stones. No hydronephrosis. Normal ureters. Bladder is unremarkable. Stomach/Bowel: The appendix is now dilated to 11 mm. However, the wall is thin. It contains nondependent air and contrast. There is no adjacent inflammation. Stomach is unremarkable. There is no bowel dilation to suggest obstruction. There is no wall thickening or mesenteric inflammation. Air-fluid levels are noted in  the small bowel and colon, nonspecific, but suggesting a mild diffuse adynamic ileus. Vascular/Lymphatic: 3 cm infrarenal abdominal aortic aneurysm. No evidence of rupture. Atherosclerotic calcifications noted along the aorta and iliac vessels and other aortic branch vessels. No pathologically enlarged lymph nodes. Reproductive: Unremarkable. Other: No abdominal wall hernia. No ascites. Increased attenuation in the retroareolar right breast is consistent with gynecomastia, stable. Musculoskeletal: Right ight hip the left LL is  consistent with an old, healed fracture. No acute fracture. No osteoblastic or osteolytic lesions. IMPRESSION: 1. Since the prior exam the appendix has become more dilated, now measuring 11 mm in greatest diameter. However, contrast is seen within much of the appendix. The appendix wall is thin and there is nondependent air within the appendix and no significant adjacent inflammation. Latter findings would strongly argue against acute appendicitis. 2. Subtle inflammatory changes are seen adjacent to the pancreatic head and duodenum. This is nonspecific. Consider duodenitis or mild focal pancreatitis. The gallbladder is also distended. The possibility of early acute cholecystitis should be considered if this correlates clinically. This could be further assessed with limited right upper quadrant ultrasound. 3. Air-fluid levels noted in the colon small bowel suggesting a mild diffuse adynamic ileus. No bowel obstruction or bowel inflammation. 4. Infrarenal abdominal aortic aneurysm.  No evidence of rupture. Electronically Signed   By: Lajean Manes M.D.   On: 02/21/2016 13:16    Anti-infectives: Anti-infectives    Start     Dose/Rate Route Frequency Ordered Stop   02/19/16 2100  piperacillin-tazobactam (ZOSYN) IVPB 3.375 g  Status:  Discontinued     3.375 g 12.5 mL/hr over 240 Minutes Intravenous Every 8 hours 02/19/16 2026 02/21/16 1433       Assessment/Plan  Ao dissection/AAA - mgmt per primary service and vascular Dilated appendix, abdominal pain - repeat CT scan yesterday showed no appendix wall thickening, no periappendiceal inflammation and the appendix fills with contrast. This does not look like appendicitis.  - he is tolerating a reg diet and is not having abdominal pain  - DC antibiotics as he is afebrile and no WBC - wife is asking for diet recommendations at discharge. He is tolerating a regular diet here in the hospital so from a surgical standpoint he can resume his regular home  diet.  No acute surgical needs. Pt is feeling better without abdominal pain. Surgery will sign off. Please page [place] with any questions or concerns.   LOS: 3 days    Kalman Drape , [facility] 02/22/2016, 8:10 AM Pager: [phone] Consults: [phone] Mon-Fri 7:00 am-4:30 pm Sat-Sun 7:00 am-11:30 am

## 2016-02-22 NOTE — Progress Notes (Signed)
PROGRESS NOTE    Paul Odonnell  [id number] DOB: [birth year] DOA: 02/19/2016 PCP: Tommy Medal, MD    Brief Narrative:  Admitted with abdominal pain, severe, and persistent. CT with 3.3 cm abdominal aneurysm, and possible appendicitis. Admitted to the ICU for blood pressure control with IV antihypertensive agents. Surgical consultation. Patient on short course of antibiotics. Transferred to medical telemetry on stable condition. Needs physical therapy evaluation in preparation for discharge.   Assessment & Plan:   Active Problems:   Aortic dissection (HCC)   Dissection of abdominal aorta (HCC)   Acute right-sided low back pain with sciatica   1. Hypertensive urgency with abdominal aortic aneurysm. Will continue blood pressure control with labetalol 100 mg bid, blood pressure [id number] to [id number] systolic. Clinically euvolemic.   2. Suspected appendicitis. No abdominal pain, patient off antibiotic therapy, will continue to advance diet as tolerated.   3. CAD. No chest pain, continue statin therapy and antiplatelet therapy.   4. CKD stage 3. Renal function with cr at 1.56 from 1,31. Will continue blood pressure control, follow renal function in am, avoid hypotension or nephrotoxic medications.   5. T2DM. Capillary glucose 140 to 150's, patient tolerating po well, will add insulin sliding scale for glucose monitor and coverage. At home not on hypoglycemic agents.  6. Hypothyroid. Continue levothyroxine.   7. Anxiety/ depression. Continue clonazepam and escitalopram.   Follow on physical therapy evaluation, out of bed, in preparation for discharge.     DVT prophylaxis: heparin  Code Status: dnr  Family Communication: I spoke with patient's wife at the bedside and all questions were addressed.  Disposition Plan: home   Consultants:   Surgery   Procedures:   Antimicrobials:   Subjective: Patient with no chest pain or dyspnea, had nausea last night, no fever or chills. Out of bed  with assistance.   Objective: Vitals:   02/21/16 1953 02/21/16 2056 02/21/16 2229 02/22/16 0500  BP: 134/65  (!) 159/75 140/61  Pulse:   70 67  Resp: 19     Temp:  99 F (37.2 C)  98.6 F (37 C)  TempSrc:  Oral  Oral  SpO2:      Weight:      Height:        Intake/Output Summary (Last 24 hours) at 02/22/16 0837 Last data filed at 02/22/16 0300  Gross per 24 hour  Intake                0 ml  Output             1400 ml  Net            -1400 ml   Filed Weights   02/19/16 2030 02/20/16 0615 02/21/16 0530  Weight: 65.8 kg (145 lb 1 oz) 65.8 kg (145 lb 1 oz) 68 kg (149 lb 14.6 oz)    Examination:  General exam: no in pain or dyspnea Head, normocephalic, nose and ears with no deformities.   Respiratory system: Clear to auscultation. Respiratory effort normal. Mild decrease breath sounds at bases.  Cardiovascular system: S1 & S2 heard, RRR. No JVD, murmurs, rubs, gallops or clicks. No pedal edema. Gastrointestinal system: Abdomen is nondistended, soft and nontender. No organomegaly or masses felt. Normal bowel sounds heard. Central nervous system: Alert and oriented. No focal neurological deficits. Extremities: Symmetric 5 x 5 power. Skin: No rashes, lesions or ulcers     Data Reviewed: I have personally reviewed following labs and imaging studies  CBC:  Recent  Labs Lab 02/19/16 1252 02/19/16 1929 02/20/16 0215  WBC 5.4 6.0 7.3  NEUTROABS  --   --  5.0  HGB 11.9* 12.3* 11.2*  HCT 35.5* 36.5* 33.1*  MCV 95.9 95.1 94.8  PLT 197 180 [id number]   Basic Metabolic Panel:  Recent Labs Lab 02/19/16 1252 02/19/16 1929 02/20/16 0215  NA 141  --  139  K 3.7  --  3.6  CL 107  --  108  CO2 25  --  26  GLUCOSE 165*  --  94  BUN 17  --  15  CREATININE 1.44* 1.31* 1.56*  CALCIUM 9.3  --  9.1  MG  --  2.0  --   PHOS  --  2.5  --    GFR: Estimated Creatinine Clearance: 34.5 mL/min (by C-G formula based on SCr of 1.56 mg/dL (H)). Liver Function Tests:  Recent Labs Lab  02/19/16 1252  AST 21  ALT 14*  ALKPHOS 76  BILITOT 0.5  PROT 6.8  ALBUMIN 3.9    Recent Labs Lab 02/19/16 1252  LIPASE 24   No results for input(s): AMMONIA in the last 168 hours. Coagulation Profile: No results for input(s): INR, PROTIME in the last 168 hours. Cardiac Enzymes: No results for input(s): CKTOTAL, CKMB, CKMBINDEX, TROPONINI in the last 168 hours. BNP (last 3 results) No results for input(s): PROBNP in the last 8760 hours. HbA1C: No results for input(s): HGBA1C in the last 72 hours. CBG: No results for input(s): GLUCAP in the last 168 hours. Lipid Profile: No results for input(s): CHOL, HDL, LDLCALC, TRIG, CHOLHDL, LDLDIRECT in the last 72 hours. Thyroid Function Tests: No results for input(s): TSH, T4TOTAL, FREET4, T3FREE, THYROIDAB in the last 72 hours. Anemia Panel: No results for input(s): VITAMINB12, FOLATE, FERRITIN, TIBC, IRON, RETICCTPCT in the last 72 hours. Sepsis Labs: No results for input(s): PROCALCITON, LATICACIDVEN in the last 168 hours.  Recent Results (from the past 240 hour(s))  MRSA PCR Screening     Status: None   Collection Time: 02/19/16  8:38 PM  Result Value Ref Range Status   MRSA by PCR NEGATIVE NEGATIVE Final    Comment:        The GeneXpert MRSA Assay (FDA approved for NASAL specimens only), is one component of a comprehensive MRSA colonization surveillance program. It is not intended to diagnose MRSA infection nor to guide or monitor treatment for MRSA infections.          Radiology Studies: Ct Abdomen Pelvis Wo Contrast  Result Date: 02/21/2016 CLINICAL DATA:  Right lower quadrant abdominal pain. EXAM: CT ABDOMEN AND PELVIS WITHOUT CONTRAST TECHNIQUE: Multidetector CT imaging of the abdomen and pelvis was performed following the standard protocol without IV contrast. COMPARISON:  02/19/2016. FINDINGS: Lower chest: No acute findings. Small lateral right middle lobe nodule. Mild dependent lung base atelectasis.  Dense coronary artery calcifications. Hepatobiliary: Liver is unremarkable. Gallbladder is distended. No wall thickening or adjacent inflammation. Common bile duct is prominent measuring 9 mm. There is distal tapering. Pancreas: Pancreatic atrophy. There is relative low attenuation in the pancreatic head. Subtle haziness is seen in the fat adjacent to the pancreatic head and first and second portions of the duodenum. Pancreas otherwise unremarkable. Spleen: Normal in size without focal abnormality. Adrenals/Urinary Tract: No adrenal masses. No renal masses or stones. No hydronephrosis. Normal ureters. Bladder is unremarkable. Stomach/Bowel: The appendix is now dilated to 11 mm. However, the wall is thin. It contains nondependent air and contrast. There is no adjacent  inflammation. Stomach is unremarkable. There is no bowel dilation to suggest obstruction. There is no wall thickening or mesenteric inflammation. Air-fluid levels are noted in the small bowel and colon, nonspecific, but suggesting a mild diffuse adynamic ileus. Vascular/Lymphatic: 3 cm infrarenal abdominal aortic aneurysm. No evidence of rupture. Atherosclerotic calcifications noted along the aorta and iliac vessels and other aortic branch vessels. No pathologically enlarged lymph nodes. Reproductive: Unremarkable. Other: No abdominal wall hernia. No ascites. Increased attenuation in the retroareolar right breast is consistent with gynecomastia, stable. Musculoskeletal: Right ight hip the left LL is consistent with an old, healed fracture. No acute fracture. No osteoblastic or osteolytic lesions. IMPRESSION: 1. Since the prior exam the appendix has become more dilated, now measuring 11 mm in greatest diameter. However, contrast is seen within much of the appendix. The appendix wall is thin and there is nondependent air within the appendix and no significant adjacent inflammation. Latter findings would strongly argue against acute appendicitis. 2. Subtle  inflammatory changes are seen adjacent to the pancreatic head and duodenum. This is nonspecific. Consider duodenitis or mild focal pancreatitis. The gallbladder is also distended. The possibility of early acute cholecystitis should be considered if this correlates clinically. This could be further assessed with limited right upper quadrant ultrasound. 3. Air-fluid levels noted in the colon small bowel suggesting a mild diffuse adynamic ileus. No bowel obstruction or bowel inflammation. 4. Infrarenal abdominal aortic aneurysm.  No evidence of rupture. Electronically Signed   By: Lajean Manes M.D.   On: 02/21/2016 13:16        Scheduled Meds: . aspirin EC  81 mg Oral Daily  . atorvastatin  20 mg Oral q1800  . clonazePAM  0.5 mg Oral QHS  . escitalopram  10 mg Oral Daily  . gabapentin  100 mg Oral BID  . heparin  5,000 Units Subcutaneous Q8H  . labetalol  100 mg Oral BID  . levothyroxine  50 mcg Oral QAC breakfast   Continuous Infusions: . esmolol Stopped (02/21/16 1521)     LOS: 3 days        Mauricio Gerome Apley, MD [facility] Pager [phone]  If 7PM-7AM, please contact night-coverage [email or web address] Password [facility] 02/22/2016, 8:37 AM

## 2016-02-23 LAB — GLUCOSE, CAPILLARY: GLUCOSE-CAPILLARY: 91 mg/dL (ref 65–99)

## 2016-02-23 LAB — CBC WITH DIFFERENTIAL/PLATELET
BASOS ABS: 0 10*3/uL (ref 0.0–0.1)
BASOS PCT: 0 %
EOS ABS: 0.1 10*3/uL (ref 0.0–0.7)
Eosinophils Relative: 2 %
HCT: 30.9 % — ABNORMAL LOW (ref 39.0–52.0)
HEMOGLOBIN: 10.5 g/dL — AB (ref 13.0–17.0)
Lymphocytes Relative: 36 %
Lymphs Abs: 1.9 10*3/uL (ref 0.7–4.0)
MCH: 32.2 pg (ref 26.0–34.0)
MCHC: 34 g/dL (ref 30.0–36.0)
MCV: 94.8 fL (ref 78.0–100.0)
Monocytes Absolute: 0.4 10*3/uL (ref 0.1–1.0)
Monocytes Relative: 8 %
NEUTROS PCT: 54 %
Neutro Abs: 2.8 10*3/uL (ref 1.7–7.7)
Platelets: 163 10*3/uL (ref 150–400)
RBC: 3.26 MIL/uL — AB (ref 4.22–5.81)
RDW: 13.2 % (ref 11.5–15.5)
WBC: 5.2 10*3/uL (ref 4.0–10.5)

## 2016-02-23 LAB — BASIC METABOLIC PANEL
Anion gap: 11 (ref 5–15)
BUN: 16 mg/dL (ref 6–20)
CHLORIDE: 104 mmol/L (ref 101–111)
CO2: 26 mmol/L (ref 22–32)
CREATININE: 1.59 mg/dL — AB (ref 0.61–1.24)
Calcium: 9.3 mg/dL (ref 8.9–10.3)
GFR calc non Af Amer: 38 mL/min — ABNORMAL LOW (ref >60)
GFR, EST AFRICAN AMERICAN: 45 mL/min — AB (ref >60)
Glucose, Bld: 89 mg/dL (ref 65–99)
POTASSIUM: 4.2 mmol/L (ref 3.5–5.1)
SODIUM: 141 mmol/L (ref 135–145)

## 2016-02-23 MED ORDER — LABETALOL HCL 100 MG PO TABS: 100.0000 mg | ORAL_TABLET | Freq: Two times a day (BID) | ORAL | 0 refills | Status: DC

## 2016-02-23 NOTE — Progress Notes (Signed)
Pt d/c home in no acute distress.  All dc paperwork and Rx for Labetolol given to wife who is at the bedside.  All questions answered.  Declining HH, CM notified me earlier today.

## 2016-02-23 NOTE — Care Management Note (Signed)
Case Management Note  Patient Details  Name: Kaidon Castellano MRN: [id number] Date of Birth: 04-27-1930  Subjective/Objective:   Pt presented for abdominal pain-found to have a small focal AAA dissection. Pt is from home with wife. Pt has DME-Cane, RW, WC and BSC.                  Action/Plan: CM did speak with pt in regards to discharge home. Pt is declining HH Services at this time. Pt's wife in room at time of conversation. CM did make pt and wife aware that if they need services to contact PCP for orders. CM did provide pt with the personal care list if needed. Staff RN aware that pt is declining [facility]. No further needs from CM at this time.   Expected Discharge Date:  02/23/16               Expected Discharge Plan:  [place]  In-House Referral:  NA  Discharge planning Services  CM Consult  Post Acute Care Choice:  NA Choice offered to:  NA  DME Arranged:  N/A DME Agency:  NA  HH Arranged:  NA HH Agency:  NA  Status of Service:  Completed, signed off  If discussed at [place] of Stay Meetings, dates discussed:    Additional Comments:  Bethena Roys, RN 02/23/2016, 11:02 AM

## 2016-02-23 NOTE — Evaluation (Signed)
Physical Therapy Evaluation Patient Details Name: Paul Odonnell MRN: [id number] DOB: 08/28/1930 Today's Date: 02/23/2016   History of Present Illness  Patient is a 83 y/o male with hx of prostate ca, MI, HTN, DM, depression, CAD presents with abdominal pain. Abdominal CT-3.3 cm abdominal aneurysm, and possible appendicitis  Clinical Impression  Patient presents with generalized weakness, deconditioning, and impaired mobility s/p above. Tolerated gait training with min A for balance/stability due to staggering and imbalance. Encouraged use of RW at home. Wife reports she can provide 24/7 supervision. Encouraged mobility while in hospital. Will follow acutely to maximize independence and mobility prior to return home.     Follow Up Recommendations Home health PT;Supervision for mobility/OOB    Equipment Recommendations  None recommended by PT    Recommendations for Other Services       Precautions / Restrictions Precautions Precautions: Fall Restrictions Weight Bearing Restrictions: No      Mobility  Bed Mobility Overal bed mobility: Modified Independent             General bed mobility comments: No assist needed, HOB elevated.   Transfers Overall transfer level: Needs assistance Equipment used: None Transfers: Sit to/from Stand Sit to Stand: Supervision         General transfer comment: Supervision for safety. Stood from [employer], transferred to chair post ambulation.  Ambulation/Gait Ambulation/Gait assistance: Min guard;Min assist Ambulation Distance (Feet): 150 Feet Assistive device: None Gait Pattern/deviations: Step-through pattern;Decreased stride length;Decreased step length - right;Decreased step length - left;Scissoring;Staggering right;Staggering left;Drifts right/left Gait velocity: decreased   General Gait Details: Slow, unsteady gait with scissoring pattern at times and staggering to right/left, reaching for rail for support. Encouraged use of RW at  home.   Stairs            Wheelchair Mobility    Modified Rankin (Stroke Patients Only)       Balance Overall balance assessment: Needs assistance;History of Falls Sitting-balance support: Feet supported;No upper extremity supported Sitting balance-Leahy Scale: Good Sitting balance - Comments: Able to donn slippers and bring foot onto thigh without difficulty.    Standing balance support: During functional activity Standing balance-Leahy Scale: Fair Standing balance comment: Able to stand statically without UE support, requries UE support for ambulation.                             Pertinent Vitals/Pain Pain Assessment: No/denies pain    Home Living Family/patient expects to be discharged to:: Private residence Living Arrangements: Spouse/significant other Available Help at Discharge: Family;Available 24 hours/day Type of Home: House Home Access: Stairs to enter Entrance Stairs-Rails: Right Entrance Stairs-Number of Steps: 2 Home Layout: One level Home Equipment: Walker - 2 wheels;Cane - single point;Shower seat;Wheelchair - manual;Bedside commode      Prior Function Level of Independence: Independent with assistive device(s)         Comments: Uses SPC vs RW for ambulation. Sits down for showers. Reports 1 fall 1 month ago due to knees.      Hand Dominance   Dominant Hand: Right    Extremity/Trunk Assessment   Upper Extremity Assessment Upper Extremity Assessment: Defer to OT evaluation    Lower Extremity Assessment Lower Extremity Assessment: Generalized weakness    Cervical / Trunk Assessment Cervical / Trunk Assessment: Kyphotic  Communication   Communication: No difficulties  Cognition Arousal/Alertness: Awake/alert Behavior During Therapy: WFL for tasks assessed/performed Overall Cognitive Status: Within Functional Limits for tasks assessed  General Comments General comments (skin integrity, edema,  etc.): Wife present during session.    Exercises     Assessment/Plan    PT Assessment Patient needs continued PT services  PT Problem List Decreased strength;Decreased mobility;Decreased balance          PT Treatment Interventions Therapeutic activities;Gait training;Therapeutic exercise;Patient/family education;Balance training;Stair training;Functional mobility training    PT Goals (Current goals can be found in the Care Plan section)  Acute Rehab PT Goals Patient Stated Goal: to go home today PT Goal Formulation: With patient/family Time For Goal Achievement: 03/08/16 Potential to Achieve Goals: Good    Frequency Min 3X/week   Barriers to discharge        Co-evaluation               End of Session Equipment Utilized During Treatment: Gait belt Activity Tolerance: Patient tolerated treatment well Patient left: in chair;with call bell/phone within reach;with family/visitor present;with nursing/sitter in room Nurse Communication: Mobility status         Time: [id number] PT Time Calculation (min) (ACUTE ONLY): 17 min   Charges:   PT Evaluation $PT Eval Moderate Complexity: 1 Procedure     PT G Codes:        Prestin Munch A Rainna Nearhood 02/23/2016, 8:39 AM Wray Kearns, PT, DPT [phone]

## 2016-02-23 NOTE — Care Management Important Message (Signed)
Important Message  Patient Details  Name: Paul Odonnell MRN: [id number] Date of Birth: 1930-04-01   Medicare Important Message Given:  Yes    Paul Odonnell 02/23/2016, 12:00 PM

## 2016-02-23 NOTE — Discharge Instructions (Signed)

## 2016-03-02 NOTE — Discharge Summary (Signed)
Physician Discharge Summary  Paul Odonnell [id number] DOB: 08-15-1930 DOA: 02/19/2016  PCP: Tommy Medal, MD  Admit date: 02/19/2016 Discharge date: 02/23/2016  Time spent: 35 minutes  Recommendations for Outpatient Follow-up:  1. PCP in 1 week 2. VVS Dr.Cain in 4-6weeks  Discharge Diagnoses:    Abd pain   Aortic dissection (HCC)   Dissection of abdominal aorta ([place])   Acute right-sided low back pain with sciatica   Discharge Condition: stable  Diet recommendation: heart healthy  Filed Weights   02/21/16 0530 02/22/16 2000 02/23/16 0415  Weight: 68 kg (149 lb 14.6 oz) 68.9 kg (151 lb 14.4 oz) 68.2 kg (150 lb 5.7 oz)    History of present illness:  Admitted with abdominal pain, severe, and persistent. CT with 3.3 cm abdominal aneurysm, and possible appendicitis  Hospital Course:  1. Hypertensive urgency with abdominal aortic aneurysm.  -controlled with labetalol 100 mg bid, blood pressure [id number] systolic -seen by vascular, Dr.Cain recommended plan to follow up in office in 4-6 weeks with AAA duplex given renal function and low suspicion of dissection based on his evaluation  2. Abd pain -transient, resolved, initially Suspected appendicitis.  -seen by [facility], not felt to be appendicitis, diet advanced and tolerating without symptoms   3. CAD. No chest pain, continue statin therapy and antiplatelet therapy.   4. CKD stage 3. Renal function with cr at 1.56 from 1,31. -stable nwo  5. T2DM.  -At home not on hypoglycemic agents. -CBGs in 120-140s, FU with PCP  6. Hypothyroid. Continue levothyroxine.   7. Anxiety/ depression. Continue clonazepam and escitalopram   Consultations:  CCS  PCCM  Discharge Exam: Vitals:   02/23/16 0415 02/23/16 0912  BP: (!) 111/92 109/64  Pulse: (!) 55 65  Resp: 18   Temp: 98.3 F (36.8 C) 97.9 F (36.6 C)    General: AAOx3 Cardiovascular: S1S2/RRR Respiratory: CTAB  Discharge Instructions   Discharge  Instructions    Diet - low sodium heart healthy    Complete by:  As directed    Increase activity slowly    Complete by:  As directed      Discharge Medication List as of 02/23/2016 10:12 AM    START taking these medications   Details  labetalol (NORMODYNE) 100 MG tablet Take 1 tablet (100 mg total) by mouth 2 (two) times daily., Starting Thu 02/23/2016, Print      CONTINUE these medications which have NOT CHANGED   Details  Ascorbic Acid (VITAMIN C) 1000 MG tablet Take 1,000 mg by mouth daily., Historical Med    aspirin EC 81 MG tablet Take 81 mg by mouth daily., Historical Med    atorvastatin (LIPITOR) 40 MG tablet take 1/2 tablet by mouth once daily, Normal    B Complex-C (SUPER B COMPLEX PO) Take 1 tablet by mouth daily., Historical Med    Cholecalciferol (VITAMIN D PO) Take 1 tablet by mouth daily., Historical Med    clonazePAM (KLONOPIN) 0.5 MG tablet Take 0.5 mg by mouth at bedtime., Until Discontinued, Historical Med    docusate sodium 100 MG CAPS Take 200 mg by mouth 2 (two) times daily., Starting 10/30/2011, Until Discontinued, No Print    escitalopram (LEXAPRO) 10 MG tablet Take 10 mg by mouth daily., Until Discontinued, Historical Med    gabapentin (NEURONTIN) 100 MG capsule Take 100 mg by mouth 2 (two) times daily., Starting Wed 01/25/2016, Historical Med    levothyroxine (SYNTHROID, LEVOTHROID) 50 MCG tablet Take 50 mcg by mouth daily before  breakfast. , Historical Med    Multiple Vitamin (MULTIVITAMIN WITH MINERALS) TABS tablet Take 1 tablet by mouth daily., Historical Med    OVER THE COUNTER MEDICATION Take 1 tablet by mouth 2 (two) times daily. OTC online supplement for knee pain - contains glucosamine, turmeric, bark, ginger etc., Historical Med    !! OXYCONTIN 20 MG 12 hr tablet Take 20 mg by mouth daily as needed (pain). , Starting Wed 01/25/2016, Historical Med    !! OXYCONTIN 30 MG T12A Take 30 mg by mouth at bedtime as needed (pain). , Starting Thu 07/22/2014,  Historical Med     !! - Potential duplicate medications found. Please discuss with provider.    STOP taking these medications     naproxen sodium (ALEVE) 220 MG tablet        Allergies  Allergen Reactions  . Codeine Other (See Comments)    Pancreatic problems   . Dilaudid [Hydromorphone Hcl] Other (See Comments)    Affects patient's thinking  . Morphine And Related Other (See Comments)    Affects patient's thinking   Follow-up Information    Servando Snare, MD Follow up in 1 month(s).   Specialties:  Vascular Surgery, Cardiology Contact information: [address] [place] [address] [phone]        PCP Follow up in 1 week(s).            The results of significant diagnostics from this hospitalization (including imaging, microbiology, ancillary and laboratory) are listed below for reference.    Significant Diagnostic Studies: Ct Abdomen Pelvis Wo Contrast  Result Date: 02/21/2016 CLINICAL DATA:  Right lower quadrant abdominal pain. EXAM: CT ABDOMEN AND PELVIS WITHOUT CONTRAST TECHNIQUE: Multidetector CT imaging of the abdomen and pelvis was performed following the standard protocol without IV contrast. COMPARISON:  02/19/2016. FINDINGS: Lower chest: No acute findings. Small lateral right middle lobe nodule. Mild dependent lung base atelectasis. Dense coronary artery calcifications. Hepatobiliary: Liver is unremarkable. Gallbladder is distended. No wall thickening or adjacent inflammation. Common bile duct is prominent measuring 9 mm. There is distal tapering. Pancreas: Pancreatic atrophy. There is relative low attenuation in the pancreatic head. Subtle haziness is seen in the fat adjacent to the pancreatic head and first and second portions of the duodenum. Pancreas otherwise unremarkable. Spleen: Normal in size without focal abnormality. Adrenals/Urinary Tract: No adrenal masses. No renal masses or stones. No hydronephrosis. Normal ureters. Bladder is unremarkable.  Stomach/Bowel: The appendix is now dilated to 11 mm. However, the wall is thin. It contains nondependent air and contrast. There is no adjacent inflammation. Stomach is unremarkable. There is no bowel dilation to suggest obstruction. There is no wall thickening or mesenteric inflammation. Air-fluid levels are noted in the small bowel and colon, nonspecific, but suggesting a mild diffuse adynamic ileus. Vascular/Lymphatic: 3 cm infrarenal abdominal aortic aneurysm. No evidence of rupture. Atherosclerotic calcifications noted along the aorta and iliac vessels and other aortic branch vessels. No pathologically enlarged lymph nodes. Reproductive: Unremarkable. Other: No abdominal wall hernia. No ascites. Increased attenuation in the retroareolar right breast is consistent with gynecomastia, stable. Musculoskeletal: Right ight hip the left LL is consistent with an old, healed fracture. No acute fracture. No osteoblastic or osteolytic lesions. IMPRESSION: 1. Since the prior exam the appendix has become more dilated, now measuring 11 mm in greatest diameter. However, contrast is seen within much of the appendix. The appendix wall is thin and there is nondependent air within the appendix and no significant adjacent inflammation. Latter findings would  strongly argue against acute appendicitis. 2. Subtle inflammatory changes are seen adjacent to the pancreatic head and duodenum. This is nonspecific. Consider duodenitis or mild focal pancreatitis. The gallbladder is also distended. The possibility of early acute cholecystitis should be considered if this correlates clinically. This could be further assessed with limited right upper quadrant ultrasound. 3. Air-fluid levels noted in the colon small bowel suggesting a mild diffuse adynamic ileus. No bowel obstruction or bowel inflammation. 4. Infrarenal abdominal aortic aneurysm.  No evidence of rupture. Electronically Signed   By: Lajean Manes M.D.   On: 02/21/2016 13:16   Dg  Chest 2 View  Result Date: 02/19/2016 CLINICAL DATA:  Chest pain. EXAM: CHEST  2 VIEW COMPARISON:  05/13/2014 FINDINGS: Cardiomediastinal silhouette is normal. Mediastinal contours appear intact. Tortuosity and calcific atherosclerotic disease of the aorta. There is no evidence of focal airspace consolidation, pleural effusion or pneumothorax. Osseous structures are without acute abnormality. Soft tissues are grossly normal. IMPRESSION: No active cardiopulmonary disease. Calcific atherosclerotic disease of the aorta and tortuosity. Electronically Signed   By: Fidela Salisbury M.D.   On: 02/19/2016 13:37   Ct Angio Chest/abd/pel For Dissection W And/or W/wo  Result Date: 02/19/2016 CLINICAL DATA:  83 year old male with chest, abdominal and pelvic pain radiating to back for 1 day. EXAM: CT ANGIOGRAPHY CHEST, ABDOMEN AND PELVIS TECHNIQUE: Multidetector CT imaging through the chest, abdomen and pelvis was performed using the standard protocol during bolus administration of intravenous contrast. Multiplanar reconstructed images and MIPs were obtained and reviewed to evaluate the vascular anatomy. CONTRAST:  80 cc intravenous Isovue 370 COMPARISON:  02/19/2016 chest radiograph.  04/27/2014 CT. FINDINGS: CTA CHEST FINDINGS Cardiovascular: Preferential opacification of the thoracic aorta. There is no evidence of thoracic aortic aneurysm or dissection. Cardiomegaly noted. Coronary artery and thoracic aortic atherosclerotic calcifications are present. There is no evidence of pericardial effusion. Mediastinum/Nodes: No enlarged mediastinal, hilar, or axillary lymph nodes. Thyroid gland, trachea, and esophagus demonstrate no significant findings. Lungs/Pleura: Lungs are clear. No pleural effusion or pneumothorax. Musculoskeletal: No chest wall abnormality. No acute or significant osseous findings. Review of the MIP images confirms the above findings. CTA ABDOMEN AND PELVIS FINDINGS VASCULAR A small focal dissection (1  cm in length) involving the anterior aspect of the distal abdominal aorta is noted (images [id number]). The abdominal aorta measures 3.3 cm in greatest diameter at this level. There is no evidence of dissection extending into the common iliac arteries are branch vessels. No adjacent hematoma is identified. Aortic atherosclerotic calcifications are noted. The celiac artery, mesenteric arteries, renal arteries and iliac arteries are patent. It is difficult to determine if this dissection was present on the prior study given prior contrast opacification. Review of the MIP images confirms the above findings. NON-VASCULAR Hepatobiliary: The liver and gallbladder are unremarkable. There is no evidence of biliary dilatation. Pancreas: Unremarkable Spleen: Unremarkable Adrenals/Urinary Tract: Bilateral renal cortical thinning noted. The adrenal glands and bladder are unremarkable. Stomach/Bowel: The appendix is enlarged (10 mm in diameter) with mild periappendiceal stranding. There is no evidence of bowel obstruction or pneumoperitoneum. Lymphatic: No enlarged lymph nodes identified. Reproductive: Prostate is unremarkable. Other: No free fluid or focal collection. Musculoskeletal: No acute or significant osseous findings. Review of the MIP images confirms the above findings. IMPRESSION: Enlarged appendix with mild adjacent inflammation likely representing acute appendicitis. No evidence free fluid, abscess or pneumoperitoneum. Small focal dissection involving the anterior aspect of the distal abdominal aorta with abdominal aortic aneurysm measuring 3.3 cm at this level. No  evidence of adjacent inflammation or hematoma. It is uncertain if this dissection was present on the prior study given prior contrast opacification of the aorta. No evidence of thoracic aortic aneurysm or dissection. Dr. Claretta Fraise notified these results at 5:10 p.m. on 02/19/2016. Electronically Signed   By: Margarette Canada M.D.   On: 02/19/2016 17:10     Microbiology: No results found for this or any previous visit (from the past 240 hour(s)).   Labs: Basic Metabolic Panel: No results for input(s): NA, K, CL, CO2, GLUCOSE, BUN, CREATININE, CALCIUM, MG, PHOS in the last 168 hours. Liver Function Tests: No results for input(s): AST, ALT, ALKPHOS, BILITOT, PROT, ALBUMIN in the last 168 hours. No results for input(s): LIPASE, AMYLASE in the last 168 hours. No results for input(s): AMMONIA in the last 168 hours. CBC: No results for input(s): WBC, NEUTROABS, HGB, HCT, MCV, PLT in the last 168 hours. Cardiac Enzymes: No results for input(s): CKTOTAL, CKMB, CKMBINDEX, TROPONINI in the last 168 hours. BNP: BNP (last 3 results) No results for input(s): BNP in the last 8760 hours.  ProBNP (last 3 results) No results for input(s): PROBNP in the last 8760 hours.  CBG: No results for input(s): GLUCAP in the last 168 hours.     SignedDomenic Polite MD.  [facility] 03/02/2016, 2:10 PM

## 2016-03-21 ENCOUNTER — Other Ambulatory Visit: Payer: Self-pay | Admitting: Internal Medicine

## 2016-03-21 DIAGNOSIS — Z8679 Personal history of other diseases of the circulatory system: Secondary | ICD-10-CM

## 2016-03-21 DIAGNOSIS — I7102 Dissection of abdominal aorta: Secondary | ICD-10-CM

## 2016-03-21 DIAGNOSIS — I714 Abdominal aortic aneurysm, without rupture, unspecified: Secondary | ICD-10-CM

## 2016-03-22 ENCOUNTER — Ambulatory Visit
Admission: RE | Admit: 2016-03-22 | Discharge: 2016-03-22 | Disposition: A | Discharge status: Home / Self Care | Payer: Medicare Other | Source: Ambulatory Visit | Attending: Internal Medicine | Admitting: Internal Medicine

## 2016-03-22 DIAGNOSIS — I7102 Dissection of abdominal aorta: Secondary | ICD-10-CM

## 2016-03-22 DIAGNOSIS — I714 Abdominal aortic aneurysm, without rupture, unspecified: Secondary | ICD-10-CM

## 2016-03-22 MED ORDER — IOPAMIDOL (ISOVUE-370) INJECTION 76%
50.0000 mL | Freq: Once | INTRAVENOUS | Status: AC | PRN
  Administered 2016-03-22: 50 mL via INTRAVENOUS

## 2016-03-23 ENCOUNTER — Inpatient Hospital Stay (HOSPITAL_COMMUNITY)
Admission: EM | Admit: 2016-03-23 | Discharge: 2016-03-26 | DRG: 438 | Disposition: A | Discharge status: Home Health | Payer: Medicare Other | Attending: Internal Medicine | Admitting: Internal Medicine

## 2016-03-23 ENCOUNTER — Emergency Department (HOSPITAL_COMMUNITY): Payer: Medicare Other

## 2016-03-23 ENCOUNTER — Encounter (HOSPITAL_COMMUNITY): Payer: Self-pay

## 2016-03-23 DIAGNOSIS — K222 Esophageal obstruction: Secondary | ICD-10-CM | POA: Diagnosis present

## 2016-03-23 DIAGNOSIS — D649 Anemia, unspecified: Secondary | ICD-10-CM | POA: Diagnosis present

## 2016-03-23 DIAGNOSIS — Z79891 Long term (current) use of opiate analgesic: Secondary | ICD-10-CM

## 2016-03-23 DIAGNOSIS — J45909 Unspecified asthma, uncomplicated: Secondary | ICD-10-CM | POA: Diagnosis present

## 2016-03-23 DIAGNOSIS — N189 Chronic kidney disease, unspecified: Secondary | ICD-10-CM | POA: Diagnosis present

## 2016-03-23 DIAGNOSIS — K859 Acute pancreatitis without necrosis or infection, unspecified: Secondary | ICD-10-CM | POA: Diagnosis not present

## 2016-03-23 DIAGNOSIS — Z833 Family history of diabetes mellitus: Secondary | ICD-10-CM

## 2016-03-23 DIAGNOSIS — K589 Irritable bowel syndrome without diarrhea: Secondary | ICD-10-CM | POA: Diagnosis present

## 2016-03-23 DIAGNOSIS — R52 Pain, unspecified: Secondary | ICD-10-CM

## 2016-03-23 DIAGNOSIS — Z66 Do not resuscitate: Secondary | ICD-10-CM | POA: Diagnosis present

## 2016-03-23 DIAGNOSIS — Z7982 Long term (current) use of aspirin: Secondary | ICD-10-CM

## 2016-03-23 DIAGNOSIS — E860 Dehydration: Secondary | ICD-10-CM | POA: Diagnosis present

## 2016-03-23 DIAGNOSIS — F17200 Nicotine dependence, unspecified, uncomplicated: Secondary | ICD-10-CM | POA: Diagnosis present

## 2016-03-23 DIAGNOSIS — E7800 Pure hypercholesterolemia, unspecified: Secondary | ICD-10-CM | POA: Diagnosis present

## 2016-03-23 DIAGNOSIS — R109 Unspecified abdominal pain: Secondary | ICD-10-CM

## 2016-03-23 DIAGNOSIS — E1122 Type 2 diabetes mellitus with diabetic chronic kidney disease: Secondary | ICD-10-CM | POA: Diagnosis present

## 2016-03-23 DIAGNOSIS — Z8249 Family history of ischemic heart disease and other diseases of the circulatory system: Secondary | ICD-10-CM

## 2016-03-23 DIAGNOSIS — Z885 Allergy status to narcotic agent status: Secondary | ICD-10-CM

## 2016-03-23 DIAGNOSIS — Z923 Personal history of irradiation: Secondary | ICD-10-CM

## 2016-03-23 DIAGNOSIS — E785 Hyperlipidemia, unspecified: Secondary | ICD-10-CM | POA: Diagnosis present

## 2016-03-23 DIAGNOSIS — G8929 Other chronic pain: Secondary | ICD-10-CM | POA: Diagnosis present

## 2016-03-23 DIAGNOSIS — I252 Old myocardial infarction: Secondary | ICD-10-CM

## 2016-03-23 DIAGNOSIS — I714 Abdominal aortic aneurysm, without rupture, unspecified: Secondary | ICD-10-CM | POA: Diagnosis present

## 2016-03-23 DIAGNOSIS — N183 Chronic kidney disease, stage 3 (moderate): Secondary | ICD-10-CM | POA: Diagnosis not present

## 2016-03-23 DIAGNOSIS — R410 Disorientation, unspecified: Secondary | ICD-10-CM | POA: Diagnosis not present

## 2016-03-23 DIAGNOSIS — K5790 Diverticulosis of intestine, part unspecified, without perforation or abscess without bleeding: Secondary | ICD-10-CM | POA: Diagnosis present

## 2016-03-23 DIAGNOSIS — I10 Essential (primary) hypertension: Secondary | ICD-10-CM | POA: Diagnosis present

## 2016-03-23 DIAGNOSIS — I2510 Atherosclerotic heart disease of native coronary artery without angina pectoris: Secondary | ICD-10-CM | POA: Diagnosis present

## 2016-03-23 DIAGNOSIS — G9340 Encephalopathy, unspecified: Secondary | ICD-10-CM | POA: Diagnosis present

## 2016-03-23 DIAGNOSIS — Z79899 Other long term (current) drug therapy: Secondary | ICD-10-CM

## 2016-03-23 DIAGNOSIS — K449 Diaphragmatic hernia without obstruction or gangrene: Secondary | ICD-10-CM | POA: Diagnosis present

## 2016-03-23 DIAGNOSIS — I129 Hypertensive chronic kidney disease with stage 1 through stage 4 chronic kidney disease, or unspecified chronic kidney disease: Secondary | ICD-10-CM | POA: Diagnosis present

## 2016-03-23 DIAGNOSIS — M1990 Unspecified osteoarthritis, unspecified site: Secondary | ICD-10-CM | POA: Diagnosis present

## 2016-03-23 DIAGNOSIS — Z781 Physical restraint status: Secondary | ICD-10-CM

## 2016-03-23 DIAGNOSIS — Z955 Presence of coronary angioplasty implant and graft: Secondary | ICD-10-CM

## 2016-03-23 DIAGNOSIS — R451 Restlessness and agitation: Secondary | ICD-10-CM | POA: Diagnosis not present

## 2016-03-23 DIAGNOSIS — E039 Hypothyroidism, unspecified: Secondary | ICD-10-CM | POA: Diagnosis present

## 2016-03-23 DIAGNOSIS — K219 Gastro-esophageal reflux disease without esophagitis: Secondary | ICD-10-CM | POA: Diagnosis present

## 2016-03-23 DIAGNOSIS — F329 Major depressive disorder, single episode, unspecified: Secondary | ICD-10-CM | POA: Diagnosis present

## 2016-03-23 DIAGNOSIS — N179 Acute kidney failure, unspecified: Secondary | ICD-10-CM | POA: Diagnosis present

## 2016-03-23 DIAGNOSIS — G9349 Other encephalopathy: Secondary | ICD-10-CM | POA: Diagnosis not present

## 2016-03-23 DIAGNOSIS — Z8546 Personal history of malignant neoplasm of prostate: Secondary | ICD-10-CM

## 2016-03-23 DIAGNOSIS — K861 Other chronic pancreatitis: Secondary | ICD-10-CM | POA: Diagnosis present

## 2016-03-23 LAB — CBC
HCT: 36 % — ABNORMAL LOW (ref 39.0–52.0)
HEMOGLOBIN: 12.1 g/dL — AB (ref 13.0–17.0)
MCH: 32.4 pg (ref 26.0–34.0)
MCHC: 33.6 g/dL (ref 30.0–36.0)
MCV: 96.3 fL (ref 78.0–100.0)
PLATELETS: 185 10*3/uL (ref 150–400)
RBC: 3.74 MIL/uL — ABNORMAL LOW (ref 4.22–5.81)
RDW: 13.2 % (ref 11.5–15.5)
WBC: 8 10*3/uL (ref 4.0–10.5)

## 2016-03-23 LAB — LIPASE, BLOOD: Lipase: 22 U/L (ref 11–51)

## 2016-03-23 LAB — BASIC METABOLIC PANEL
ANION GAP: 10 (ref 5–15)
BUN: 40 mg/dL — ABNORMAL HIGH (ref 6–20)
CALCIUM: 9.7 mg/dL (ref 8.9–10.3)
CO2: 25 mmol/L (ref 22–32)
CREATININE: 1.91 mg/dL — AB (ref 0.61–1.24)
Chloride: 103 mmol/L (ref 101–111)
GFR calc Af Amer: 36 mL/min — ABNORMAL LOW (ref >60)
GFR, EST NON AFRICAN AMERICAN: 31 mL/min — AB (ref >60)
GLUCOSE: 142 mg/dL — AB (ref 65–99)
Potassium: 3.9 mmol/L (ref 3.5–5.1)
Sodium: 138 mmol/L (ref 135–145)

## 2016-03-23 LAB — HEPATIC FUNCTION PANEL
ALT: 16 U/L — AB (ref 17–63)
AST: 27 U/L (ref 15–41)
Albumin: 3.9 g/dL (ref 3.5–5.0)
Alkaline Phosphatase: 80 U/L (ref 38–126)
BILIRUBIN TOTAL: 0.9 mg/dL (ref 0.3–1.2)
Total Protein: 6.7 g/dL (ref 6.5–8.1)

## 2016-03-23 LAB — I-STAT TROPONIN, ED: TROPONIN I, POC: 0 ng/mL (ref 0.00–0.08)

## 2016-03-23 MED ORDER — SODIUM CHLORIDE 0.9 % IV SOLN: Freq: Once | INTRAVENOUS | Status: DC

## 2016-03-23 MED ORDER — ISOSORBIDE MONONITRATE ER 30 MG PO TB24
30.0000 mg | ORAL_TABLET | Freq: Every day | ORAL | Status: DC
  Administered 2016-03-23 – 2016-03-26 (×4): 30 mg via ORAL
  Filled 2016-03-23 (×4): qty 1

## 2016-03-23 MED ORDER — ESCITALOPRAM OXALATE 10 MG PO TABS
10.0000 mg | ORAL_TABLET | Freq: Every day | ORAL | Status: DC
  Administered 2016-03-23 – 2016-03-25 (×3): 10 mg via ORAL
  Filled 2016-03-23 (×3): qty 1

## 2016-03-23 MED ORDER — GABAPENTIN 100 MG PO CAPS
100.0000 mg | ORAL_CAPSULE | Freq: Three times a day (TID) | ORAL | Status: DC
  Administered 2016-03-23 – 2016-03-26 (×8): 100 mg via ORAL
  Filled 2016-03-23 (×8): qty 1

## 2016-03-23 MED ORDER — OXYCODONE HCL 5 MG PO TABS
5.0000 mg | ORAL_TABLET | ORAL | Status: DC | PRN
  Administered 2016-03-23 – 2016-03-24 (×3): 5 mg via ORAL
  Filled 2016-03-23 (×4): qty 1

## 2016-03-23 MED ORDER — ASPIRIN EC 81 MG PO TBEC
81.0000 mg | DELAYED_RELEASE_TABLET | Freq: Every day | ORAL | Status: DC
  Administered 2016-03-23 – 2016-03-26 (×4): 81 mg via ORAL
  Filled 2016-03-23 (×4): qty 1

## 2016-03-23 MED ORDER — FENTANYL CITRATE (PF) 100 MCG/2ML IJ SOLN
50.0000 ug | Freq: Once | INTRAMUSCULAR | Status: AC
  Administered 2016-03-23: 50 ug via INTRAVENOUS
  Filled 2016-03-23: qty 2

## 2016-03-23 MED ORDER — LEVOTHYROXINE SODIUM 50 MCG PO TABS
50.0000 ug | ORAL_TABLET | Freq: Every day | ORAL | Status: DC
  Administered 2016-03-23 – 2016-03-26 (×4): 50 ug via ORAL
  Filled 2016-03-23 (×4): qty 1

## 2016-03-23 MED ORDER — SODIUM CHLORIDE 0.9 % IV BOLUS (SEPSIS)
1000.0000 mL | Freq: Once | INTRAVENOUS | Status: AC
  Administered 2016-03-23: 1000 mL via INTRAVENOUS

## 2016-03-23 MED ORDER — HYDRALAZINE HCL 20 MG/ML IJ SOLN
10.0000 mg | Freq: Once | INTRAMUSCULAR | Status: AC
  Administered 2016-03-23: 10 mg via INTRAVENOUS
  Filled 2016-03-23: qty 1

## 2016-03-23 MED ORDER — OXYCODONE HCL ER 10 MG PO T12A
20.0000 mg | EXTENDED_RELEASE_TABLET | ORAL | Status: DC
  Administered 2016-03-24 – 2016-03-26 (×3): 20 mg via ORAL
  Filled 2016-03-23 (×3): qty 2

## 2016-03-23 MED ORDER — PANCRELIPASE (LIP-PROT-AMYL) 36000-114000 UNITS PO CPEP
36000.0000 [IU] | ORAL_CAPSULE | Freq: Three times a day (TID) | ORAL | Status: DC
  Administered 2016-03-24 – 2016-03-26 (×8): 36000 [IU] via ORAL
  Filled 2016-03-23 (×8): qty 1

## 2016-03-23 MED ORDER — HEPARIN SODIUM (PORCINE) 5000 UNIT/ML IJ SOLN
5000.0000 [IU] | Freq: Three times a day (TID) | INTRAMUSCULAR | Status: DC
  Administered 2016-03-23 – 2016-03-26 (×7): 5000 [IU] via SUBCUTANEOUS
  Filled 2016-03-23 (×7): qty 1

## 2016-03-23 MED ORDER — SODIUM CHLORIDE 0.9 % IV SOLN
INTRAVENOUS | Status: DC
  Administered 2016-03-23 – 2016-03-26 (×3): via INTRAVENOUS

## 2016-03-23 MED ORDER — SODIUM CHLORIDE 0.9% FLUSH
3.0000 mL | Freq: Two times a day (BID) | INTRAVENOUS | Status: DC
  Administered 2016-03-23 – 2016-03-26 (×2): 3 mL via INTRAVENOUS

## 2016-03-23 MED ORDER — POLYETHYLENE GLYCOL 3350 17 G PO PACK
17.0000 g | PACK | Freq: Every day | ORAL | Status: DC
Start: 2016-03-24 — End: 2016-03-25
  Administered 2016-03-24: 17 g via ORAL
  Filled 2016-03-23: qty 1

## 2016-03-23 MED ORDER — HYDRALAZINE HCL 20 MG/ML IJ SOLN
10.0000 mg | INTRAMUSCULAR | Status: DC | PRN
  Filled 2016-03-23: qty 1

## 2016-03-23 MED ORDER — IOPAMIDOL (ISOVUE-300) INJECTION 61%
INTRAVENOUS | Status: AC
  Filled 2016-03-23: qty 30

## 2016-03-23 MED ORDER — CLONAZEPAM 0.5 MG PO TABS
0.5000 mg | ORAL_TABLET | Freq: Every day | ORAL | Status: DC
  Administered 2016-03-23 – 2016-03-24 (×2): 0.5 mg via ORAL
  Filled 2016-03-23 (×2): qty 1

## 2016-03-23 MED ORDER — ONDANSETRON HCL 4 MG/2ML IJ SOLN
4.0000 mg | Freq: Four times a day (QID) | INTRAMUSCULAR | Status: DC | PRN
  Administered 2016-03-23: 4 mg via INTRAVENOUS
  Filled 2016-03-23: qty 2

## 2016-03-23 MED ORDER — ACETAMINOPHEN 325 MG PO TABS: 650.0000 mg | ORAL_TABLET | Freq: Four times a day (QID) | ORAL | Status: DC | PRN

## 2016-03-23 MED ORDER — OXYCODONE HCL ER 15 MG PO T12A
30.0000 mg | EXTENDED_RELEASE_TABLET | Freq: Every evening | ORAL | Status: DC
  Administered 2016-03-23 – 2016-03-25 (×3): 30 mg via ORAL
  Filled 2016-03-23 (×3): qty 2

## 2016-03-23 MED ORDER — ONDANSETRON HCL 4 MG/2ML IJ SOLN
4.0000 mg | Freq: Once | INTRAMUSCULAR | Status: AC
Start: 2016-03-23 — End: 2016-03-23
  Administered 2016-03-23: 4 mg via INTRAVENOUS
  Filled 2016-03-23: qty 2

## 2016-03-23 MED ORDER — ACETAMINOPHEN 650 MG RE SUPP: 650.0000 mg | Freq: Four times a day (QID) | RECTAL | Status: DC | PRN

## 2016-03-23 MED ORDER — DOCUSATE SODIUM 100 MG PO CAPS
100.0000 mg | ORAL_CAPSULE | Freq: Two times a day (BID) | ORAL | Status: DC
  Administered 2016-03-23 – 2016-03-26 (×6): 100 mg via ORAL
  Filled 2016-03-23 (×6): qty 1

## 2016-03-23 MED ORDER — ONDANSETRON HCL 4 MG PO TABS: 4.0000 mg | ORAL_TABLET | Freq: Four times a day (QID) | ORAL | Status: DC | PRN

## 2016-03-23 NOTE — ED Provider Notes (Signed)
[facility] DEPT Provider Note   CSN: [id number] Arrival date & time: 03/23/16  1127     History   Chief Complaint Chief Complaint  Patient presents with  . Chest Pain    HPI Paul Odonnell is a 83 y.o. male.  HPI   83 year old male with past medical history as below including recent admission for symptomatic AAA, hypertension, hyperlipidemia, recurrent pancreatitis, who presents with epigastric pain. Patient states he was recently hospitalized for abdominal pain. Since then, he has had little to no appetite. He has not been eating and drinking well. Over the last 48 hours, he has developed progressively worsening gnawing, aching, epigastric abdominal pain. He has had nausea as well but no vomiting. He denies any diarrhea. Denies any fevers or chills. His symptoms are worse with eating and are similar to his previous episodes of pancreatitis. Of note, he did just receive a CT angiogram yesterday which was unremarkable.  Past Medical History:  Diagnosis Date  . AAA (abdominal aortic aneurysm) ([place])   . Anemia   . Asthma   . BPH (benign prostatic hyperplasia)   . CAD (coronary artery disease)   . Depression   . Diverticulosis   . DM (diabetes mellitus) ([place])   . Gastritis   . GERD (gastroesophageal reflux disease)   . HH (hiatus hernia)   . HTN (hypertension)   . Hypothyroidism   . IBS (irritable bowel syndrome)   . MI (myocardial infarction)   . Osteoarthritis   . Pancreatitis    with pancreas divisum  . Peptic stricture of esophagus   . Prostate cancer ([place])    radiation 2011  . Pure hypercholesterolemia   . Renal insufficiency   . Sepsis due to Pseudomonas ([place])    pyelonephritis  . Urinary obstruction   . Vitamin B 12 deficiency     Patient Active Problem List   Diagnosis Date Noted  . Dissection of abdominal aorta ([place])   . Acute right-sided low back pain with sciatica   . Aortic dissection ([place]) 02/19/2016  . CKD (chronic kidney disease) 04/23/2014  .  Chronic abdominal pain 04/23/2014  . CAP (community acquired pneumonia) 04/22/2014  . Coronary artery disease involving native coronary artery of native heart without angina pectoris 08/05/2013  . Essential hypertension 08/05/2013  . Hyperlipidemia 08/05/2013  . Old MI (myocardial infarction) 08/05/2013  . Blunt abdominal trauma 10/26/2011  . Blunt chest trauma 10/26/2011  . Acute blood loss anemia 10/26/2011  . Chronic anemia 10/26/2011  . Fracture of iliac wing ([place]) 10/25/2011  . Multiple rib fractures 10/25/2011  . Adrenal hematoma 10/25/2011  . Pneumomediastinum ([place]) 10/25/2011    Past Surgical History:  Procedure Laterality Date  . CORONARY ANGIOPLASTY WITH STENT PLACEMENT         Home Medications    Prior to Admission medications   Medication Sig Start Date End Date Taking? Authorizing Provider  Ascorbic Acid (VITAMIN C) 1000 MG tablet Take 1,000 mg by mouth daily.    Historical Provider, MD  atorvastatin (LIPITOR) 40 MG tablet take 1/2 tablet by mouth once daily Patient taking differently: take 1/2 tablet by mouth once daily at bedtime 02/15/16   Jerline Pain, MD  B Complex-C (SUPER B COMPLEX PO) Take 1 tablet by mouth daily.    Historical Provider, MD  Cholecalciferol (VITAMIN D PO) Take 1 tablet by mouth daily.    Historical Provider, MD  clonazePAM (KLONOPIN) 0.5 MG tablet Take 0.5 mg by mouth at bedtime.    Historical  Provider, MD  docusate sodium 100 MG CAPS Take 200 mg by mouth 2 (two) times daily. Patient taking differently: Take 400 mg by mouth daily with supper.  10/30/11   Lisette Abu, PA-C  escitalopram (LEXAPRO) 10 MG tablet Take 10 mg by mouth daily.    Historical Provider, MD  gabapentin (NEURONTIN) 100 MG capsule Take 100 mg by mouth 2 (two) times daily. 01/25/16   Historical Provider, MD  labetalol (NORMODYNE) 100 MG tablet Take 1 tablet (100 mg total) by mouth 2 (two) times daily. 02/23/16   Domenic Polite, MD  levothyroxine (SYNTHROID, LEVOTHROID) 50  MCG tablet Take 50 mcg by mouth daily before breakfast.     Historical Provider, MD  Multiple Vitamin (MULTIVITAMIN WITH MINERALS) TABS tablet Take 1 tablet by mouth daily.    Historical Provider, MD  OVER THE COUNTER MEDICATION Take 1 tablet by mouth 2 (two) times daily. OTC online supplement for knee pain - contains glucosamine, turmeric, bark, ginger etc.    Historical Provider, MD  OXYCONTIN 20 MG 12 hr tablet Take 20 mg by mouth daily as needed (pain).  01/25/16   Historical Provider, MD  OXYCONTIN 30 MG T12A Take 30 mg by mouth at bedtime as needed (pain).  07/22/14   Historical Provider, MD    Family History Family History  Problem Relation Age of Onset  . Diabetes Mother   . Heart attack Father   . Heart attack Brother     x 2 brothers    Social History Social History  Substance Use Topics  . Smoking status: Current Some Day Smoker  . Smokeless tobacco: Current User  . Alcohol use No     Allergies   Codeine; Dilaudid [hydromorphone hcl]; and Morphine and related   Review of Systems Review of Systems  Constitutional: Positive for fatigue. Negative for chills and fever.  HENT: Negative for congestion and rhinorrhea.   Eyes: Negative for visual disturbance.  Respiratory: Negative for cough, shortness of breath and wheezing.   Cardiovascular: Positive for chest pain. Negative for leg swelling.  Gastrointestinal: Positive for abdominal pain and nausea. Negative for diarrhea and vomiting.  Genitourinary: Negative for dysuria and flank pain.  Musculoskeletal: Negative for neck pain and neck stiffness.  Skin: Negative for rash and wound.  Allergic/Immunologic: Negative for immunocompromised state.  Neurological: Negative for syncope, weakness and headaches.  All other systems reviewed and are negative.    Physical Exam Updated Vital Signs BP 159/79   Pulse 74   Temp 97.5 F (36.4 C) (Oral)   Resp 14   Ht 5\' 11"  (1.803 m)   Wt 148 lb (67.1 kg)   SpO2 98%   BMI  20.64 kg/m   Physical Exam  Constitutional: He is oriented to person, place, and time. He appears well-developed and well-nourished. No distress.  HENT:  Head: Normocephalic and atraumatic.  Eyes: Conjunctivae are normal.  Neck: Neck supple.  Cardiovascular: Normal rate, regular rhythm and normal heart sounds.  Exam reveals no friction rub.   No murmur heard. Pulmonary/Chest: Effort normal and breath sounds normal. No respiratory distress. He has no wheezes. He has no rales.  Abdominal: Normal appearance and bowel sounds are normal. He exhibits no distension. There is tenderness in the epigastric area. There is guarding. There is no rigidity.  Musculoskeletal: He exhibits no edema.  Neurological: He is alert and oriented to person, place, and time. He exhibits normal muscle tone.  Skin: Skin is warm. Capillary refill takes less than 2  seconds.  Psychiatric: He has a normal mood and affect.  Nursing note and vitals reviewed.    ED Treatments / Results  Labs (all labs ordered are listed, but only abnormal results are displayed) Labs Reviewed  BASIC METABOLIC PANEL - Abnormal; Notable for the following:       Result Value   Glucose, Bld 142 (*)    BUN 40 (*)    Creatinine, Ser 1.91 (*)    GFR calc non Af Amer 31 (*)    GFR calc Af Amer 36 (*)    All other components within normal limits  CBC - Abnormal; Notable for the following:    RBC 3.74 (*)    Hemoglobin 12.1 (*)    HCT 36.0 (*)    All other components within normal limits  HEPATIC FUNCTION PANEL - Abnormal; Notable for the following:    ALT 16 (*)    Bilirubin, Direct <0.1 (*)    All other components within normal limits  LIPASE, BLOOD  URINALYSIS, ROUTINE W REFLEX MICROSCOPIC  I-STAT TROPOININ, ED    EKG  EKG Interpretation  Date/Time:  Friday March 23 2016 11:34:33 EST Ventricular Rate:  81 PR Interval:  200 QRS Duration: 90 QT Interval:  384 QTC Calculation: 446 R Axis:   -52 Text Interpretation:  Normal  sinus rhythm Left anterior fascicular block Cannot rule out Anterior infarct , age undetermined Abnormal ECG Since last EKG, non-specific ST changes no signs of acute ischemia or infarct Confirmed by Khalila Buechner MD, Carloyn Lahue [phone]) on 03/23/2016 2:50:07 PM       Radiology Ct Abdomen Pelvis Wo Contrast  Result Date: 03/23/2016 CLINICAL DATA:  Acute generalized abdominal pain. EXAM: CT ABDOMEN AND PELVIS WITHOUT CONTRAST TECHNIQUE: Multidetector CT imaging of the abdomen and pelvis was performed following the standard protocol without IV contrast. COMPARISON:  CT scan of March 22, 2016. FINDINGS: Lower chest: No acute abnormality. Hepatobiliary: No focal liver abnormality is seen. No gallstones, gallbladder wall thickening, or biliary dilatation. Pancreas: Pancreas appears to be thin and atrophic. Inflammatory changes are noted around the pancreatic head suggesting possible pancreatitis. Spleen: Normal in size without focal abnormality. Adrenals/Urinary Tract: Adrenal glands are unremarkable. Kidneys are normal, without renal calculi, focal lesion, or hydronephrosis. Bladder is unremarkable. Stomach/Bowel: Stomach is within normal limits. Appendix appears normal. No evidence of bowel wall thickening, distention, or inflammatory changes. Vascular/Lymphatic: 3.6 cm infrarenal abdominal aortic aneurysm is noted. Atherosclerosis is noted. No significant adenopathy is noted. Reproductive: Prostate is unremarkable. Other: No abdominal wall hernia or abnormality. No abdominopelvic ascites. Musculoskeletal: No acute or significant osseous findings. IMPRESSION: 3.6 cm infrarenal abdominal aortic aneurysm. Recommend followup by ultrasound in 2 years. This recommendation follows ACR consensus guidelines: White Paper of the ACR Incidental Findings Committee II on Vascular Findings. J Am Coll Radiol 2013; 10:789-794. Possible inflammatory changes are noted around the pancreatic head which may represent pancreatitis. Clinical  correlation is recommended. Electronically Signed   By: Marijo Conception, M.D.   On: 03/23/2016 16:04   Dg Chest 2 View  Result Date: 03/23/2016 CLINICAL DATA:  Chest pain EXAM: CHEST  2 VIEW COMPARISON:  None. FINDINGS: The heart size and mediastinal contours are within normal limits. Both lungs are clear. The visualized skeletal structures are unremarkable. IMPRESSION: No active cardiopulmonary disease. Electronically Signed   By: Kathreen Devoid   On: 03/23/2016 13:00   Ct Angio Abdomen Pelvis  W &/or Wo Contrast  Result Date: 03/22/2016 CLINICAL DATA:  Abdominal aortic aneurysm EXAM:  CT ANGIOGRAPHY ABDOMEN AND PELVIS TECHNIQUE: Multidetector CT imaging of the abdomen and pelvis was performed using the standard protocol during bolus administration of intravenous contrast. Multiplanar reconstructed images including MIPs were obtained and reviewed to evaluate the vascular anatomy. CONTRAST:  50 cc Isovue 370 COMPARISON:  04/27/2014, 02/19/2016 FINDINGS: Arterial findings: Aorta: Diffuse moderate atherosclerosis of the aorta. Infrarenal fusiform abdominal aortic aneurysm, maximal AP diameter 3.5 cm, transverse diameter 3 cm. This is slightly enlarged compared to 04/27/2014 measuring 3.0 x 2.8 cm, but stable compared to 02/21/2016. Celiac axis:          Widely patent origin and branches. Superior mesenteric:  Widely patent origin and branches. Left renal: Atherosclerotic origin but remains patent. Small accessory left lower pole renal artery is also patent. Right renal: Atherosclerotic origin with mild luminal narrowing. Small accessory branch to the right kidney lower pole is also patent. Inferior mesenteric:  IMA remains patent off of the anterior aorta. Left iliac: Left common, internal and external iliac arteries are tortuous and atherosclerotic but patent. Right iliac: Right common, internal and external iliac arteries are also patent with mild tortuosity. Mild atherosclerotic changes evident. No inflow  disease. Venous findings:      No significant finding. Review of the MIP images confirms the above findings. Nonvascular findings: No acute process in the lower chest. Remote granulomatous disease evident with left infrahilar calcified lymph nodes. No pericardial or pleural effusion. Native coronary atherosclerosis evident. No focal hepatic abnormality within the limits of arterial phase imaging. No biliary dilatation. Gallbladder is mildly distended. Slight biliary prominence. Appearance remains nonspecific and unchanged. Pancreas is thin and atrophic without significant ductal dilatation or focal abnormality. No surrounding inflammatory process or fluid collection. Normal arterial phase enhancement of the spleen. Adrenal glands, kidneys, ureters and bladder are within normal limits for age. No renal obstruction or hydronephrosis. No obstructing ureteral calculus. Negative for bowel obstruction, significant dilatation, ileus, or free air. No fluid collection or abscess. Appendix is unremarkable. No adenopathy Prostate gland is normal in size. No inguinal abnormality or hernia.  Intact abdominal wall. Healed deformity of the left iliac crest. No acute osseous finding. No acute compression fracture. IMPRESSION: 3.5 cm infrarenal atherosclerotic aneurysm, slightly larger than 04/27/2014 but stable compared to 02/21/2016. Otherwise stable CT exam of the abdomen and pelvis. Electronically Signed   By: Jerilynn Mages.  Shick M.D.   On: 03/22/2016 17:02    Procedures Procedures (including critical care time)  Medications Ordered in ED Medications  sodium chloride 0.9 % bolus 1,000 mL (1,000 mLs Intravenous New Bag/Given 03/23/16 1531)  iopamidol (ISOVUE-300) 61 % injection (not administered)  fentaNYL (SUBLIMAZE) injection 50 mcg (50 mcg Intravenous Given 03/23/16 1530)  ondansetron (ZOFRAN) injection 4 mg (4 mg Intravenous Given 03/23/16 1530)  hydrALAZINE (APRESOLINE) injection 10 mg (10 mg Intravenous Given 03/23/16 1529)      Initial Impression / Assessment and Plan / ED Course  I have reviewed the triage vital signs and the nursing notes.  Pertinent labs & imaging results that were available during my care of the patient were reviewed by me and considered in my medical decision making (see chart for details).     83 year old male with past medical history as above who presents with diffuse abdominal pain and nausea. Patient has complicated recent history including known AAA as well as recurrent pancreatitis. On arrival, he is in moderate distress due to pain with diffuse abdominal tenderness. Labwork obtained in triage shows normal white count. He does have a moderate acute  on chronic kidney injury, with elevated BUN consistent with prerenal etiology and he does appear dehydrated clinically. Will start IV fluids, give pain control, and repeat CT due to abdominal pain with known AAA as well as recurrent pancreatitis with concern for complication.  Lipase normal but CT scan shows stranding about pancreas, c/w clinical pancreatitis. Given his nausea, inability to tolerate PO, and AKI 2/2 dehydration, will admit for IVF, pain control. Pain, BP improving.  Final Clinical Impressions(s) / ED Diagnoses   Final diagnoses:  Acute pancreatitis, unspecified complication status, unspecified pancreatitis type  AKI (acute kidney injury) ([place])      Duffy Bruce, MD 03/24/16 [phone]

## 2016-03-23 NOTE — ED Triage Notes (Signed)
Per Pt, Pt is coming from home with complaints of CP and lightheadedness that started an hour. Described as [facility]-dcenter aching chest pain. Denies any SOB, N/V. Pt is alert and oriented x4 upon arrival.

## 2016-03-23 NOTE — ED Notes (Addendum)
Patient transported to XR. 

## 2016-03-23 NOTE — ED Notes (Signed)
ED Provider at bedside. 

## 2016-03-23 NOTE — ED Notes (Signed)
Pt c/o pain in upper abdomen stating that he was recently seen for pancreatitis and "that is what this feels like but worse".

## 2016-03-23 NOTE — H&P (Signed)
[facility] History and Physical   [name] [id number]   PCP: Jani Gravel, MD DOB: 1930-12-06   DOA: 03/23/2016   DOS: 03/23/2016   DOS: the patient was seen and examined on 03/23/2016  Patient coming from: The patient is coming from home.  Chief Complaint: Abdominal pain ongoing for last 3 days  HPI: Paul Odonnell is a 83 y.o. male with Past medical history of chronic pancreatitis, AAA, CAD, depression, GERD, HTN. Patient doesn't do with complaints of abdominal pain ongoing for last 3 days. Patient has not been able to eat anything for last 4 days as well. She denies any nausea or vomiting. He denies any diarrhea or constipation as well. He was seen by his PCP and they increased his Creon but that has not helped his pain. Denies any dizziness or lightheadedness. Denies any active bleeding anywhere. Denies any other changes in his medication.  ED Course: CT scan of the abdomen was suggestive of mild pancreatitis. Pain improved significantly with initial treatment in the ER. Patient was referred for admission.  At his baseline ambulates with walker And is independent for most of his ADL; manages his medication on his own.  Review of Systems: as mentioned in the history of present illness.  All other systems reviewed and are negative.  Past Medical History:  Diagnosis Date  . AAA (abdominal aortic aneurysm) ([place])   . Anemia   . Asthma   . BPH (benign prostatic hyperplasia)   . CAD (coronary artery disease)   . Depression   . Diverticulosis   . DM (diabetes mellitus) ([place])   . Gastritis   . GERD (gastroesophageal reflux disease)   . HH (hiatus hernia)   . HTN (hypertension)   . Hypothyroidism   . IBS (irritable bowel syndrome)   . MI (myocardial infarction)   . Osteoarthritis   . Pancreatitis    with pancreas divisum  . Peptic stricture of esophagus   . Prostate cancer ([place])    radiation 2011  . Pure hypercholesterolemia   . Renal insufficiency   .  Sepsis due to Pseudomonas ([place])    pyelonephritis  . Urinary obstruction   . Vitamin B 12 deficiency    Past Surgical History:  Procedure Laterality Date  . CORONARY ANGIOPLASTY WITH STENT PLACEMENT     Social History:  reports that he has been smoking.  He uses smokeless tobacco. He reports that he does not drink alcohol or use drugs.  Allergies  Allergen Reactions  . Codeine Other (See Comments)    Pancreatic problems   . Dilaudid [Hydromorphone Hcl] Other (See Comments)    Affects patient's thinking  . Morphine And Related Other (See Comments)    Affects patient's thinking     Family History  Problem Relation Age of Onset  . Diabetes Mother   . Heart attack Father   . Heart attack Brother     x 2 brothers     Prior to Admission medications   Medication Sig Start Date End Date Taking? Authorizing Provider  Ascorbic Acid (VITAMIN C) 1000 MG tablet Take 1,000 mg by mouth daily.   Yes Historical Provider, MD  aspirin 325 MG tablet Take 325 mg by mouth daily.   Yes Historical Provider, MD  atorvastatin (LIPITOR) 40 MG tablet take 1/2 tablet by mouth once daily Patient taking differently: take 1 tablet by mouth once daily at bedtime 02/15/16  Yes Jerline Pain, MD  b complex vitamins tablet Take 1 tablet by  mouth daily.   Yes Historical Provider, MD  Cholecalciferol (VITAMIN D PO) Take 1 tablet by mouth daily.   Yes Historical Provider, MD  clonazePAM (KLONOPIN) 0.5 MG tablet Take 0.5 mg by mouth at bedtime.   Yes Historical Provider, MD  CREON 36000 units CPEP capsule Take 36,000 Units by mouth 3 (three) times daily. 03/20/16  Yes Historical Provider, MD  docusate sodium 100 MG CAPS Take 200 mg by mouth 2 (two) times daily. Patient taking differently: Take 400 mg by mouth daily with supper.  10/30/11  Yes Lisette Abu, PA-C  escitalopram (LEXAPRO) 10 MG tablet Take 10 mg by mouth daily.   Yes Historical Provider, MD  gabapentin (NEURONTIN) 100 MG capsule Take 100 mg by  mouth 2 (two) times daily. 01/25/16  Yes Historical Provider, MD  labetalol (NORMODYNE) 100 MG tablet Take 1 tablet (100 mg total) by mouth 2 (two) times daily. Patient taking differently: Take 50 mg by mouth 2 (two) times daily.  02/23/16  Yes Domenic Polite, MD  levothyroxine (SYNTHROID, LEVOTHROID) 50 MCG tablet Take 50 mcg by mouth daily before breakfast.    Yes Historical Provider, MD  OXYCONTIN 20 MG 12 hr tablet Take 20 mg by mouth every morning.  01/25/16  Yes Historical Provider, MD  OXYCONTIN 30 MG T12A Take 30 mg by mouth every evening.  07/22/14  Yes Historical Provider, MD  vitamin E 100 UNIT capsule Take 100 Units by mouth daily.   Yes Historical Provider, MD    Physical Exam: Vitals:   03/23/16 1601 03/23/16 1602 03/23/16 1603 03/23/16 1730  BP: 159/79   177/80  Pulse: 78 75 74 77  Resp:  13 14 18   Temp:      TempSrc:      SpO2: 98% 98% 98% 98%  Weight:      Height:        General: Alert, Awake and Oriented to Time, Place and Person. Appear in moderate distress, affect appropriate Eyes: PERRL, Conjunctiva normal ENT: Oral Mucosa clear dry. Neck: no JVD, no Abnormal Mass Or lumps Cardiovascular: S1 and S2 Present, no Murmur, Peripheral Pulses Present Respiratory: Bilateral Air entry equal and Decreased, no use of accessory muscle, Clear to Auscultation, no Crackles, no wheezes Abdomen: Bowel Sound present, Soft and mild tenderness Skin: no redness, no Rash, no induration Extremities: no Pedal edema, no calf tenderness Neurologic: Grossly no focal neuro deficit. Bilaterally Equal motor strength  Labs on Admission:  CBC:  Recent Labs Lab 03/23/16 1143  WBC 8.0  HGB 12.1*  HCT 36.0*  MCV 96.3  PLT 123XX123   Basic Metabolic Panel:  Recent Labs Lab 03/23/16 1143  NA 138  K 3.9  CL 103  CO2 25  GLUCOSE 142*  BUN 40*  CREATININE 1.91*  CALCIUM 9.7   GFR: Estimated Creatinine Clearance: 27.8 mL/min (by C-G formula based on SCr of 1.91 mg/dL (H)). Liver Function  Tests:  Recent Labs Lab 03/23/16 1143  AST 27  ALT 16*  ALKPHOS 80  BILITOT 0.9  PROT 6.7  ALBUMIN 3.9    Recent Labs Lab 03/23/16 1143  LIPASE 22   No results for input(s): AMMONIA in the last 168 hours. Coagulation Profile: No results for input(s): INR, PROTIME in the last 168 hours. Cardiac Enzymes: No results for input(s): CKTOTAL, CKMB, CKMBINDEX, TROPONINI in the last 168 hours. BNP (last 3 results) No results for input(s): PROBNP in the last 8760 hours. HbA1C: No results for input(s): HGBA1C in the last 72  hours. CBG: No results for input(s): GLUCAP in the last 168 hours. Lipid Profile: No results for input(s): CHOL, HDL, LDLCALC, TRIG, CHOLHDL, LDLDIRECT in the last 72 hours. Thyroid Function Tests: No results for input(s): TSH, T4TOTAL, FREET4, T3FREE, THYROIDAB in the last 72 hours. Anemia Panel: No results for input(s): VITAMINB12, FOLATE, FERRITIN, TIBC, IRON, RETICCTPCT in the last 72 hours. Urine analysis:    Component Value Date/Time   COLORURINE YELLOW 04/23/2014 2050   [place] 04/23/2014 2050   LABSPEC 1.010 04/23/2014 2050   PHURINE 5.0 04/23/2014 2050   GLUCOSEU NEGATIVE 04/23/2014 2050   HGBUR SMALL (A) 04/23/2014 2050   [place] NEGATIVE 04/23/2014 2050   KETONESUR NEGATIVE 04/23/2014 2050   PROTEINUR NEGATIVE 04/23/2014 2050   UROBILINOGEN 0.2 04/23/2014 2050   NITRITE NEGATIVE 04/23/2014 2050   LEUKOCYTESUR NEGATIVE 04/23/2014 2050    Radiological Exams on Admission: Ct Abdomen Pelvis Wo Contrast  Result Date: 03/23/2016 CLINICAL DATA:  Acute generalized abdominal pain. EXAM: CT ABDOMEN AND PELVIS WITHOUT CONTRAST TECHNIQUE: Multidetector CT imaging of the abdomen and pelvis was performed following the standard protocol without IV contrast. COMPARISON:  CT scan of March 22, 2016. FINDINGS: Lower chest: No acute abnormality. Hepatobiliary: No focal liver abnormality is seen. No gallstones, gallbladder wall thickening, or biliary  dilatation. Pancreas: Pancreas appears to be thin and atrophic. Inflammatory changes are noted around the pancreatic head suggesting possible pancreatitis. Spleen: Normal in size without focal abnormality. Adrenals/Urinary Tract: Adrenal glands are unremarkable. Kidneys are normal, without renal calculi, focal lesion, or hydronephrosis. Bladder is unremarkable. Stomach/Bowel: Stomach is within normal limits. Appendix appears normal. No evidence of bowel wall thickening, distention, or inflammatory changes. Vascular/Lymphatic: 3.6 cm infrarenal abdominal aortic aneurysm is noted. Atherosclerosis is noted. No significant adenopathy is noted. Reproductive: Prostate is unremarkable. Other: No abdominal wall hernia or abnormality. No abdominopelvic ascites. Musculoskeletal: No acute or significant osseous findings. IMPRESSION: 3.6 cm infrarenal abdominal aortic aneurysm. Recommend followup by ultrasound in 2 years. This recommendation follows ACR consensus guidelines: White Paper of the ACR Incidental Findings Committee II on Vascular Findings. J Am Coll Radiol 2013; 10:789-794. Possible inflammatory changes are noted around the pancreatic head which may represent pancreatitis. Clinical correlation is recommended. Electronically Signed   By: Marijo Conception, M.D.   On: 03/23/2016 16:04   Dg Chest 2 View  Result Date: 03/23/2016 CLINICAL DATA:  Chest pain EXAM: CHEST  2 VIEW COMPARISON:  None. FINDINGS: The heart size and mediastinal contours are within normal limits. Both lungs are clear. The visualized skeletal structures are unremarkable. IMPRESSION: No active cardiopulmonary disease. Electronically Signed   By: Kathreen Devoid   On: 03/23/2016 13:00   Ct Angio Abdomen Pelvis  W &/or Wo Contrast  Result Date: 03/22/2016 CLINICAL DATA:  Abdominal aortic aneurysm EXAM: CT ANGIOGRAPHY ABDOMEN AND PELVIS TECHNIQUE: Multidetector CT imaging of the abdomen and pelvis was performed using the standard protocol during bolus  administration of intravenous contrast. Multiplanar reconstructed images including MIPs were obtained and reviewed to evaluate the vascular anatomy. CONTRAST:  50 cc Isovue 370 COMPARISON:  04/27/2014, 02/19/2016 FINDINGS: Arterial findings: Aorta: Diffuse moderate atherosclerosis of the aorta. Infrarenal fusiform abdominal aortic aneurysm, maximal AP diameter 3.5 cm, transverse diameter 3 cm. This is slightly enlarged compared to 04/27/2014 measuring 3.0 x 2.8 cm, but stable compared to 02/21/2016. Celiac axis:          Widely patent origin and branches. Superior mesenteric:  Widely patent origin and branches. Left renal: Atherosclerotic origin but remains  patent. Small accessory left lower pole renal artery is also patent. Right renal: Atherosclerotic origin with mild luminal narrowing. Small accessory branch to the right kidney lower pole is also patent. Inferior mesenteric:  IMA remains patent off of the anterior aorta. Left iliac: Left common, internal and external iliac arteries are tortuous and atherosclerotic but patent. Right iliac: Right common, internal and external iliac arteries are also patent with mild tortuosity. Mild atherosclerotic changes evident. No inflow disease. Venous findings:      No significant finding. Review of the MIP images confirms the above findings. Nonvascular findings: No acute process in the lower chest. Remote granulomatous disease evident with left infrahilar calcified lymph nodes. No pericardial or pleural effusion. Native coronary atherosclerosis evident. No focal hepatic abnormality within the limits of arterial phase imaging. No biliary dilatation. Gallbladder is mildly distended. Slight biliary prominence. Appearance remains nonspecific and unchanged. Pancreas is thin and atrophic without significant ductal dilatation or focal abnormality. No surrounding inflammatory process or fluid collection. Normal arterial phase enhancement of the spleen. Adrenal glands, kidneys,  ureters and bladder are within normal limits for age. No renal obstruction or hydronephrosis. No obstructing ureteral calculus. Negative for bowel obstruction, significant dilatation, ileus, or free air. No fluid collection or abscess. Appendix is unremarkable. No adenopathy Prostate gland is normal in size. No inguinal abnormality or hernia.  Intact abdominal wall. Healed deformity of the left iliac crest. No acute osseous finding. No acute compression fracture. IMPRESSION: 3.5 cm infrarenal atherosclerotic aneurysm, slightly larger than 04/27/2014 but stable compared to 02/21/2016. Otherwise stable CT exam of the abdomen and pelvis. Electronically Signed   By: Jerilynn Mages.  Shick M.D.   On: 03/22/2016 17:02   EKG: Independently reviewed. nonspecific ST and T waves changes.  Assessment/Plan 1. Acute on chronic kidney failure (HCC) Dehydration from poor oral intake. Continue IV hydration. Recheck renal function in the morning. Avoid the nephrotoxic medication. Baseline chronic kidney disease stage III.  2. Chronic pancreatitis with chronic abdominal pain. Mild acute flareup. Patient will remain nothing by mouth overnight. Resume diet in the morning once the pain is improving. Add when necessary OxyIR to his regimen for pain control. Next and patient unable to tolerate any IV medication. Continue home OxyContin. Continue Creon. Discontinue Lipitor.  3. Accelerated hypertension. Blood pressure significantly elevated. Add Imdur..  4. AAA. Recent admission for questionable dissecting aneurysm. Next on CT scan of the abdomen today does not show any evidence of dissection and shows AAA. Not ruptured. monitor. Outpatient follow-up with vascular surgery.  5. chronic anemia. H&H relatively stable. We'll continue to monitor.  Nutrition: Nothing by mouth except medications  DVT Prophylaxis: subcutaneous Heparin  Advance goals of care discussion: DNR/DNI   Consults: None  Family Communication:  family was present at bedside, at the time of interview.  Opportunity was given to ask question and all questions were answered satisfactorily.  Disposition: Admitted as observation, telemetry unit. Likely to be discharged home, in 1-2 days pending improvement in abdominal pain.  Author: Berle Mull, MD [facility] Pager: [phone] 03/23/2016  If 7PM-7AM, please contact night-coverage [email or web address] Password [email or web address]

## 2016-03-23 NOTE — ED Notes (Signed)
Called 3x to re-check vitals; no response

## 2016-03-24 DIAGNOSIS — N183 Chronic kidney disease, stage 3 (moderate): Secondary | ICD-10-CM | POA: Diagnosis not present

## 2016-03-24 DIAGNOSIS — N179 Acute kidney failure, unspecified: Secondary | ICD-10-CM | POA: Diagnosis not present

## 2016-03-24 LAB — COMPREHENSIVE METABOLIC PANEL
ALT: 14 U/L — AB (ref 17–63)
AST: 22 U/L (ref 15–41)
Albumin: 3.2 g/dL — ABNORMAL LOW (ref 3.5–5.0)
Alkaline Phosphatase: 70 U/L (ref 38–126)
Anion gap: 7 (ref 5–15)
BILIRUBIN TOTAL: 0.8 mg/dL (ref 0.3–1.2)
BUN: 31 mg/dL — AB (ref 6–20)
CALCIUM: 9.2 mg/dL (ref 8.9–10.3)
CO2: 26 mmol/L (ref 22–32)
CREATININE: 1.75 mg/dL — AB (ref 0.61–1.24)
Chloride: 107 mmol/L (ref 101–111)
GFR calc Af Amer: 40 mL/min — ABNORMAL LOW (ref >60)
GFR, EST NON AFRICAN AMERICAN: 34 mL/min — AB (ref >60)
Glucose, Bld: 93 mg/dL (ref 65–99)
Potassium: 5 mmol/L (ref 3.5–5.1)
Sodium: 140 mmol/L (ref 135–145)
TOTAL PROTEIN: 5.6 g/dL — AB (ref 6.5–8.1)

## 2016-03-24 LAB — CBC
HCT: 31.3 % — ABNORMAL LOW (ref 39.0–52.0)
Hemoglobin: 10.3 g/dL — ABNORMAL LOW (ref 13.0–17.0)
MCH: 31.6 pg (ref 26.0–34.0)
MCHC: 32.9 g/dL (ref 30.0–36.0)
MCV: 96 fL (ref 78.0–100.0)
PLATELETS: 179 10*3/uL (ref 150–400)
RBC: 3.26 MIL/uL — ABNORMAL LOW (ref 4.22–5.81)
RDW: 12.8 % (ref 11.5–15.5)
WBC: 7.5 10*3/uL (ref 4.0–10.5)

## 2016-03-24 LAB — MAGNESIUM: Magnesium: 2.2 mg/dL (ref 1.7–2.4)

## 2016-03-24 LAB — GLUCOSE, CAPILLARY: GLUCOSE-CAPILLARY: 91 mg/dL (ref 65–99)

## 2016-03-24 NOTE — Progress Notes (Signed)
[facility] Progress Note  [name] [id number]   PCP: Jani Gravel, MD DOB: 16-Apr-1930   DOA: 03/23/2016   DOS: 03/24/2016   Date of Service: the patient was seen and examined on 03/24/2016   Subjective: Feeling better with improvement in abdominal pain while tolerating clear liquid diet. No nausea no vomiting.  Brief hospital course: Pt. with PMH of chronic pancreatitis, chronic kidney disease stage III, chronic anemia, HTN, AAA; admitted on 03/23/2016, with complaint of abdominal pain, was found to have acute flareup of chronic pancreatitis, acute on chronic kidney disease and accelerated hypertension. Currently further plan is continue pain control and advance diet..  Assessment and Plan: 1. Acute on chronic kidney failure (HCC) Dehydration from poor oral intake. Continue IV hydration. Renal function better, continue hydration. Avoid the nephrotoxic medication. Baseline chronic kidney disease stage III.  2. Chronic pancreatitis with chronic abdominal pain. Mild acute flareup. Getting better, continue IV hydration, advance to full liquid diet and if tolerated to soft diet tomorrow. Add when necessary OxyIR to his regimen for pain control. Next and patient unable to tolerate any IV medication. Continue home OxyContin. Continue Creon. Discontinue Lipitor.  3. Accelerated hypertension. Blood pressure significantly elevated. Add Imdur.  4. AAA. Recent admission for questionable dissecting aneurysm.  CT scan of the abdomen this admission does not show any evidence of dissection and shows AAA. Not ruptured. monitor. Outpatient follow-up with vascular surgery.  5. chronic anemia. H&H relatively stable. We'll continue to monitor.  Bowel regimen: last BM 03/23/2016 Diet: Full liquid diet, advance to soft diet tomorrow DVT Prophylaxis: subcutaneous Heparin  Advance goals of care discussion: DNR/DNI  Family Communication: family was present at bedside, at the  time of interview. The pt provided permission to discuss medical plan with the family. Opportunity was given to ask question and all questions were answered satisfactorily.   Disposition:  Discharge to home. Expected discharge date: 03/25/2016,   Consultants: none Procedures: none  Antibiotics: Anti-infectives    None        Objective: Physical Exam: Vitals:   03/23/16 1900 03/23/16 2047 03/24/16 0417 03/24/16 0845  BP: 148/74 (!) 170/83 116/76 107/71  Pulse: 76 80 88 84  Resp: 18 18 18 18   Temp:  99 F (37.2 C) 98.5 F (36.9 C) 98.4 F (36.9 C)  TempSrc:  Oral Oral Oral  SpO2: 96% 94% 93% 95%  Weight:  65.1 kg (143 lb 8 oz)    Height:  5\' 11"  (1.803 m)      Intake/Output Summary (Last 24 hours) at 03/24/16 1604 Last data filed at 03/24/16 1300  Gross per 24 hour  Intake             2410 ml  Output              800 ml  Net             1610 ml   Filed Weights   03/23/16 1132 03/23/16 2047  Weight: 67.1 kg (148 lb) 65.1 kg (143 lb 8 oz)    General: Alert, Awake and Oriented to Time, Place and Person. Appear in mild distress, affect appropriate Eyes: PERRL, Conjunctiva normal ENT: Oral Mucosa clear moist. Neck: no JVD, no Abnormal Mass Or lumps Cardiovascular: S1 and S2 Present, no Murmur, Respiratory: Bilateral Air entry equal and Decreased, no use of accessory muscle, Clear to Auscultation, no Crackles, no wheezes Abdomen: Bowel Sound present, Soft and mild tenderness Skin: no redness, no Rash, no induration Extremities:  no Pedal edema, no calf tenderness Neurologic: Grossly no focal neuro deficit. Bilaterally Equal motor strength  Data Reviewed: CBC:  Recent Labs Lab 03/23/16 1143 03/24/16 0551  WBC 8.0 7.5  HGB 12.1* 10.3*  HCT 36.0* 31.3*  MCV 96.3 96.0  PLT 185 0000000   Basic Metabolic Panel:  Recent Labs Lab 03/23/16 1143 03/24/16 0551  NA 138 140  K 3.9 5.0  CL 103 107  CO2 25 26  GLUCOSE 142* 93  BUN 40* 31*  CREATININE 1.91* 1.75*    CALCIUM 9.7 9.2  MG  --  2.2    Liver Function Tests:  Recent Labs Lab 03/23/16 1143 03/24/16 0551  AST 27 22  ALT 16* 14*  ALKPHOS 80 70  BILITOT 0.9 0.8  PROT 6.7 5.6*  ALBUMIN 3.9 3.2*    Recent Labs Lab 03/23/16 1143  LIPASE 22   No results for input(s): AMMONIA in the last 168 hours. Coagulation Profile: No results for input(s): INR, PROTIME in the last 168 hours. Cardiac Enzymes: No results for input(s): CKTOTAL, CKMB, CKMBINDEX, TROPONINI in the last 168 hours. BNP (last 3 results) No results for input(s): PROBNP in the last 8760 hours.  CBG:  Recent Labs Lab 03/24/16 0819  GLUCAP 91    Studies: No results found.   Scheduled Meds: . aspirin EC  81 mg Oral Daily  . clonazePAM  0.5 mg Oral QHS  . docusate sodium  100 mg Oral BID  . escitalopram  10 mg Oral q1800  . gabapentin  100 mg Oral TID  . heparin  5,000 Units Subcutaneous Q8H  . isosorbide mononitrate  30 mg Oral Daily  . levothyroxine  50 mcg Oral QAC breakfast  . lipase/protease/amylase  36,000 Units Oral TID WC  . oxyCODONE  20 mg Oral BH-[id number]  . oxyCODONE  30 mg Oral QPM  . polyethylene glycol  17 g Oral Daily  . sodium chloride flush  3 mL Intravenous Q12H   Continuous Infusions: . sodium chloride 100 mL/hr at 03/24/16 0800   PRN Meds: acetaminophen **OR** acetaminophen, hydrALAZINE, ondansetron **OR** ondansetron (ZOFRAN) IV, oxyCODONE  Time spent: 30 minutes  Author: Berle Mull, MD [facility] Pager: [phone] 03/24/2016 4:04 PM  If 7PM-7AM, please contact night-coverage at [email or web address], password [email or web address]

## 2016-03-25 DIAGNOSIS — I129 Hypertensive chronic kidney disease with stage 1 through stage 4 chronic kidney disease, or unspecified chronic kidney disease: Secondary | ICD-10-CM | POA: Diagnosis present

## 2016-03-25 DIAGNOSIS — Z7982 Long term (current) use of aspirin: Secondary | ICD-10-CM | POA: Diagnosis not present

## 2016-03-25 DIAGNOSIS — E860 Dehydration: Secondary | ICD-10-CM | POA: Diagnosis present

## 2016-03-25 DIAGNOSIS — G9340 Encephalopathy, unspecified: Secondary | ICD-10-CM | POA: Diagnosis present

## 2016-03-25 DIAGNOSIS — Z8249 Family history of ischemic heart disease and other diseases of the circulatory system: Secondary | ICD-10-CM | POA: Diagnosis not present

## 2016-03-25 DIAGNOSIS — K861 Other chronic pancreatitis: Secondary | ICD-10-CM

## 2016-03-25 DIAGNOSIS — Z833 Family history of diabetes mellitus: Secondary | ICD-10-CM | POA: Diagnosis not present

## 2016-03-25 DIAGNOSIS — G8929 Other chronic pain: Secondary | ICD-10-CM | POA: Diagnosis present

## 2016-03-25 DIAGNOSIS — K859 Acute pancreatitis without necrosis or infection, unspecified: Secondary | ICD-10-CM | POA: Diagnosis present

## 2016-03-25 DIAGNOSIS — Z66 Do not resuscitate: Secondary | ICD-10-CM | POA: Diagnosis present

## 2016-03-25 DIAGNOSIS — D649 Anemia, unspecified: Secondary | ICD-10-CM | POA: Diagnosis present

## 2016-03-25 DIAGNOSIS — K222 Esophageal obstruction: Secondary | ICD-10-CM | POA: Diagnosis present

## 2016-03-25 DIAGNOSIS — Z79899 Other long term (current) drug therapy: Secondary | ICD-10-CM | POA: Diagnosis not present

## 2016-03-25 DIAGNOSIS — R52 Pain, unspecified: Secondary | ICD-10-CM | POA: Diagnosis present

## 2016-03-25 DIAGNOSIS — G9349 Other encephalopathy: Secondary | ICD-10-CM | POA: Diagnosis not present

## 2016-03-25 DIAGNOSIS — Z885 Allergy status to narcotic agent status: Secondary | ICD-10-CM | POA: Diagnosis not present

## 2016-03-25 DIAGNOSIS — I2510 Atherosclerotic heart disease of native coronary artery without angina pectoris: Secondary | ICD-10-CM | POA: Diagnosis present

## 2016-03-25 DIAGNOSIS — K219 Gastro-esophageal reflux disease without esophagitis: Secondary | ICD-10-CM | POA: Diagnosis present

## 2016-03-25 DIAGNOSIS — E1122 Type 2 diabetes mellitus with diabetic chronic kidney disease: Secondary | ICD-10-CM | POA: Diagnosis present

## 2016-03-25 DIAGNOSIS — N183 Chronic kidney disease, stage 3 (moderate): Secondary | ICD-10-CM | POA: Diagnosis not present

## 2016-03-25 DIAGNOSIS — Z79891 Long term (current) use of opiate analgesic: Secondary | ICD-10-CM | POA: Diagnosis not present

## 2016-03-25 DIAGNOSIS — F329 Major depressive disorder, single episode, unspecified: Secondary | ICD-10-CM | POA: Diagnosis present

## 2016-03-25 DIAGNOSIS — R451 Restlessness and agitation: Secondary | ICD-10-CM | POA: Diagnosis not present

## 2016-03-25 DIAGNOSIS — I714 Abdominal aortic aneurysm, without rupture: Secondary | ICD-10-CM | POA: Diagnosis present

## 2016-03-25 DIAGNOSIS — Z781 Physical restraint status: Secondary | ICD-10-CM | POA: Diagnosis not present

## 2016-03-25 DIAGNOSIS — N179 Acute kidney failure, unspecified: Secondary | ICD-10-CM | POA: Diagnosis not present

## 2016-03-25 MED ORDER — LORAZEPAM 2 MG/ML IJ SOLN
INTRAMUSCULAR | Status: AC
  Filled 2016-03-25: qty 1

## 2016-03-25 MED ORDER — QUETIAPINE FUMARATE 25 MG PO TABS: 25.0000 mg | ORAL_TABLET | Freq: Every day | ORAL | Status: DC

## 2016-03-25 MED ORDER — WHITE PETROLATUM GEL
Status: AC
  Administered 2016-03-25: 18:00:00
  Filled 2016-03-25: qty 1

## 2016-03-25 MED ORDER — MIRTAZAPINE 15 MG PO TABS
15.0000 mg | ORAL_TABLET | Freq: Every day | ORAL | Status: DC
  Administered 2016-03-25: 15 mg via ORAL
  Filled 2016-03-25: qty 1

## 2016-03-25 MED ORDER — LORAZEPAM 2 MG/ML IJ SOLN: 1.0000 mg | Freq: Once | INTRAMUSCULAR | Status: DC

## 2016-03-25 MED ORDER — HALOPERIDOL LACTATE 5 MG/ML IJ SOLN
5.0000 mg | Freq: Once | INTRAMUSCULAR | Status: AC
  Administered 2016-03-25: 5 mg via INTRAMUSCULAR
  Filled 2016-03-25: qty 1

## 2016-03-25 MED ORDER — QUETIAPINE FUMARATE 25 MG PO TABS
25.0000 mg | ORAL_TABLET | Freq: Every day | ORAL | Status: DC
  Administered 2016-03-25: 25 mg via ORAL
  Filled 2016-03-25: qty 1

## 2016-03-25 MED ORDER — AMLODIPINE BESYLATE 5 MG PO TABS
5.0000 mg | ORAL_TABLET | Freq: Every day | ORAL | Status: DC
  Administered 2016-03-25 – 2016-03-26 (×2): 5 mg via ORAL
  Filled 2016-03-25 (×2): qty 1

## 2016-03-25 MED ORDER — POLYETHYLENE GLYCOL 3350 17 G PO PACK
17.0000 g | PACK | Freq: Two times a day (BID) | ORAL | Status: DC
  Administered 2016-03-25 – 2016-03-26 (×2): 17 g via ORAL
  Filled 2016-03-25 (×2): qty 1

## 2016-03-25 MED ORDER — BOOST / RESOURCE BREEZE PO LIQD
1.0000 | Freq: Three times a day (TID) | ORAL | Status: DC
  Administered 2016-03-25 (×2): 1 via ORAL

## 2016-03-25 NOTE — Progress Notes (Signed)
[facility] Progress Note  [name] [id number]   PCP: Jani Gravel, MD DOB: 07-Jan-1931   DOA: 03/23/2016   DOS: 03/25/2016   Date of Service: the patient was seen and examined on 03/25/2016   Subjective: Feeling better sleepy and lethargic, last night was agitated and confused.   Brief hospital course: Pt. with PMH of chronic pancreatitis, chronic kidney disease stage III, chronic anemia, HTN, AAA; admitted on 03/23/2016, with complaint of abdominal pain, was found to have acute flareup of chronic pancreatitis, acute on chronic kidney disease and accelerated hypertension. Currently further plan is continue pain control and advance diet and control agitation.   Assessment and Plan: 1. Acute on chronic kidney failure (HCC) Dehydration from poor oral intake. Continue IV hydration. Renal function better, continue hydration. Avoid the nephrotoxic medication. Baseline chronic kidney disease stage III.  2. Chronic pancreatitis with chronic abdominal pain. Mild acute flareup. Pain Getting better, but oral intake minimum Continue IV hydration, advance soft diet.  Add when necessary OxyIR to his regimen for pain control.  Patient unable to tolerate any IV medication. Continue home OxyContin. Continue Creon. Discontinue Lipitor.  3. Accelerated hypertension. Blood pressure significantly elevated. Add Imdur and norvasc  4. acute encephalopathy  Poor oral intake Pt was confused last night and agitated needing haldol and restrain overnight. Restrains are off this AM and lethargic but not agitated Minimal oral intake at present. Primary reason for pt's admission is poor intake.  At present I am adding Seroquel for agitation, Remeron instead of Klonopin for sleep and appetite stimulation. Continue stool softener, IV fluids  5. chronic anemia. H&H relatively stable. We'll continue to monitor.  6. AAA. Recent admission for questionable dissecting aneurysm.  CT scan of  the abdomen this admission does not show any evidence of dissection and shows AAA. Not ruptured. monitor. Outpatient follow-up with vascular surgery.  Bowel regimen: last BM 03/24/2016 Diet: soft diet DVT Prophylaxis: subcutaneous Heparin  Advance goals of care discussion: DNR/DNI, if no PO intake and no improvement in mentation with above management then will need to discuss further.  Family Communication: family was present at bedside, at the time of interview. The pt provided permission to discuss medical plan with the family. Opportunity was given to ask question and all questions were answered satisfactorily.   Disposition:  Discharge to home. Expected discharge date: 03/26/2016,   Consultants: none Procedures: none  Antibiotics: Anti-infectives    None        Objective: Physical Exam: Vitals:   03/24/16 0845 03/24/16 1720 03/25/16 0524 03/25/16 0836  BP: 107/71 100/66 (!) 156/73 (!) 176/75  Pulse: 84 87 70 82  Resp: 18 18 18 18   Temp: 98.4 F (36.9 C) 98.6 F (37 C) 99.9 F (37.7 C) 98.2 F (36.8 C)  TempSrc: Oral Oral Oral Oral  SpO2: 95% 94% 95% 96%  Weight:   67.4 kg (148 lb 9.6 oz)   Height:        Intake/Output Summary (Last 24 hours) at 03/25/16 1447 Last data filed at 03/25/16 1000  Gross per 24 hour  Intake              570 ml  Output             2700 ml  Net            -2130 ml   Filed Weights   03/23/16 1132 03/23/16 2047 03/25/16 0524  Weight: 67.1 kg (148 lb) 65.1 kg (143 lb 8 oz)  67.4 kg (148 lb 9.6 oz)    General: Alert, Awake and Oriented to Time and Person. Appear in mild distress, affect appropriate Eyes: PERRL, Conjunctiva normal ENT: Oral Mucosa clear moist. Neck: no JVD, no Abnormal Mass Or lumps Cardiovascular: S1 and S2 Present, no Murmur, Respiratory: Bilateral Air entry equal and Decreased, no use of accessory muscle, Clear to Auscultation, no Crackles, no wheezes Abdomen: Bowel Sound present, Soft and mild tenderness Skin:  no redness, no Rash, no induration Extremities: no Pedal edema, no calf tenderness Neurologic: Grossly no focal neuro deficit. Bilaterally Equal motor strength  Data Reviewed: CBC:  Recent Labs Lab 03/23/16 1143 03/24/16 0551  WBC 8.0 7.5  HGB 12.1* 10.3*  HCT 36.0* 31.3*  MCV 96.3 96.0  PLT 185 0000000   Basic Metabolic Panel:  Recent Labs Lab 03/23/16 1143 03/24/16 0551  NA 138 140  K 3.9 5.0  CL 103 107  CO2 25 26  GLUCOSE 142* 93  BUN 40* 31*  CREATININE 1.91* 1.75*  CALCIUM 9.7 9.2  MG  --  2.2    Liver Function Tests:  Recent Labs Lab 03/23/16 1143 03/24/16 0551  AST 27 22  ALT 16* 14*  ALKPHOS 80 70  BILITOT 0.9 0.8  PROT 6.7 5.6*  ALBUMIN 3.9 3.2*    Recent Labs Lab 03/23/16 1143  LIPASE 22   No results for input(s): AMMONIA in the last 168 hours. Coagulation Profile: No results for input(s): INR, PROTIME in the last 168 hours. Cardiac Enzymes: No results for input(s): CKTOTAL, CKMB, CKMBINDEX, TROPONINI in the last 168 hours. BNP (last 3 results) No results for input(s): PROBNP in the last 8760 hours.  CBG:  Recent Labs Lab 03/24/16 0819  GLUCAP 91    Studies: No results found.   Scheduled Meds: . amLODipine  5 mg Oral Daily  . aspirin EC  81 mg Oral Daily  . docusate sodium  100 mg Oral BID  . escitalopram  10 mg Oral q1800  . feeding supplement  1 Container Oral TID BM  . gabapentin  100 mg Oral TID  . heparin  5,000 Units Subcutaneous Q8H  . isosorbide mononitrate  30 mg Oral Daily  . levothyroxine  50 mcg Oral QAC breakfast  . lipase/protease/amylase  36,000 Units Oral TID WC  . LORazepam      . mirtazapine  15 mg Oral QHS  . oxyCODONE  20 mg Oral BH-q7a  . oxyCODONE  30 mg Oral QPM  . polyethylene glycol  17 g Oral BID  . QUEtiapine  25 mg Oral QHS  . sodium chloride flush  3 mL Intravenous Q12H  . white petrolatum       Continuous Infusions: . sodium chloride 100 mL/hr at 03/24/16 1759   PRN Meds: acetaminophen  **OR** acetaminophen, hydrALAZINE, ondansetron **OR** ondansetron (ZOFRAN) IV, oxyCODONE  Time spent: 30 minutes  Author: Berle Mull, MD [facility] Pager: [phone] 03/25/2016 2:47 PM  If 7PM-7AM, please contact night-coverage at [email or web address], password [facility]

## 2016-03-25 NOTE — Progress Notes (Signed)
Patient confuse, agitated, tried to get out in the bed, and tried to hit staff. M.D. Notied. And ordered haldol 5mg  IM and waist restraints. Meds given , Restraints applied. Will continue to monitor.

## 2016-03-26 LAB — CBC
HCT: 29.3 % — ABNORMAL LOW (ref 39.0–52.0)
Hemoglobin: 9.7 g/dL — ABNORMAL LOW (ref 13.0–17.0)
MCH: 31.6 pg (ref 26.0–34.0)
MCHC: 33.1 g/dL (ref 30.0–36.0)
MCV: 95.4 fL (ref 78.0–100.0)
PLATELETS: 139 10*3/uL — AB (ref 150–400)
RBC: 3.07 MIL/uL — ABNORMAL LOW (ref 4.22–5.81)
RDW: 12.7 % (ref 11.5–15.5)
WBC: 6 10*3/uL (ref 4.0–10.5)

## 2016-03-26 LAB — MAGNESIUM: Magnesium: 2 mg/dL (ref 1.7–2.4)

## 2016-03-26 LAB — BASIC METABOLIC PANEL
Anion gap: 4 — ABNORMAL LOW (ref 5–15)
BUN: 17 mg/dL (ref 6–20)
CALCIUM: 8.8 mg/dL — AB (ref 8.9–10.3)
CO2: 28 mmol/L (ref 22–32)
Chloride: 109 mmol/L (ref 101–111)
Creatinine, Ser: 1.41 mg/dL — ABNORMAL HIGH (ref 0.61–1.24)
GFR calc Af Amer: 52 mL/min — ABNORMAL LOW (ref >60)
GFR, EST NON AFRICAN AMERICAN: 44 mL/min — AB (ref >60)
GLUCOSE: 89 mg/dL (ref 65–99)
Potassium: 3.6 mmol/L (ref 3.5–5.1)
SODIUM: 141 mmol/L (ref 135–145)

## 2016-03-26 MED ORDER — MIRTAZAPINE 15 MG PO TABS: 15.0000 mg | ORAL_TABLET | Freq: Every day | ORAL | 0 refills | Status: AC

## 2016-03-26 MED ORDER — ISOSORBIDE MONONITRATE ER 30 MG PO TB24: 30.0000 mg | ORAL_TABLET | Freq: Every day | ORAL | 0 refills | Status: DC

## 2016-03-26 MED ORDER — BOOST / RESOURCE BREEZE PO LIQD: 1.0000 | Freq: Three times a day (TID) | ORAL | 0 refills | Status: AC

## 2016-03-26 MED ORDER — QUETIAPINE FUMARATE 25 MG PO TABS: 25.0000 mg | ORAL_TABLET | Freq: Every day | ORAL | 0 refills | Status: AC

## 2016-03-26 MED ORDER — AMLODIPINE BESYLATE 5 MG PO TABS: 5.0000 mg | ORAL_TABLET | Freq: Every day | ORAL | 0 refills | Status: DC

## 2016-03-26 MED ORDER — OXYCODONE HCL 5 MG PO TABS: 5.0000 mg | ORAL_TABLET | Freq: Four times a day (QID) | ORAL | 0 refills | Status: DC | PRN

## 2016-03-26 MED ORDER — POLYETHYLENE GLYCOL 3350 17 G PO PACK: 17.0000 g | PACK | Freq: Every day | ORAL | 0 refills | Status: DC

## 2016-03-26 NOTE — Progress Notes (Signed)
Dorita Fray to be D/C'd Home per MD order.  Discussed prescriptions and follow up appointments with the patient. Prescriptions given to patient, medication list explained in detail. Pt verbalized understanding.  Allergies as of 03/26/2016      Reactions   Codeine Other (See Comments)   Pancreatic problems   Dilaudid [hydromorphone Hcl] Other (See Comments)   Affects patient's thinking   Morphine And Related Other (See Comments)   Affects patient's thinking      Medication List    STOP taking these medications   clonazePAM 0.5 MG tablet Commonly known as:  KLONOPIN   labetalol 100 MG tablet Commonly known as:  NORMODYNE     TAKE these medications   amLODipine 5 MG tablet Commonly known as:  NORVASC Take 1 tablet (5 mg total) by mouth daily.   aspirin 325 MG tablet Take 325 mg by mouth daily.   atorvastatin 40 MG tablet Commonly known as:  LIPITOR take 1/2 tablet by mouth once daily What changed:  See the new instructions.   b complex vitamins tablet Take 1 tablet by mouth daily.   CREON 36000 UNITS Cpep capsule Generic drug:  lipase/protease/amylase Take 36,000 Units by mouth 3 (three) times daily.   DSS 100 MG Caps Take 200 mg by mouth 2 (two) times daily. What changed:  how much to take  when to take this   escitalopram 10 MG tablet Commonly known as:  LEXAPRO Take 10 mg by mouth daily.   feeding supplement Liqd Take 1 Container by mouth 3 (three) times daily between meals.   gabapentin 100 MG capsule Commonly known as:  NEURONTIN Take 100 mg by mouth 2 (two) times daily.   levothyroxine 50 MCG tablet Commonly known as:  SYNTHROID, LEVOTHROID Take 50 mcg by mouth daily before breakfast.   mirtazapine 15 MG tablet Commonly known as:  REMERON Take 1 tablet (15 mg total) by mouth at bedtime.   OXYCONTIN 30 MG 12 hr tablet Generic drug:  oxyCODONE Take 30 mg by mouth every evening. What changed:  Another medication with the same name was added.  Make sure you understand how and when to take each.   OXYCONTIN 20 mg 12 hr tablet Generic drug:  oxyCODONE Take 20 mg by mouth every morning. What changed:  Another medication with the same name was added. Make sure you understand how and when to take each.   oxyCODONE 5 MG immediate release tablet Commonly known as:  Oxy IR/ROXICODONE Take 1 tablet (5 mg total) by mouth every 6 (six) hours as needed for moderate pain or severe pain. What changed:  You were already taking a medication with the same name, and this prescription was added. Make sure you understand how and when to take each.   polyethylene glycol packet Commonly known as:  MIRALAX / GLYCOLAX Take 17 g by mouth daily.   QUEtiapine 25 MG tablet Commonly known as:  SEROQUEL Take 1 tablet (25 mg total) by mouth at bedtime.   vitamin C 1000 MG tablet Take 1,000 mg by mouth daily.   VITAMIN D PO Take 1 tablet by mouth daily.   vitamin E 100 UNIT capsule Take 100 Units by mouth daily.       Vitals:   03/25/16 2107 03/26/16 0638  BP: (!) 175/75 (!) 136/47  Pulse: 70 65  Resp: 18 18  Temp: 98.6 F (37 C) 98.7 F (37.1 C)    Skin clean, dry and intact without evidence of skin break  down, no evidence of skin tears noted. IV catheter discontinued intact. Site without signs and symptoms of complications. Dressing and pressure applied. Pt denies pain at this time. No complaints noted.  An After Visit Summary was printed and given to the patient. Patient escorted via [place], and D/C home via private auto.  Haywood Lasso BSN, RN [facility] Phone [phone]

## 2016-03-26 NOTE — Care Management Note (Signed)
Case Management Note  Patient Details  Name: Paul Odonnell MRN: [id number] Date of Birth: 06/27/1930  Subjective/Objective:         CM following for progression and d/c planning.            Action/Plan: 03/26/2016 Met with pt and wife, who selected [facility] as preferred provider with pt BCBS Medicare. [facility] notified and will arrange [place] [facility] [place]. Will notify this CM if a copay is required.   Expected Discharge Date:  03/26/16               Expected Discharge Plan:  [place]  In-House Referral:  NA  Discharge planning Services  CM Consult  Post Acute Care Choice:  Home Health Choice offered to:  Spouse  DME Arranged:  N/A DME Agency:  NA  HH Arranged:  RN, PT [place] Agency:  [place] (now [facility])  Status of Service:  Completed, signed off  If discussed at [employer] of Stay Meetings, dates discussed:    Additional Comments:  Adron Bene, RN 03/26/2016, 11:29 AM

## 2016-03-26 NOTE — Progress Notes (Addendum)
Initial Nutrition Assessment  DOCUMENTATION CODES:   Not applicable  INTERVENTION:  Recommend continuation of nutritional supplements at home to aid in adequate nutrition.   Diet education given.  NUTRITION DIAGNOSIS:   Increased nutrient needs related to chronic illness as evidenced by estimated needs.  GOAL:   Patient will meet greater than or equal to 90% of their needs  MONITOR:   PO intake, Supplement acceptance, Labs, Weight trends, Skin, I & O's  REASON FOR ASSESSMENT:   Consult Assessment of nutrition requirement/status  ASSESSMENT:   83 year old male with PMH of chronic pancreatitis, chronic kidney disease stage III, chronic anemia, HTN, AAA; admitted on 03/23/2016, with complaint of abdominal pain, was found to have acute flareup of chronic pancreatitis, acute on chronic kidney disease and accelerated hypertension.  Pt reports appetite and po intake has improved and reports no abdominal pains. Meal completion has been varied 10-75%. Wife at bedside. She reports pt has been consuming 1-2 meals a day with snacks in between and Boost Protein shake at least twice daily. Weight has been stable. Pt currently has Boost Breeze ordered and has been consuming them. Wife at bedside request diet information on a diet regarding pancreatitis. Handout "Pancreatitis Nutrition Therapy" from the Academy of Nutrition and Dietetics Manual given. Foods recommended and not recommended were discussed the importance of adequate protein intake explained. Pt and wife expressed understanding of information. Plans for discharge home today.   Nutrition-Focused physical exam completed. Findings are no fat depletion, mild to moderate muscle depletion, and mild edema.   Labs and medications reviewed.   Diet Order:  DIET SOFT Room service appropriate? Yes; Fluid consistency: Thin DIET SOFT  Skin:  Wound (see comment) (Puncture wound R arm)  Last BM:  3/3  Height:   Ht Readings from Last 1  Encounters:  03/25/16 5\' 11"  (1.803 m)    Weight:   Wt Readings from Last 1 Encounters:  03/25/16 149 lb 9.6 oz (67.9 kg)    Ideal Body Weight:  78 kg  BMI:  Body mass index is 20.86 kg/m.  Estimated Nutritional Needs:   Kcal:  1850-2050  Protein:  90-100 grams  Fluid:  1.8-2L/day  EDUCATION NEEDS:   Education needs addressed  Corrin Parker, MS, RD, LDN Pager # [phone] After hours/ weekend pager # [phone]

## 2016-03-26 NOTE — Evaluation (Signed)
Physical Therapy Evaluation Patient Details Name: Paul Odonnell MRN: [id number] DOB: 04-24-1930 Today's Date: 03/26/2016   History of Present Illness  Patient is a 83 y/o male with  new onset of acute on chronic kidney failure, AAA, acute encephalopathy now referred to PT.  PMHx:  prostate CA, MI, CAD, DM, depression, sepsis, HTN, OA   Clinical Impression  Pt is demonstrating some proficiency with maneuvering walker, although his endurance has declined with gait.  He is appropriate for acute therapy to follow him before home to increase endurance, safety and balance awareness.  He is planned to go home with wife to follow with HHPT, and will have planning with case manager for his needs with services at home.      Follow Up Recommendations Home health PT;Supervision for mobility/OOB    Equipment Recommendations  None recommended by PT    Recommendations for Other Services       Precautions / Restrictions Precautions Precautions: Fall (telemetry) Restrictions Weight Bearing Restrictions: No      Mobility  Bed Mobility Overal bed mobility: Modified Independent             General bed mobility comments: extra time to get to side of bed  Transfers Overall transfer level: Needs assistance Equipment used: Rolling walker (2 wheeled);1 person hand held assist Transfers: Sit to/from [facility] Sit to Stand: Min guard Stand pivot transfers: Min guard       General transfer comment: Pt prefers to place both hands on the walker  Ambulation/Gait Ambulation/Gait assistance: Min guard (for safety) Ambulation Distance (Feet): 125 Feet Assistive device: Rolling walker (2 wheeled);1 person hand held assist Gait Pattern/deviations: Step-through pattern;Decreased stride length;Wide base of support;Trunk flexed Gait velocity: reduced Gait velocity interpretation: Below normal speed for age/gender General Gait Details: wider turns but pt in control of the  walker  Stairs            Wheelchair Mobility    Modified Rankin (Stroke Patients Only)       Balance Overall balance assessment: Needs assistance;History of Falls Sitting-balance support: Feet supported Sitting balance-Leahy Scale: Good   Postural control: Posterior lean Standing balance support: Bilateral upper extremity supported Standing balance-Leahy Scale: Fair                               Pertinent Vitals/Pain Pain Assessment: No/denies pain    Home Living Family/patient expects to be discharged to:: Private residence Living Arrangements: Spouse/significant other Available Help at Discharge: Family;Available 24 hours/day Type of Home: House Home Access: Stairs to enter Entrance Stairs-Rails: Right Entrance Stairs-Number of Steps: 2 Home Layout: One level Home Equipment: Walker - 2 wheels;Cane - single point;Shower seat;Wheelchair - manual;Bedside commode      Prior Function Level of Independence: Independent with assistive device(s) (wife there to assist if needed, AD changes based on his day)         Comments: falls but uses SPC or RW as needed     Hand Dominance   Dominant Hand: Right    Extremity/Trunk Assessment   Upper Extremity Assessment Upper Extremity Assessment: Overall WFL for tasks assessed    Lower Extremity Assessment Lower Extremity Assessment: Overall WFL for tasks assessed    Cervical / Trunk Assessment Cervical / Trunk Assessment: Normal  Communication   Communication: No difficulties  Cognition Arousal/Alertness: Awake/alert Behavior During Therapy: Impulsive Overall Cognitive Status: History of cognitive impairments - at baseline  General Comments General comments (skin integrity, edema, etc.): Pt is up to walk with PT using RW, noted good vitals with BP and pulse WFL, will need no AD at home as he is safe with RW    Exercises     Assessment/Plan    PT Assessment  Patient needs continued PT services  PT Problem List Decreased range of motion;Decreased activity tolerance;Decreased balance;Decreased mobility;Decreased coordination;Decreased knowledge of use of DME;Decreased safety awareness       PT Treatment Interventions DME instruction;Gait training;Functional mobility training;Stair training;Therapeutic activities;Therapeutic exercise;Balance training;Neuromuscular re-education;Patient/family education    PT Goals (Current goals can be found in the Care Plan section)  Acute Rehab PT Goals Patient Stated Goal: none stated PT Goal Formulation: With patient/family Time For Goal Achievement: 04/09/16 Potential to Achieve Goals: Good    Frequency Min 3X/week   Barriers to discharge Inaccessible home environment 3 steps with 2 rails    Co-evaluation               End of Session Equipment Utilized During Treatment: Gait belt Activity Tolerance: Patient tolerated treatment well;Patient limited by fatigue Patient left: in bed;with call bell/phone within reach;with bed alarm set;with family/visitor present Nurse Communication: Mobility status PT Visit Diagnosis: Unsteadiness on feet (R26.81)         Time: [id number] PT Time Calculation (min) (ACUTE ONLY): 30 min   Charges:   PT Evaluation $PT Eval Moderate Complexity: 1 Procedure PT Treatments $Gait Training: 8-22 mins   PT G Codes:         Ramond Dial 04-04-2016, 1:50 PM   Mee Hives, PT MS Acute Rehab Dept. Number: [place] and [place]

## 2016-03-27 NOTE — Discharge Summary (Signed)
[facility] Discharge Summary   [name] [id number]   PCP: Jani Gravel, MD DOB: 15-Apr-1930   Date of admission: 03/23/2016   Date of discharge: 03/26/2016    Discharge Diagnoses:  Principal Problem:   Acute on chronic kidney failure [facility]) Active Problems:   Chronic anemia   Essential hypertension   Chronic abdominal pain   AAA (abdominal aortic aneurysm) ([place])   Acute encephalopathy   Acute on chronic pancreatitis ([place])  Admitted From: home Disposition:  Home with home health  Recommendations for Outpatient Follow-up:  1. Follow-up with PCP in one week   Follow-up Information    Jani Gravel, MD. Schedule an appointment as soon as possible for a visit in 1 week.   Specialty:  Internal Medicine Why:  APPOINTMENT: Monday, 04-02-16 at 10:15am Contact information: [address] [place] [place] [address] [phone]          Diet recommendation: regular diet  Activity: The patient is advised to gradually reintroduce usual activities.  Discharge Condition: good  Code Status: DNR DNI  History of present illness: As per the H and P dictated on admission, " [name] is a 83 y.o. male with Past medical history of chronic pancreatitis, AAA, CAD, depression, GERD, HTN. Patient doesn't do with complaints of abdominal pain ongoing for last 3 days. Patient has not been able to eat anything for last 4 days as well. She denies any nausea or vomiting. He denies any diarrhea or constipation as well. He was seen by his PCP and they increased his Creon but that has not helped his pain. Denies any dizziness or lightheadedness. Denies any active bleeding anywhere. Denies any other changes in his medication."  Hospital Course:   Summary of his active problems in the hospital is as following. 1. Acute on chronic kidney failure (HCC) Dehydration from poor oral intake. Given IV hydration. Renal function better. Avoid the nephrotoxic medication. Baseline  chronic kidney disease stage III.  2.Chronic pancreatitis with chronic abdominal pain. Mild acute flareup. Pain Getting better,oral intake also improved Continue soft diet.  Add when necessary OxyIR to his regimen for pain control.  Continue home OxyContin. Continue Creon.  3.Accelerated hypertension. Blood pressure difficult to control Add norvasc, stop imdur and lisinopril in the setting of renal function  4.acute encephalopathy  Poor oral intake Pt was confused and agitated needing haldol and restrain overnight. At present I am adding Seroquel for agitation, Remeron instead of Klonopin for sleep and appetite stimulation. Continue stool softener  5.chronic anemia. H&H relatively stable. We'll continue to monitor.  6. AAA. Recent admission for questionable dissecting aneurysm.  CT scan of the abdomen this admission does not show any evidence of dissection and shows AAA. Not ruptured. monitor. Outpatient follow-up with vascular surgery.  All other chronic medical condition were stable during the hospitalization.  Patient was seen by physical therapy, who recommended home health, which was arranged by Education officer, museum and case Freight forwarder. On the day of the discharge the patient's vitals were stable, and no other acute medical condition were reported by patient. the patient was felt safe to be discharge at home with therapy.  Procedures and Results:  none   Consultations:  none  DISCHARGE MEDICATION: Discharge Medication List as of 03/26/2016  1:12 PM    START taking these medications   Details  amLODipine (NORVASC) 5 MG tablet Take 1 tablet (5 mg total) by mouth daily., Starting Mon 03/26/2016, Normal    feeding supplement (BOOST / RESOURCE BREEZE)  LIQD Take 1 Container by mouth 3 (three) times daily between meals., Starting Mon 03/26/2016, Normal    mirtazapine (REMERON) 15 MG tablet Take 1 tablet (15 mg total) by mouth at bedtime., Starting Mon 03/26/2016, Normal      oxyCODONE (OXY IR/ROXICODONE) 5 MG immediate release tablet Take 1 tablet (5 mg total) by mouth every 6 (six) hours as needed for moderate pain or severe pain., Starting Mon 03/26/2016, Print    polyethylene glycol (MIRALAX / GLYCOLAX) packet Take 17 g by mouth daily., Starting Mon 03/26/2016, Normal    QUEtiapine (SEROQUEL) 25 MG tablet Take 1 tablet (25 mg total) by mouth at bedtime., Starting Mon 03/26/2016, Normal      CONTINUE these medications which have NOT CHANGED   Details  Ascorbic Acid (VITAMIN C) 1000 MG tablet Take 1,000 mg by mouth daily., Historical Med    aspirin 325 MG tablet Take 325 mg by mouth daily., Historical Med    atorvastatin (LIPITOR) 40 MG tablet take 1/2 tablet by mouth once daily, Normal    b complex vitamins tablet Take 1 tablet by mouth daily., Historical Med    Cholecalciferol (VITAMIN D PO) Take 1 tablet by mouth daily., Historical Med    CREON 36000 units CPEP capsule Take 36,000 Units by mouth 3 (three) times daily., Starting Tue 03/20/2016, Historical Med    docusate sodium 100 MG CAPS Take 200 mg by mouth 2 (two) times daily., Starting 10/30/2011, Until Discontinued, No Print    escitalopram (LEXAPRO) 10 MG tablet Take 10 mg by mouth daily., Until Discontinued, Historical Med    gabapentin (NEURONTIN) 100 MG capsule Take 100 mg by mouth 2 (two) times daily., Starting Wed 01/25/2016, Historical Med    levothyroxine (SYNTHROID, LEVOTHROID) 50 MCG tablet Take 50 mcg by mouth daily before breakfast. , Historical Med    !! OXYCONTIN 20 MG 12 hr tablet Take 20 mg by mouth every morning. , Starting Wed 01/25/2016, Historical Med    !! OXYCONTIN 30 MG T12A Take 30 mg by mouth every evening. , Starting Thu 07/22/2014, Historical Med    vitamin E 100 UNIT capsule Take 100 Units by mouth daily., Historical Med     !! - Potential duplicate medications found. Please discuss with provider.    STOP taking these medications     clonazePAM (KLONOPIN) 0.5 MG tablet       isosorbide mononitrate (IMDUR) 30 MG 24 hr tablet      labetalol (NORMODYNE) 100 MG tablet        Allergies  Allergen Reactions  . Codeine Other (See Comments)    Pancreatic problems   . Dilaudid [Hydromorphone Hcl] Other (See Comments)    Affects patient's thinking  . Morphine And Related Other (See Comments)    Affects patient's thinking   Discharge Instructions    DIET SOFT    Complete by:  As directed    Discharge instructions    Complete by:  As directed    It is important that you read following instructions as well as go over your medication list with RN to help you understand your care after this hospitalization.  Discharge Instructions: Please follow-up with PCP in one week  Please request your primary care physician to go over all Hospital Tests and Procedure/Radiological results at the follow up,  Please get all Hospital records sent to your PCP by signing hospital release before you go home.   Do not drive, operating heavy machinery, perform activities at heights, swimming or  participation in water activities or provide baby sitting services while your are on Pain, Sleep and Anxiety Medications; until you have been seen by Primary Care Physician or a Neurologist and advised to do so again. Do not take more than prescribed Pain, Sleep and Anxiety Medications. You were cared for by a hospitalist during your hospital stay. If you have any questions about your discharge medications or the care you received while you were in the hospital after you are discharged, you can call the unit and ask to speak with the hospitalist on call if the hospitalist that took care of you is not available.  Once you are discharged, your primary care physician will handle any further medical issues. Please note that NO REFILLS for any discharge medications will be authorized once you are discharged, as it is imperative that you return to your primary care physician (or establish a relationship  with a primary care physician if you do not have one) for your aftercare needs so that they can reassess your need for medications and monitor your lab values. You Must read complete instructions/literature along with all the possible adverse reactions/side effects for all the Medicines you take and that have been prescribed to you. Take any new Medicines after you have completely understood and accept all the possible adverse reactions/side effects. Wear Seat belts while driving. If you have smoked or chewed Tobacco in the last 2 yrs please stop smoking and/or stop any Recreational drug use.   Increase activity slowly    Complete by:  As directed      Discharge Exam: Filed Weights   03/23/16 2047 03/25/16 0524 03/25/16 2107  Weight: 65.1 kg (143 lb 8 oz) 67.4 kg (148 lb 9.6 oz) 67.9 kg (149 lb 9.6 oz)   Vitals:   03/25/16 2107 03/26/16 0638  BP: (!) 175/75 (!) 136/47  Pulse: 70 65  Resp: 18 18  Temp: 98.6 F (37 C) 98.7 F (37.1 C)   General: Appear in no distress, no Rash; Oral Mucosa moist. Cardiovascular: S1 and S2 Present, no Murmur, no JVD Respiratory: Bilateral Air entry present and Clear to Auscultation, no Crackles, no wheezes Abdomen: Bowel Sound present, Soft and no tenderness Extremities: no Pedal edema, no calf tenderness Neurology: Grossly no focal neuro deficit.  The results of significant diagnostics from this hospitalization (including imaging, microbiology, ancillary and laboratory) are listed below for reference.    Significant Diagnostic Studies: Ct Abdomen Pelvis Wo Contrast  Result Date: 03/23/2016 CLINICAL DATA:  Acute generalized abdominal pain. EXAM: CT ABDOMEN AND PELVIS WITHOUT CONTRAST TECHNIQUE: Multidetector CT imaging of the abdomen and pelvis was performed following the standard protocol without IV contrast. COMPARISON:  CT scan of March 22, 2016. FINDINGS: Lower chest: No acute abnormality. Hepatobiliary: No focal liver abnormality is seen. No  gallstones, gallbladder wall thickening, or biliary dilatation. Pancreas: Pancreas appears to be thin and atrophic. Inflammatory changes are noted around the pancreatic head suggesting possible pancreatitis. Spleen: Normal in size without focal abnormality. Adrenals/Urinary Tract: Adrenal glands are unremarkable. Kidneys are normal, without renal calculi, focal lesion, or hydronephrosis. Bladder is unremarkable. Stomach/Bowel: Stomach is within normal limits. Appendix appears normal. No evidence of bowel wall thickening, distention, or inflammatory changes. Vascular/Lymphatic: 3.6 cm infrarenal abdominal aortic aneurysm is noted. Atherosclerosis is noted. No significant adenopathy is noted. Reproductive: Prostate is unremarkable. Other: No abdominal wall hernia or abnormality. No abdominopelvic ascites. Musculoskeletal: No acute or significant osseous findings. IMPRESSION: 3.6 cm infrarenal abdominal aortic aneurysm. Recommend followup by  ultrasound in 2 years. This recommendation follows ACR consensus guidelines: White Paper of the ACR Incidental Findings Committee II on Vascular Findings. J Am Coll Radiol 2013; 10:789-794. Possible inflammatory changes are noted around the pancreatic head which may represent pancreatitis. Clinical correlation is recommended. Electronically Signed   By: Marijo Conception, M.D.   On: 03/23/2016 16:04   Dg Chest 2 View  Result Date: 03/23/2016 CLINICAL DATA:  Chest pain EXAM: CHEST  2 VIEW COMPARISON:  None. FINDINGS: The heart size and mediastinal contours are within normal limits. Both lungs are clear. The visualized skeletal structures are unremarkable. IMPRESSION: No active cardiopulmonary disease. Electronically Signed   By: Kathreen Devoid   On: 03/23/2016 13:00   Ct Angio Abdomen Pelvis  W &/or Wo Contrast  Result Date: 03/22/2016 CLINICAL DATA:  Abdominal aortic aneurysm EXAM: CT ANGIOGRAPHY ABDOMEN AND PELVIS TECHNIQUE: Multidetector CT imaging of the abdomen and pelvis was  performed using the standard protocol during bolus administration of intravenous contrast. Multiplanar reconstructed images including MIPs were obtained and reviewed to evaluate the vascular anatomy. CONTRAST:  50 cc Isovue 370 COMPARISON:  04/27/2014, 02/19/2016 FINDINGS: Arterial findings: Aorta: Diffuse moderate atherosclerosis of the aorta. Infrarenal fusiform abdominal aortic aneurysm, maximal AP diameter 3.5 cm, transverse diameter 3 cm. This is slightly enlarged compared to 04/27/2014 measuring 3.0 x 2.8 cm, but stable compared to 02/21/2016. Celiac axis:          Widely patent origin and branches. Superior mesenteric:  Widely patent origin and branches. Left renal: Atherosclerotic origin but remains patent. Small accessory left lower pole renal artery is also patent. Right renal: Atherosclerotic origin with mild luminal narrowing. Small accessory branch to the right kidney lower pole is also patent. Inferior mesenteric:  IMA remains patent off of the anterior aorta. Left iliac: Left common, internal and external iliac arteries are tortuous and atherosclerotic but patent. Right iliac: Right common, internal and external iliac arteries are also patent with mild tortuosity. Mild atherosclerotic changes evident. No inflow disease. Venous findings:      No significant finding. Review of the MIP images confirms the above findings. Nonvascular findings: No acute process in the lower chest. Remote granulomatous disease evident with left infrahilar calcified lymph nodes. No pericardial or pleural effusion. Native coronary atherosclerosis evident. No focal hepatic abnormality within the limits of arterial phase imaging. No biliary dilatation. Gallbladder is mildly distended. Slight biliary prominence. Appearance remains nonspecific and unchanged. Pancreas is thin and atrophic without significant ductal dilatation or focal abnormality. No surrounding inflammatory process or fluid collection. Normal arterial phase  enhancement of the spleen. Adrenal glands, kidneys, ureters and bladder are within normal limits for age. No renal obstruction or hydronephrosis. No obstructing ureteral calculus. Negative for bowel obstruction, significant dilatation, ileus, or free air. No fluid collection or abscess. Appendix is unremarkable. No adenopathy Prostate gland is normal in size. No inguinal abnormality or hernia.  Intact abdominal wall. Healed deformity of the left iliac crest. No acute osseous finding. No acute compression fracture. IMPRESSION: 3.5 cm infrarenal atherosclerotic aneurysm, slightly larger than 04/27/2014 but stable compared to 02/21/2016. Otherwise stable CT exam of the abdomen and pelvis. Electronically Signed   By: Jerilynn Mages.  Shick M.D.   On: 03/22/2016 17:02    Microbiology: No results found for this or any previous visit (from the past 240 hour(s)).   Labs: CBC:  Recent Labs Lab 03/23/16 1143 03/24/16 0551 03/26/16 0902  WBC 8.0 7.5 6.0  HGB 12.1* 10.3* 9.7*  HCT 36.0* 31.3*  29.3*  MCV 96.3 96.0 95.4  PLT 185 179 [id number]*   Basic Metabolic Panel:  Recent Labs Lab 03/23/16 1143 03/24/16 0551 03/26/16 0902  NA 138 140 141  K 3.9 5.0 3.6  CL 103 107 109  CO2 25 26 28   GLUCOSE 142* 93 89  BUN 40* 31* 17  CREATININE 1.91* 1.75* 1.41*  CALCIUM 9.7 9.2 8.8*  MG  --  2.2 2.0   Liver Function Tests:  Recent Labs Lab 03/23/16 1143 03/24/16 0551  AST 27 22  ALT 16* 14*  ALKPHOS 80 70  BILITOT 0.9 0.8  PROT 6.7 5.6*  ALBUMIN 3.9 3.2*    Recent Labs Lab 03/23/16 1143  LIPASE 22   CBG:  Recent Labs Lab 03/24/16 0819  GLUCAP 91   Time spent: 30 minutes  Signed:  Alexandro Line  [facility] 03/26/2016 , 6:43 PM

## 2016-04-02 ENCOUNTER — Encounter: Payer: Self-pay | Admitting: Vascular Surgery

## 2016-04-13 ENCOUNTER — Encounter (HOSPITAL_COMMUNITY): Payer: Medicare Other

## 2016-04-13 ENCOUNTER — Ambulatory Visit: Payer: Medicare Other | Admitting: Vascular Surgery

## 2016-04-24 ENCOUNTER — Encounter: Payer: Self-pay | Admitting: Internal Medicine

## 2016-06-04 ENCOUNTER — Ambulatory Visit: Payer: Medicare Other | Admitting: Internal Medicine

## 2016-06-13 ENCOUNTER — Encounter: Payer: Self-pay | Admitting: Vascular Surgery

## 2016-06-22 ENCOUNTER — Encounter: Payer: Self-pay | Admitting: Vascular Surgery

## 2016-06-22 ENCOUNTER — Ambulatory Visit (INDEPENDENT_AMBULATORY_CARE_PROVIDER_SITE_OTHER): Payer: Medicare Other | Admitting: Vascular Surgery

## 2016-06-22 ENCOUNTER — Ambulatory Visit (HOSPITAL_COMMUNITY)
Admission: RE | Admit: 2016-06-22 | Discharge: 2016-06-22 | Disposition: A | Discharge status: Home / Self Care | Payer: Medicare Other | Source: Ambulatory Visit | Attending: Vascular Surgery | Admitting: Vascular Surgery

## 2016-06-22 VITALS — BP 135/69 | HR 64 | Temp 97.4°F | Resp 16 | Ht 71.0 in | Wt 149.0 lb

## 2016-06-22 DIAGNOSIS — I714 Abdominal aortic aneurysm, without rupture, unspecified: Secondary | ICD-10-CM

## 2016-06-22 DIAGNOSIS — I7102 Dissection of abdominal aorta: Secondary | ICD-10-CM | POA: Diagnosis not present

## 2016-06-22 NOTE — Progress Notes (Signed)
Patient ID: Paul Odonnell, male   DOB: [birth year], 83 y.o.   MRN: [id number]  Reason for Consult: Re-evaluation   Referred by Tommy Medal, MD  Subjective:     HPI:  Paul Odonnell is a 83 y.o. male recently admitted with abdominal pain and found to have a focal dissection and aneurysm of his distal aorta. He was subsequently diagnosed chronic pancreatitis. He is now out of the hospital and tolerating a diet well. He is at his usual state of health at this time. He has quit smoking since his hospital stay.  Past Medical History:  Diagnosis Date  . AAA (abdominal aortic aneurysm) ([place])   . Anemia   . Asthma   . BPH (benign prostatic hyperplasia)   . CAD (coronary artery disease)   . Depression   . Diverticulosis   . DM (diabetes mellitus) ([place])   . Gastritis   . GERD (gastroesophageal reflux disease)   . HH (hiatus hernia)   . HTN (hypertension)   . Hypothyroidism   . IBS (irritable bowel syndrome)   . MI (myocardial infarction) ([place])   . Osteoarthritis   . Pancreatitis    with pancreas divisum  . Peptic stricture of esophagus   . Prostate cancer ([place])    radiation 2011  . Pure hypercholesterolemia   . Renal insufficiency   . Sepsis due to Pseudomonas ([place])    pyelonephritis  . Urinary obstruction   . Vitamin B 12 deficiency    Family History  Problem Relation Age of Onset  . Diabetes Mother   . Heart attack Father   . Heart attack Brother        x 2 brothers   Past Surgical History:  Procedure Laterality Date  . CORONARY ANGIOPLASTY WITH STENT PLACEMENT      Short Social History:  Social History  Substance Use Topics  . Smoking status: Current Some Day Smoker  . Smokeless tobacco: Current User  . Alcohol use No    Allergies  Allergen Reactions  . Codeine Other (See Comments)    Pancreatic problems   . Dilaudid [Hydromorphone Hcl] Other (See Comments)    Affects patient's thinking  . Morphine And Related Other (See Comments)    Affects patient's  thinking    Current Outpatient Prescriptions  Medication Sig Dispense Refill  . Ascorbic Acid (VITAMIN C) 1000 MG tablet Take 1,000 mg by mouth daily.    Marland Kitchen aspirin 325 MG tablet Take 325 mg by mouth daily.    Marland Kitchen atorvastatin (LIPITOR) 40 MG tablet take 1/2 tablet by mouth once daily (Patient taking differently: take 1 tablet by mouth once daily at bedtime) 45 tablet 1  . b complex vitamins tablet Take 1 tablet by mouth daily.    . Cholecalciferol (VITAMIN D PO) Take 1 tablet by mouth daily.    Marland Kitchen CREON 36000 units CPEP capsule Take 36,000 Units by mouth 3 (three) times daily.  0  . docusate sodium 100 MG CAPS Take 200 mg by mouth 2 (two) times daily. (Patient taking differently: Take 400 mg by mouth daily with supper. )    . escitalopram (LEXAPRO) 10 MG tablet Take 10 mg by mouth daily.    Marland Kitchen gabapentin (NEURONTIN) 100 MG capsule Take 100 mg by mouth 2 (two) times daily.  0  . levothyroxine (SYNTHROID, LEVOTHROID) 50 MCG tablet Take 50 mcg by mouth daily before breakfast.     . mirtazapine (REMERON) 15 MG tablet Take 1 tablet (15 mg total) by  mouth at bedtime. 30 tablet 0  . OXYCONTIN 20 MG 12 hr tablet Take 20 mg by mouth every morning.   0  . polyethylene glycol (MIRALAX / GLYCOLAX) packet Take 17 g by mouth daily. 14 each 0  . vitamin E 100 UNIT capsule Take 100 Units by mouth daily.    Marland Kitchen amLODipine (NORVASC) 5 MG tablet Take 1 tablet (5 mg total) by mouth daily. (Patient not taking: Reported on 06/22/2016) 30 tablet 0  . feeding supplement (BOOST / RESOURCE BREEZE) LIQD Take 1 Container by mouth 3 (three) times daily between meals. (Patient not taking: Reported on 06/22/2016) 30 Container 0  . oxyCODONE (OXY IR/ROXICODONE) 5 MG immediate release tablet Take 1 tablet (5 mg total) by mouth every 6 (six) hours as needed for moderate pain or severe pain. (Patient not taking: Reported on 06/22/2016) 10 tablet 0  . OXYCONTIN 30 MG T12A Take 30 mg by mouth every evening.   0  . QUEtiapine (SEROQUEL) 25 MG  tablet Take 1 tablet (25 mg total) by mouth at bedtime. (Patient not taking: Reported on 06/22/2016) 30 tablet 0   No current facility-administered medications for this visit.     Review of Systems  Constitutional:  Constitutional negative. Eyes: Eyes negative.  Cardiovascular: Positive for leg swelling.  GI: Positive for abdominal pain.  Musculoskeletal: Musculoskeletal negative.  Skin: Skin negative.  Neurological: Neurological negative. Hematologic: Hematologic/lymphatic negative.  Psychiatric: Psychiatric negative.        Objective:  Objective   Vitals:   06/22/16 0849  BP: 135/69  Pulse: 64  Resp: 16  Temp: 97.4 F (36.3 C)  SpO2: 97%  Weight: 149 lb (67.6 kg)  Height: 5\' 11"  (1.803 m)   Body mass index is 20.78 kg/m.  Physical Exam  Constitutional: He is oriented to person, place, and time. He appears well-developed.  HENT:  Head: Normocephalic.  Eyes: Pupils are equal, round, and reactive to light.  Cardiovascular: Normal rate.   Pulses:      Femoral pulses are 2+ on the right side, and 2+ on the left side.      Popliteal pulses are 1+ on the right side, and 1+ on the left side.  Pulmonary/Chest: Effort normal.  Abdominal: He exhibits no mass.  Musculoskeletal: Normal range of motion. He exhibits edema.  Neurological: He is alert and oriented to person, place, and time.  Skin: Skin is warm and dry.  Psychiatric: He has a normal mood and affect. His behavior is normal. Judgment and thought content normal.    Data: I have independently interpreted his aortic duplex demonstrating 3.3 x 3 cm aneurysm with no dissection.     Assessment/Plan:     83 year old male follows up from recent admission with abdominal pain at 3.6 cm infrarenal abdominal aortic aneurysm. We demonstrated 3.3 cm today but overall reviewing CT scans he looks stable and does not have any complaints of this. His abdominal pain was likely secondary to his pancreatitis and this was an  incidental finding. Given the size criteria he can follow-up in 2 years with repeat ultrasound. We discussed tobacco abdominal pain as the presenting findings he does was good understanding and we will see him in 2 years.     Waynetta Sandy MD [facility]

## 2016-07-30 ENCOUNTER — Other Ambulatory Visit: Payer: Self-pay | Admitting: Cardiology

## 2016-10-22 ENCOUNTER — Ambulatory Visit (INDEPENDENT_AMBULATORY_CARE_PROVIDER_SITE_OTHER): Payer: Medicare Other | Admitting: Cardiology

## 2016-10-22 ENCOUNTER — Encounter: Payer: Self-pay | Admitting: Cardiology

## 2016-10-22 VITALS — BP 146/76 | HR 72 | Ht 71.5 in | Wt 155.8 lb

## 2016-10-22 DIAGNOSIS — I252 Old myocardial infarction: Secondary | ICD-10-CM | POA: Diagnosis not present

## 2016-10-22 DIAGNOSIS — I714 Abdominal aortic aneurysm, without rupture, unspecified: Secondary | ICD-10-CM

## 2016-10-22 DIAGNOSIS — I2510 Atherosclerotic heart disease of native coronary artery without angina pectoris: Secondary | ICD-10-CM | POA: Diagnosis not present

## 2016-10-22 DIAGNOSIS — I10 Essential (primary) hypertension: Secondary | ICD-10-CM
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
## 2016-10-22 NOTE — Patient Instructions (Signed)
Medication Instructions:  The current medical regimen is effective;  continue present plan and medications.  Follow-Up: Follow up in 1 year with Dr. Marlou Porch.  You will receive a letter in the mail 2 months before you are due.  Please call us when you receive this letter to schedule your follow up appointment.   If you need a refill on your cardiac medications before your next appointment, please call your pharmacy.

## 2017-01-07 ENCOUNTER — Other Ambulatory Visit: Payer: Self-pay | Admitting: Cardiology

## 2017-02-04 ENCOUNTER — Other Ambulatory Visit (HOSPITAL_COMMUNITY): Payer: Self-pay | Admitting: Internal Medicine

## 2017-02-04 DIAGNOSIS — R945 Abnormal results of liver function studies: Secondary | ICD-10-CM

## 2017-02-07 ENCOUNTER — Encounter (HOSPITAL_COMMUNITY): Payer: Medicare Other

## 2017-02-13 ENCOUNTER — Encounter (HOSPITAL_COMMUNITY): Payer: Medicare Other

## 2017-02-13 ENCOUNTER — Ambulatory Visit (HOSPITAL_COMMUNITY): Admission: RE | Admit: 2017-02-13 | Payer: Medicare Other | Source: Ambulatory Visit

## 2017-02-18 ENCOUNTER — Encounter (HOSPITAL_COMMUNITY): Payer: Medicare Other

## 2017-02-18 ENCOUNTER — Encounter (HOSPITAL_COMMUNITY)
Admission: RE | Admit: 2017-02-18 | Discharge: 2017-02-18 | Disposition: A | Discharge status: Home / Self Care | Payer: Medicare Other | Source: Ambulatory Visit | Attending: Internal Medicine | Admitting: Internal Medicine

## 2017-02-18 DIAGNOSIS — K7689 Other specified diseases of liver: Secondary | ICD-10-CM | POA: Diagnosis not present

## 2017-02-18 DIAGNOSIS — R945 Abnormal results of liver function studies: Secondary | ICD-10-CM

## 2017-02-18 MED ORDER — TECHNETIUM TC 99M MEDRONATE IV KIT
25.0000 | PACK | Freq: Once | INTRAVENOUS | Status: AC | PRN
  Administered 2017-02-18: 25 via INTRAVENOUS

## 2017-03-03 ENCOUNTER — Encounter (HOSPITAL_COMMUNITY): Payer: Self-pay | Admitting: Certified Registered Nurse Anesthetist

## 2017-03-03 ENCOUNTER — Inpatient Hospital Stay (HOSPITAL_COMMUNITY)
Admission: EM | Admit: 2017-03-03 | Discharge: 2017-03-22 | DRG: 023 | Disposition: E | Payer: Medicare Other | Attending: Neurosurgery | Admitting: Neurosurgery

## 2017-03-03 ENCOUNTER — Emergency Department (HOSPITAL_COMMUNITY): Payer: Medicare Other

## 2017-03-03 ENCOUNTER — Inpatient Hospital Stay (HOSPITAL_COMMUNITY): Payer: Medicare Other | Admitting: Certified Registered Nurse Anesthetist

## 2017-03-03 ENCOUNTER — Inpatient Hospital Stay (HOSPITAL_COMMUNITY): Payer: Medicare Other

## 2017-03-03 ENCOUNTER — Encounter (HOSPITAL_COMMUNITY): Admission: EM | Disposition: E | Payer: Self-pay | Source: Home / Self Care | Attending: Neurological Surgery

## 2017-03-03 DIAGNOSIS — I629 Nontraumatic intracranial hemorrhage, unspecified: Secondary | ICD-10-CM

## 2017-03-03 DIAGNOSIS — Z87891 Personal history of nicotine dependence: Secondary | ICD-10-CM

## 2017-03-03 DIAGNOSIS — W19XXXA Unspecified fall, initial encounter: Secondary | ICD-10-CM | POA: Diagnosis present

## 2017-03-03 DIAGNOSIS — M1990 Unspecified osteoarthritis, unspecified site: Secondary | ICD-10-CM | POA: Diagnosis present

## 2017-03-03 DIAGNOSIS — F329 Major depressive disorder, single episode, unspecified: Secondary | ICD-10-CM | POA: Diagnosis present

## 2017-03-03 DIAGNOSIS — S06350A Traumatic hemorrhage of left cerebrum without loss of consciousness, initial encounter: Secondary | ICD-10-CM | POA: Diagnosis present

## 2017-03-03 DIAGNOSIS — R4701 Aphasia: Secondary | ICD-10-CM | POA: Diagnosis not present

## 2017-03-03 DIAGNOSIS — Z515 Encounter for palliative care: Secondary | ICD-10-CM | POA: Diagnosis present

## 2017-03-03 DIAGNOSIS — Z8546 Personal history of malignant neoplasm of prostate: Secondary | ICD-10-CM

## 2017-03-03 DIAGNOSIS — G8929 Other chronic pain: Secondary | ICD-10-CM | POA: Diagnosis present

## 2017-03-03 DIAGNOSIS — J988 Other specified respiratory disorders: Secondary | ICD-10-CM

## 2017-03-03 DIAGNOSIS — R402214 Coma scale, best verbal response, none, 24 hours or more after hospital admission: Secondary | ICD-10-CM | POA: Diagnosis not present

## 2017-03-03 DIAGNOSIS — R29703 NIHSS score 3: Secondary | ICD-10-CM | POA: Diagnosis present

## 2017-03-03 DIAGNOSIS — E785 Hyperlipidemia, unspecified: Secondary | ICD-10-CM | POA: Diagnosis present

## 2017-03-03 DIAGNOSIS — S065X9A Traumatic subdural hemorrhage with loss of consciousness of unspecified duration, initial encounter: Secondary | ICD-10-CM

## 2017-03-03 DIAGNOSIS — I10 Essential (primary) hypertension: Secondary | ICD-10-CM | POA: Diagnosis present

## 2017-03-03 DIAGNOSIS — E538 Deficiency of other specified B group vitamins: Secondary | ICD-10-CM | POA: Diagnosis present

## 2017-03-03 DIAGNOSIS — K219 Gastro-esophageal reflux disease without esophagitis: Secondary | ICD-10-CM | POA: Diagnosis present

## 2017-03-03 DIAGNOSIS — G8194 Hemiplegia, unspecified affecting left nondominant side: Secondary | ICD-10-CM | POA: Diagnosis present

## 2017-03-03 DIAGNOSIS — Z923 Personal history of irradiation: Secondary | ICD-10-CM

## 2017-03-03 DIAGNOSIS — Z66 Do not resuscitate: Secondary | ICD-10-CM | POA: Diagnosis present

## 2017-03-03 DIAGNOSIS — N400 Enlarged prostate without lower urinary tract symptoms: Secondary | ICD-10-CM | POA: Diagnosis present

## 2017-03-03 DIAGNOSIS — R29715 NIHSS score 15: Secondary | ICD-10-CM | POA: Diagnosis not present

## 2017-03-03 DIAGNOSIS — Z79891 Long term (current) use of opiate analgesic: Secondary | ICD-10-CM

## 2017-03-03 DIAGNOSIS — Z79899 Other long term (current) drug therapy: Secondary | ICD-10-CM

## 2017-03-03 DIAGNOSIS — I161 Hypertensive emergency: Secondary | ICD-10-CM | POA: Diagnosis present

## 2017-03-03 DIAGNOSIS — R402324 Coma scale, best motor response, extension, 24 hours or more after hospital admission: Secondary | ICD-10-CM | POA: Diagnosis not present

## 2017-03-03 DIAGNOSIS — Z7982 Long term (current) use of aspirin: Secondary | ICD-10-CM

## 2017-03-03 DIAGNOSIS — Z8679 Personal history of other diseases of the circulatory system: Secondary | ICD-10-CM

## 2017-03-03 DIAGNOSIS — E039 Hypothyroidism, unspecified: Secondary | ICD-10-CM | POA: Diagnosis present

## 2017-03-03 DIAGNOSIS — J45909 Unspecified asthma, uncomplicated: Secondary | ICD-10-CM | POA: Diagnosis present

## 2017-03-03 DIAGNOSIS — K589 Irritable bowel syndrome without diarrhea: Secondary | ICD-10-CM | POA: Diagnosis present

## 2017-03-03 DIAGNOSIS — I2510 Atherosclerotic heart disease of native coronary artery without angina pectoris: Secondary | ICD-10-CM | POA: Diagnosis present

## 2017-03-03 DIAGNOSIS — R402314 Coma scale, best motor response, none, 24 hours or more after hospital admission: Secondary | ICD-10-CM | POA: Diagnosis not present

## 2017-03-03 DIAGNOSIS — R29729 NIHSS score 29: Secondary | ICD-10-CM | POA: Diagnosis present

## 2017-03-03 DIAGNOSIS — I611 Nontraumatic intracerebral hemorrhage in hemisphere, cortical: Secondary | ICD-10-CM

## 2017-03-03 DIAGNOSIS — R4020 Unspecified coma: Secondary | ICD-10-CM | POA: Diagnosis not present

## 2017-03-03 DIAGNOSIS — Z885 Allergy status to narcotic agent status: Secondary | ICD-10-CM

## 2017-03-03 DIAGNOSIS — Z79890 Hormone replacement therapy (postmenopausal): Secondary | ICD-10-CM

## 2017-03-03 DIAGNOSIS — F0390 Unspecified dementia without behavioral disturbance: Secondary | ICD-10-CM | POA: Diagnosis present

## 2017-03-03 DIAGNOSIS — G936 Cerebral edema: Secondary | ICD-10-CM | POA: Diagnosis present

## 2017-03-03 DIAGNOSIS — I252 Old myocardial infarction: Secondary | ICD-10-CM

## 2017-03-03 DIAGNOSIS — Y92002 Bathroom of unspecified non-institutional (private) residence single-family (private) house as the place of occurrence of the external cause: Secondary | ICD-10-CM

## 2017-03-03 DIAGNOSIS — Z9889 Other specified postprocedural states: Secondary | ICD-10-CM

## 2017-03-03 DIAGNOSIS — J9600 Acute respiratory failure, unspecified whether with hypoxia or hypercapnia: Secondary | ICD-10-CM | POA: Diagnosis present

## 2017-03-03 DIAGNOSIS — R402114 Coma scale, eyes open, never, 24 hours or more after hospital admission: Secondary | ICD-10-CM | POA: Diagnosis not present

## 2017-03-03 DIAGNOSIS — Z833 Family history of diabetes mellitus: Secondary | ICD-10-CM

## 2017-03-03 DIAGNOSIS — Z955 Presence of coronary angioplasty implant and graft: Secondary | ICD-10-CM

## 2017-03-03 DIAGNOSIS — S065XAA Traumatic subdural hemorrhage with loss of consciousness status unknown, initial encounter: Secondary | ICD-10-CM

## 2017-03-03 DIAGNOSIS — J9690 Respiratory failure, unspecified, unspecified whether with hypoxia or hypercapnia: Secondary | ICD-10-CM

## 2017-03-03 DIAGNOSIS — E1165 Type 2 diabetes mellitus with hyperglycemia: Secondary | ICD-10-CM | POA: Diagnosis present

## 2017-03-03 DIAGNOSIS — Z8249 Family history of ischemic heart disease and other diseases of the circulatory system: Secondary | ICD-10-CM

## 2017-03-03 HISTORY — PX: CRANIOTOMY: SHX93

## 2017-03-03 LAB — I-STAT ARTERIAL BLOOD GAS, ED
Bicarbonate: 26.2 mmol/L (ref 20.0–28.0)
O2 SAT: 100 %
TCO2: 28 mmol/L (ref 22–32)
pCO2 arterial: 49.1 mmHg — ABNORMAL HIGH (ref 32.0–48.0)
pH, Arterial: 7.334 — ABNORMAL LOW (ref 7.350–7.450)
pO2, Arterial: 375 mmHg — ABNORMAL HIGH (ref 83.0–108.0)

## 2017-03-03 LAB — RAPID URINE DRUG SCREEN, HOSP PERFORMED
AMPHETAMINES: NOT DETECTED
BENZODIAZEPINES: NOT DETECTED
Barbiturates: NOT DETECTED
Cocaine: NOT DETECTED
OPIATES: NOT DETECTED
Tetrahydrocannabinol: NOT DETECTED

## 2017-03-03 LAB — COMPREHENSIVE METABOLIC PANEL
ALT: 19 U/L (ref 17–63)
ANION GAP: 13 (ref 5–15)
AST: 30 U/L (ref 15–41)
Albumin: 3.5 g/dL (ref 3.5–5.0)
Alkaline Phosphatase: 118 U/L (ref 38–126)
BUN: 12 mg/dL (ref 6–20)
CHLORIDE: 107 mmol/L (ref 101–111)
CO2: 23 mmol/L (ref 22–32)
CREATININE: 1.61 mg/dL — AB (ref 0.61–1.24)
Calcium: 9 mg/dL (ref 8.9–10.3)
GFR, EST AFRICAN AMERICAN: 44 mL/min — AB (ref >60)
GFR, EST NON AFRICAN AMERICAN: 38 mL/min — AB (ref >60)
Glucose, Bld: 143 mg/dL — ABNORMAL HIGH (ref 65–99)
POTASSIUM: 3.1 mmol/L — AB (ref 3.5–5.1)
SODIUM: 143 mmol/L (ref 135–145)
Total Bilirubin: 0.5 mg/dL (ref 0.3–1.2)
Total Protein: 6.8 g/dL (ref 6.5–8.1)

## 2017-03-03 LAB — URINALYSIS, ROUTINE W REFLEX MICROSCOPIC
Bilirubin Urine: NEGATIVE
Glucose, UA: 50 mg/dL — AB
HGB URINE DIPSTICK: NEGATIVE
Ketones, ur: NEGATIVE mg/dL
Leukocytes, UA: NEGATIVE
Nitrite: NEGATIVE
PH: 6 (ref 5.0–8.0)
Protein, ur: NEGATIVE mg/dL
SPECIFIC GRAVITY, URINE: 1.011 (ref 1.005–1.030)

## 2017-03-03 LAB — DIFFERENTIAL
Basophils Absolute: 0 10*3/uL (ref 0.0–0.1)
Basophils Relative: 0 %
EOS ABS: 0.1 10*3/uL (ref 0.0–0.7)
EOS PCT: 2 %
Lymphocytes Relative: 33 %
Lymphs Abs: 2.1 10*3/uL (ref 0.7–4.0)
MONO ABS: 0.5 10*3/uL (ref 0.1–1.0)
MONOS PCT: 8 %
NEUTROS ABS: 3.4 10*3/uL (ref 1.7–7.7)
Neutrophils Relative %: 57 %

## 2017-03-03 LAB — GLUCOSE, CAPILLARY: Glucose-Capillary: 310 mg/dL — ABNORMAL HIGH (ref 65–99)

## 2017-03-03 LAB — POCT I-STAT 3, ART BLOOD GAS (G3+)
Acid-base deficit: 5 mmol/L — ABNORMAL HIGH (ref 0.0–2.0)
Bicarbonate: 20 mmol/L (ref 20.0–28.0)
O2 Saturation: 99 %
PH ART: 7.336 — AB (ref 7.350–7.450)
TCO2: 21 mmol/L — ABNORMAL LOW (ref 22–32)
pCO2 arterial: 37.4 mmHg (ref 32.0–48.0)
pO2, Arterial: 163 mmHg — ABNORMAL HIGH (ref 83.0–108.0)

## 2017-03-03 LAB — CBC
HEMATOCRIT: 32.7 % — AB (ref 39.0–52.0)
HEMATOCRIT: 25.3 % — AB (ref 39.0–52.0)
HEMOGLOBIN: 8.6 g/dL — AB (ref 13.0–17.0)
Hemoglobin: 10.7 g/dL — ABNORMAL LOW (ref 13.0–17.0)
MCH: 31.8 pg (ref 26.0–34.0)
MCH: 32.7 pg (ref 26.0–34.0)
MCHC: 32.7 g/dL (ref 30.0–36.0)
MCHC: 34 g/dL (ref 30.0–36.0)
MCV: 97.3 fL (ref 78.0–100.0)
MCV: 96.2 fL (ref 78.0–100.0)
PLATELETS: 168 10*3/uL (ref 150–400)
Platelets: 133 10*3/uL — ABNORMAL LOW (ref 150–400)
RBC: 3.36 MIL/uL — ABNORMAL LOW (ref 4.22–5.81)
RBC: 2.63 MIL/uL — ABNORMAL LOW (ref 4.22–5.81)
RDW: 13.5 % (ref 11.5–15.5)
RDW: 13.8 % (ref 11.5–15.5)
WBC: 6.1 10*3/uL (ref 4.0–10.5)
WBC: 7.8 10*3/uL (ref 4.0–10.5)

## 2017-03-03 LAB — PROTIME-INR
INR: 1.1
PROTHROMBIN TIME: 14.1 s (ref 11.4–15.2)

## 2017-03-03 LAB — BASIC METABOLIC PANEL
ANION GAP: 16 — AB (ref 5–15)
BUN: 14 mg/dL (ref 6–20)
CO2: 18 mmol/L — ABNORMAL LOW (ref 22–32)
Calcium: 8.4 mg/dL — ABNORMAL LOW (ref 8.9–10.3)
Chloride: 108 mmol/L (ref 101–111)
Creatinine, Ser: 1.73 mg/dL — ABNORMAL HIGH (ref 0.61–1.24)
GFR, EST AFRICAN AMERICAN: 40 mL/min — AB (ref >60)
GFR, EST NON AFRICAN AMERICAN: 34 mL/min — AB (ref >60)
Glucose, Bld: 252 mg/dL — ABNORMAL HIGH (ref 65–99)
Potassium: 4.1 mmol/L (ref 3.5–5.1)
SODIUM: 142 mmol/L (ref 135–145)

## 2017-03-03 LAB — TRIGLYCERIDES: Triglycerides: 155 mg/dL — ABNORMAL HIGH (ref <150)

## 2017-03-03 LAB — MRSA PCR SCREENING: MRSA BY PCR: NEGATIVE

## 2017-03-03 LAB — APTT: aPTT: 25 seconds (ref 24–36)

## 2017-03-03 LAB — I-STAT TROPONIN, ED: TROPONIN I, POC: 0 ng/mL (ref 0.00–0.08)

## 2017-03-03 LAB — ETHANOL

## 2017-03-03 SURGERY — CRANIOTOMY HEMATOMA EVACUATION SUBDURAL
Anesthesia: General | Site: Head | Laterality: Left

## 2017-03-03 MED ORDER — ORAL CARE MOUTH RINSE
15.0000 mL | OROMUCOSAL | Status: DC
  Administered 2017-03-03: 15 mL via OROMUCOSAL

## 2017-03-03 MED ORDER — SODIUM CHLORIDE 0.9 % IR SOLN
Status: DC | PRN
  Administered 2017-03-03: 500 mL

## 2017-03-03 MED ORDER — MICROFIBRILLAR COLL HEMOSTAT EX POWD
CUTANEOUS | Status: AC
  Filled 2017-03-03: qty 5

## 2017-03-03 MED ORDER — SODIUM CHLORIDE 0.9 % IV SOLN
750.0000 mg | Freq: Two times a day (BID) | INTRAVENOUS | Status: DC
  Administered 2017-03-03: 750 mg via INTRAVENOUS
  Filled 2017-03-03 (×2): qty 7.5

## 2017-03-03 MED ORDER — PROPOFOL 1000 MG/100ML IV EMUL: 5.0000 ug/kg/min | Freq: Once | INTRAVENOUS | Status: DC

## 2017-03-03 MED ORDER — FENTANYL CITRATE (PF) 250 MCG/5ML IJ SOLN
INTRAMUSCULAR | Status: AC
  Filled 2017-03-03: qty 5

## 2017-03-03 MED ORDER — BOOST PO LIQD
1.0000 | Freq: Three times a day (TID) | ORAL | Status: DC
  Filled 2017-03-03 (×2): qty 237

## 2017-03-03 MED ORDER — MICROFIBRILLAR COLL HEMOSTAT EX POWD
CUTANEOUS | Status: AC
Start: 2017-03-03 — End: 2017-03-03
  Filled 2017-03-03: qty 5

## 2017-03-03 MED ORDER — VITAMIN C 500 MG PO TABS: 1000.0000 mg | ORAL_TABLET | Freq: Every day | ORAL | Status: DC

## 2017-03-03 MED ORDER — FLEET ENEMA 7-19 GM/118ML RE ENEM: 1.0000 | ENEMA | Freq: Once | RECTAL | Status: DC | PRN

## 2017-03-03 MED ORDER — LEVOTHYROXINE SODIUM 50 MCG PO TABS: 50.0000 ug | ORAL_TABLET | Freq: Every day | ORAL | Status: DC

## 2017-03-03 MED ORDER — FENTANYL CITRATE (PF) 100 MCG/2ML IJ SOLN: 50.0000 ug | INTRAMUSCULAR | Status: DC | PRN

## 2017-03-03 MED ORDER — LEVOTHYROXINE SODIUM 50 MCG PO TABS
50.0000 ug | ORAL_TABLET | Freq: Every day | ORAL | Status: DC
  Administered 2017-03-04: 50 ug
  Filled 2017-03-03: qty 1

## 2017-03-03 MED ORDER — GABAPENTIN 100 MG PO CAPS: 100.0000 mg | ORAL_CAPSULE | Freq: Two times a day (BID) | ORAL | Status: DC

## 2017-03-03 MED ORDER — CHLORHEXIDINE GLUCONATE 0.12% ORAL RINSE (MEDLINE KIT)
15.0000 mL | Freq: Two times a day (BID) | OROMUCOSAL | Status: DC
  Administered 2017-03-03 – 2017-03-04 (×2): 15 mL via OROMUCOSAL

## 2017-03-03 MED ORDER — THROMBIN 5000 UNITS EX SOLR
CUTANEOUS | Status: AC
  Filled 2017-03-03: qty 5000

## 2017-03-03 MED ORDER — PANTOPRAZOLE SODIUM 40 MG PO PACK
40.0000 mg | PACK | ORAL | Status: DC
  Administered 2017-03-03: 40 mg
  Filled 2017-03-03: qty 20

## 2017-03-03 MED ORDER — PROPOFOL 1000 MG/100ML IV EMUL
INTRAVENOUS | Status: AC
  Filled 2017-03-03: qty 100

## 2017-03-03 MED ORDER — LIDOCAINE-EPINEPHRINE 2 %-1:100000 IJ SOLN
INTRAMUSCULAR | Status: AC
  Filled 2017-03-03: qty 1

## 2017-03-03 MED ORDER — BACITRACIN ZINC 500 UNIT/GM EX OINT
TOPICAL_OINTMENT | CUTANEOUS | Status: AC
  Filled 2017-03-03: qty 28.35

## 2017-03-03 MED ORDER — OXYCODONE HCL ER 20 MG PO T12A: 20.0000 mg | EXTENDED_RELEASE_TABLET | ORAL | Status: DC

## 2017-03-03 MED ORDER — DOCUSATE SODIUM 100 MG PO CAPS: 200.0000 mg | ORAL_CAPSULE | Freq: Two times a day (BID) | ORAL | Status: DC

## 2017-03-03 MED ORDER — THROMBIN 20000 UNITS EX SOLR
CUTANEOUS | Status: AC
  Filled 2017-03-03: qty 20000

## 2017-03-03 MED ORDER — B COMPLEX PO TABS: 1.0000 | ORAL_TABLET | Freq: Every day | ORAL | Status: DC

## 2017-03-03 MED ORDER — HYDROMORPHONE HCL 1 MG/ML IJ SOLN: 0.5000 mg | INTRAMUSCULAR | Status: DC | PRN

## 2017-03-03 MED ORDER — BISACODYL 10 MG RE SUPP: 10.0000 mg | Freq: Every day | RECTAL | Status: DC | PRN

## 2017-03-03 MED ORDER — LIDOCAINE-EPINEPHRINE 2 %-1:100000 IJ SOLN
INTRAMUSCULAR | Status: DC | PRN
  Administered 2017-03-03: 20 mL via INTRADERMAL

## 2017-03-03 MED ORDER — MICROFIBRILLAR COLL HEMOSTAT EX POWD
CUTANEOUS | Status: DC | PRN
  Administered 2017-03-03: 1 g via TOPICAL

## 2017-03-03 MED ORDER — CEFAZOLIN SODIUM-DEXTROSE 2-3 GM-%(50ML) IV SOLR
INTRAVENOUS | Status: DC | PRN
  Administered 2017-03-03: 2 g via INTRAVENOUS

## 2017-03-03 MED ORDER — CHLORHEXIDINE GLUCONATE 0.12% ORAL RINSE (MEDLINE KIT): 15.0000 mL | Freq: Two times a day (BID) | OROMUCOSAL | Status: DC

## 2017-03-03 MED ORDER — INSULIN ASPART 100 UNIT/ML ~~LOC~~ SOLN
0.0000 [IU] | SUBCUTANEOUS | Status: DC
Start: 2017-03-03 — End: 2017-03-04
  Administered 2017-03-03: 7 [IU] via SUBCUTANEOUS
  Administered 2017-03-04: 2 [IU] via SUBCUTANEOUS
  Administered 2017-03-04: 3 [IU] via SUBCUTANEOUS
  Administered 2017-03-04: 5 [IU] via SUBCUTANEOUS

## 2017-03-03 MED ORDER — SODIUM CHLORIDE 0.9 % IV SOLN
INTRAVENOUS | Status: DC
  Administered 2017-03-03 – 2017-03-04 (×2): via INTRAVENOUS

## 2017-03-03 MED ORDER — THROMBIN (RECOMBINANT) 5000 UNITS EX SOLR
OROMUCOSAL | Status: DC | PRN
  Administered 2017-03-03 (×2): 5 mL

## 2017-03-03 MED ORDER — NALOXONE HCL 0.4 MG/ML IJ SOLN: 0.0800 mg | INTRAMUSCULAR | Status: DC | PRN

## 2017-03-03 MED ORDER — 0.9 % SODIUM CHLORIDE (POUR BTL) OPTIME
TOPICAL | Status: DC | PRN
  Administered 2017-03-03 (×3): 1000 mL

## 2017-03-03 MED ORDER — NICARDIPINE HCL IN NACL 20-0.86 MG/200ML-% IV SOLN
3.0000 mg/h | INTRAVENOUS | Status: DC
  Administered 2017-03-03 – 2017-03-04 (×5): 10 mg/h via INTRAVENOUS
  Administered 2017-03-04: 12.5 mg/h via INTRAVENOUS
  Administered 2017-03-04: 10 mg/h via INTRAVENOUS
  Administered 2017-03-04: 12.5 mg/h via INTRAVENOUS
  Administered 2017-03-04: 10 mg/h via INTRAVENOUS
  Filled 2017-03-03 (×9): qty 200

## 2017-03-03 MED ORDER — PROPOFOL 10 MG/ML IV BOLUS
INTRAVENOUS | Status: AC
  Filled 2017-03-03: qty 20

## 2017-03-03 MED ORDER — DOCUSATE SODIUM 50 MG/5ML PO LIQD: 100.0000 mg | Freq: Two times a day (BID) | ORAL | Status: DC | PRN

## 2017-03-03 MED ORDER — PHENYLEPHRINE HCL 10 MG/ML IJ SOLN
INTRAMUSCULAR | Status: DC | PRN
  Administered 2017-03-03 (×3): 80 ug via INTRAVENOUS

## 2017-03-03 MED ORDER — ATORVASTATIN CALCIUM 20 MG PO TABS: 20.0000 mg | ORAL_TABLET | Freq: Every day | ORAL | Status: DC

## 2017-03-03 MED ORDER — HYDRALAZINE HCL 20 MG/ML IJ SOLN: 5.0000 mg | INTRAMUSCULAR | Status: DC | PRN

## 2017-03-03 MED ORDER — PROPOFOL 500 MG/50ML IV EMUL
INTRAVENOUS | Status: DC | PRN
  Administered 2017-03-03: 25 ug/kg/min via INTRAVENOUS

## 2017-03-03 MED ORDER — ESCITALOPRAM OXALATE 10 MG PO TABS: 10.0000 mg | ORAL_TABLET | Freq: Every day | ORAL | Status: DC

## 2017-03-03 MED ORDER — ROCURONIUM BROMIDE 10 MG/ML (PF) SYRINGE
PREFILLED_SYRINGE | INTRAVENOUS | Status: DC | PRN
  Administered 2017-03-03: 50 mg via INTRAVENOUS

## 2017-03-03 MED ORDER — BUPIVACAINE-EPINEPHRINE (PF) 0.5% -1:200000 IJ SOLN
INTRAMUSCULAR | Status: AC
  Filled 2017-03-03: qty 30

## 2017-03-03 MED ORDER — BACITRACIN ZINC 500 UNIT/GM EX OINT
TOPICAL_OINTMENT | CUTANEOUS | Status: DC | PRN
  Administered 2017-03-03: 1 via TOPICAL

## 2017-03-03 MED ORDER — QUETIAPINE FUMARATE 25 MG PO TABS: 25.0000 mg | ORAL_TABLET | Freq: Every day | ORAL | Status: DC

## 2017-03-03 MED ORDER — PANCRELIPASE (LIP-PROT-AMYL) 36000-114000 UNITS PO CPEP
36000.0000 [IU] | ORAL_CAPSULE | Freq: Three times a day (TID) | ORAL | Status: DC
  Filled 2017-03-03: qty 1

## 2017-03-03 MED ORDER — CEFAZOLIN SODIUM-DEXTROSE 2-4 GM/100ML-% IV SOLN
2.0000 g | Freq: Three times a day (TID) | INTRAVENOUS | Status: AC
  Administered 2017-03-03 – 2017-03-04 (×2): 2 g via INTRAVENOUS
  Filled 2017-03-03 (×2): qty 100

## 2017-03-03 MED ORDER — SENNA 8.6 MG PO TABS: 1.0000 | ORAL_TABLET | Freq: Two times a day (BID) | ORAL | Status: DC

## 2017-03-03 MED ORDER — SODIUM CHLORIDE 0.9 % IV SOLN
INTRAVENOUS | Status: DC | PRN
  Administered 2017-03-03: 12:00:00 via INTRAVENOUS

## 2017-03-03 MED ORDER — NICARDIPINE HCL IN NACL 20-0.86 MG/200ML-% IV SOLN
0.0000 mg/h | INTRAVENOUS | Status: DC
  Administered 2017-03-03: 5 mg/h via INTRAVENOUS
  Filled 2017-03-03: qty 200

## 2017-03-03 MED ORDER — PROPOFOL 1000 MG/100ML IV EMUL: 0.0000 ug/kg/min | INTRAVENOUS | Status: DC

## 2017-03-03 MED ORDER — MIRTAZAPINE 15 MG PO TABS: 15.0000 mg | ORAL_TABLET | Freq: Every day | ORAL | Status: DC

## 2017-03-03 MED ORDER — DOCUSATE SODIUM 100 MG PO CAPS: 100.0000 mg | ORAL_CAPSULE | Freq: Two times a day (BID) | ORAL | Status: DC

## 2017-03-03 MED ORDER — ONDANSETRON HCL 4 MG/2ML IJ SOLN
4.0000 mg | Freq: Once | INTRAMUSCULAR | Status: AC
  Administered 2017-03-03: 4 mg via INTRAVENOUS
  Filled 2017-03-03: qty 2

## 2017-03-03 MED ORDER — ALBUMIN HUMAN 5 % IV SOLN
INTRAVENOUS | Status: DC | PRN
  Administered 2017-03-03: 13:00:00 via INTRAVENOUS

## 2017-03-03 MED ORDER — HYDROCODONE-ACETAMINOPHEN 5-325 MG PO TABS: 1.0000 | ORAL_TABLET | ORAL | Status: DC | PRN

## 2017-03-03 MED ORDER — PANTOPRAZOLE SODIUM 40 MG IV SOLR: 40.0000 mg | Freq: Every day | INTRAVENOUS | Status: DC

## 2017-03-03 MED ORDER — MICROFIBRILLAR COLL HEMOSTAT EX PADS
MEDICATED_PAD | CUTANEOUS | Status: DC | PRN
  Administered 2017-03-03: 1 via TOPICAL

## 2017-03-03 MED ORDER — ONDANSETRON HCL 4 MG PO TABS: 4.0000 mg | ORAL_TABLET | ORAL | Status: DC | PRN

## 2017-03-03 MED ORDER — THROMBIN (RECOMBINANT) 20000 UNITS EX SOLR
CUTANEOUS | Status: DC | PRN
  Administered 2017-03-03: 20 mL via TOPICAL

## 2017-03-03 MED ORDER — MIDAZOLAM HCL 2 MG/2ML IJ SOLN: 1.0000 mg | INTRAMUSCULAR | Status: DC | PRN

## 2017-03-03 MED ORDER — PROPOFOL 10 MG/ML IV BOLUS
INTRAVENOUS | Status: AC | PRN
  Administered 2017-03-03: 70 mg via INTRAVENOUS

## 2017-03-03 MED ORDER — ONDANSETRON HCL 4 MG/2ML IJ SOLN: 4.0000 mg | INTRAMUSCULAR | Status: DC | PRN

## 2017-03-03 MED ORDER — FENTANYL CITRATE (PF) 250 MCG/5ML IJ SOLN
INTRAMUSCULAR | Status: DC | PRN
  Administered 2017-03-03: 50 ug via INTRAVENOUS

## 2017-03-03 MED ORDER — SODIUM CHLORIDE 0.9 % IV SOLN
1000.0000 mg | Freq: Once | INTRAVENOUS | Status: AC
  Administered 2017-03-03: 1000 mg via INTRAVENOUS
  Filled 2017-03-03: qty 10

## 2017-03-03 MED ORDER — SUCCINYLCHOLINE CHLORIDE 20 MG/ML IJ SOLN
100.0000 mg | Freq: Once | INTRAMUSCULAR | Status: AC
  Administered 2017-03-03: 100 mg via INTRAVENOUS
  Filled 2017-03-03: qty 5

## 2017-03-03 MED ORDER — ONDANSETRON HCL 4 MG/2ML IJ SOLN
INTRAMUSCULAR | Status: DC | PRN
  Administered 2017-03-03: 4 mg via INTRAVENOUS

## 2017-03-03 MED ORDER — VITAMIN E 45 MG (100 UNIT) PO CAPS: 100.0000 [IU] | ORAL_CAPSULE | Freq: Every day | ORAL | Status: DC

## 2017-03-03 MED ORDER — ORAL CARE MOUTH RINSE
15.0000 mL | Freq: Four times a day (QID) | OROMUCOSAL | Status: DC
  Administered 2017-03-04 (×2): 15 mL via OROMUCOSAL

## 2017-03-03 MED ORDER — PHENYLEPHRINE HCL 10 MG/ML IJ SOLN
INTRAMUSCULAR | Status: DC | PRN
  Administered 2017-03-03: 50 ug/min via INTRAVENOUS

## 2017-03-03 MED ORDER — BISACODYL 5 MG PO TBEC: 5.0000 mg | DELAYED_RELEASE_TABLET | Freq: Every day | ORAL | Status: DC | PRN

## 2017-03-03 MED ORDER — SODIUM CHLORIDE 0.9 % IV SOLN
1500.0000 mg | Freq: Once | INTRAVENOUS | Status: AC
  Administered 2017-03-03: 1500 mg via INTRAVENOUS
  Filled 2017-03-03: qty 15

## 2017-03-03 MED ORDER — PROMETHAZINE HCL 25 MG PO TABS: 12.5000 mg | ORAL_TABLET | ORAL | Status: DC | PRN

## 2017-03-03 MED ORDER — SUCCINYLCHOLINE CHLORIDE 20 MG/ML IJ SOLN
INTRAMUSCULAR | Status: AC | PRN
  Administered 2017-03-03: 100 mg via INTRAVENOUS

## 2017-03-03 MED ORDER — PROPOFOL 10 MG/ML IV BOLUS
INTRAVENOUS | Status: DC | PRN
  Administered 2017-03-03: 20 mg via INTRAVENOUS

## 2017-03-03 SURGICAL SUPPLY — 96 items
APL SKNCLS STERI-STRIP NONHPOA (GAUZE/BANDAGES/DRESSINGS)
BAG DECANTER FOR FLEXI CONT (MISCELLANEOUS) ×3 IMPLANT
BENZOIN TINCTURE PRP APPL 2/3 (GAUZE/BANDAGES/DRESSINGS) IMPLANT
BLADE CLIPPER SURG (BLADE) ×3 IMPLANT
BLADE ULTRA TIP 2M (BLADE) ×3 IMPLANT
BNDG GAUZE ELAST 4 BULKY (GAUZE/BANDAGES/DRESSINGS) IMPLANT
BUR ACORN 6.0 PRECISION (BURR) ×1 IMPLANT
BUR ACORN 6.0MM PRECISION (BURR)
BUR MATCHSTICK NEURO 3.0 LAGG (BURR) IMPLANT
BUR SPIRAL ROUTER 2.3 (BUR) ×1 IMPLANT
BUR SPIRAL ROUTER 2.3MM (BUR) ×1
CANISTER SUCT 3000ML PPV (MISCELLANEOUS) ×7 IMPLANT
CARTRIDGE OIL MAESTRO DRILL (MISCELLANEOUS) ×1 IMPLANT
CATH ROBINSON RED A/P 14FR (CATHETERS) ×2 IMPLANT
CHLORAPREP W/TINT 26ML (MISCELLANEOUS) ×3 IMPLANT
CLIP VESOCCLUDE MED 6/CT (CLIP) IMPLANT
DECANTER SPIKE VIAL GLASS SM (MISCELLANEOUS) ×6 IMPLANT
DIFFUSER DRILL AIR PNEUMATIC (MISCELLANEOUS) ×3 IMPLANT
DRAPE MICROSCOPE LEICA (MISCELLANEOUS) ×2 IMPLANT
DRAPE NEUROLOGICAL W/INCISE (DRAPES) ×3 IMPLANT
DRAPE SHEET LG 3/4 BI-LAMINATE (DRAPES) ×6 IMPLANT
DRAPE SURG 17X23 STRL (DRAPES) IMPLANT
DRAPE WARM FLUID 44X44 (DRAPE) ×3 IMPLANT
DRSG MEPILEX BORDER 4X8 (GAUZE/BANDAGES/DRESSINGS) ×2 IMPLANT
ELECT REM PT RETURN 9FT ADLT (ELECTROSURGICAL) ×3
ELECTRODE REM PT RTRN 9FT ADLT (ELECTROSURGICAL) ×1 IMPLANT
EVACUATOR 1/8 PVC DRAIN (DRAIN) IMPLANT
EVACUATOR SILICONE 100CC (DRAIN) IMPLANT
GAUZE SPONGE 4X4 12PLY STRL (GAUZE/BANDAGES/DRESSINGS) ×3 IMPLANT
GAUZE SPONGE 4X4 16PLY XRAY LF (GAUZE/BANDAGES/DRESSINGS) IMPLANT
GLOVE BIO SURGEON STRL SZ7 (GLOVE) IMPLANT
GLOVE BIOGEL PI IND STRL 7.0 (GLOVE) IMPLANT
GLOVE BIOGEL PI IND STRL 7.5 (GLOVE) ×1 IMPLANT
GLOVE BIOGEL PI INDICATOR 7.0 (GLOVE)
GLOVE BIOGEL PI INDICATOR 7.5 (GLOVE) ×2
GLOVE EXAM NITRILE LRG STRL (GLOVE) ×3 IMPLANT
GLOVE EXAM NITRILE XL STR (GLOVE) IMPLANT
GLOVE EXAM NITRILE XS STR PU (GLOVE) IMPLANT
GLOVE SS BIOGEL STRL SZ 7.5 (GLOVE) ×2 IMPLANT
GLOVE SUPERSENSE BIOGEL SZ 7.5 (GLOVE) ×4
GLOVE SURG SS PI 7.5 STRL IVOR (GLOVE) ×4 IMPLANT
GOWN STRL REUS W/ TWL LRG LVL3 (GOWN DISPOSABLE) ×1 IMPLANT
GOWN STRL REUS W/ TWL XL LVL3 (GOWN DISPOSABLE) IMPLANT
GOWN STRL REUS W/TWL 2XL LVL3 (GOWN DISPOSABLE) IMPLANT
GOWN STRL REUS W/TWL LRG LVL3 (GOWN DISPOSABLE) ×3
GOWN STRL REUS W/TWL XL LVL3 (GOWN DISPOSABLE)
HEMOSTAT POWDER KIT SURGIFOAM (HEMOSTASIS) ×5 IMPLANT
HEMOSTAT SURGICEL 2X14 (HEMOSTASIS) ×2 IMPLANT
HOOK DURA 1/2IN (MISCELLANEOUS) ×2 IMPLANT
KIT BASIN OR (CUSTOM PROCEDURE TRAY) ×3 IMPLANT
KIT ROOM TURNOVER OR (KITS) ×3 IMPLANT
NDL HYPO 21X1.5 SAFETY (NEEDLE) ×1 IMPLANT
NDL HYPO 25X1 1.5 SAFETY (NEEDLE) ×1 IMPLANT
NEEDLE HYPO 21X1.5 SAFETY (NEEDLE) ×3 IMPLANT
NEEDLE HYPO 25X1 1.5 SAFETY (NEEDLE) ×3 IMPLANT
NS IRRIG 1000ML POUR BTL (IV SOLUTION) ×6 IMPLANT
OIL CARTRIDGE MAESTRO DRILL (MISCELLANEOUS) ×3
PACK CRANIOTOMY CUSTOM (CUSTOM PROCEDURE TRAY) ×3 IMPLANT
PATTIES SURGICAL .5 X.5 (GAUZE/BANDAGES/DRESSINGS) IMPLANT
PATTIES SURGICAL .5 X3 (DISPOSABLE) IMPLANT
PATTIES SURGICAL .5X1.5 (GAUZE/BANDAGES/DRESSINGS) IMPLANT
PATTIES SURGICAL 1X1 (DISPOSABLE) IMPLANT
PERFORATOR CRANIAL CHILD/PED (MISCELLANEOUS) IMPLANT
PERFORATOR LRG  14-11MM (BIT) ×2
PERFORATOR LRG 14-11MM (BIT) IMPLANT
PIN MAYFIELD SKULL DISP (PIN) ×3 IMPLANT
PLATE 1.5  2HOLE LNG NEURO (Plate) ×2 IMPLANT
PLATE 1.5 2HOLE LNG NEURO (Plate) IMPLANT
PLATE 1.5/0.5 18.5MM BURR HOLE (Plate) ×4 IMPLANT
RUBBERBAND STERILE (MISCELLANEOUS) ×4 IMPLANT
SCREW SELF DRILL HT 1.5/4MM (Screw) ×20 IMPLANT
SPONGE LAP 18X18 X RAY DECT (DISPOSABLE) ×1 IMPLANT
SPONGE NEURO XRAY DETECT 1X3 (DISPOSABLE) IMPLANT
SPONGE SURGIFOAM ABS GEL 100 (HEMOSTASIS) ×3 IMPLANT
SPONGE SURGIFOAM ABS GEL 100C (HEMOSTASIS) ×3 IMPLANT
STAPLER VISISTAT 35W (STAPLE) ×3 IMPLANT
STOCKINETTE 6  STRL (DRAPES) ×2
STOCKINETTE 6 STRL (DRAPES) ×1 IMPLANT
STRIP SURGICAL 1 X 6 IN (GAUZE/BANDAGES/DRESSINGS) IMPLANT
STRIP SURGICAL 1/2 X 6 IN (GAUZE/BANDAGES/DRESSINGS) IMPLANT
STRIP SURGICAL 1/4 X 6 IN (GAUZE/BANDAGES/DRESSINGS) IMPLANT
STRIP SURGICAL 3/4 X 6 IN (GAUZE/BANDAGES/DRESSINGS) IMPLANT
SUT ETHILON 3 0 FSL (SUTURE) IMPLANT
SUT ETHILON 3 0 PS 1 (SUTURE) IMPLANT
SUT NURALON 4 0 TR CR/8 (SUTURE) ×6 IMPLANT
SUT VIC AB 0 CT1 18XCR BRD8 (SUTURE) ×2 IMPLANT
SUT VIC AB 0 CT1 8-18 (SUTURE) ×6
SUT VIC AB 2-0 CT1 18 (SUTURE) ×8 IMPLANT
SYR 30ML LL (SYRINGE) ×4 IMPLANT
TOWEL GREEN STERILE (TOWEL DISPOSABLE) ×3 IMPLANT
TOWEL GREEN STERILE FF (TOWEL DISPOSABLE) ×3 IMPLANT
TRAY FOLEY W/METER SILVER 16FR (SET/KITS/TRAYS/PACK) ×1 IMPLANT
TUBE CONNECTING 12'X1/4 (SUCTIONS) ×1
TUBE CONNECTING 12X1/4 (SUCTIONS) ×2 IMPLANT
UNDERPAD 30X30 (UNDERPADS AND DIAPERS) ×3 IMPLANT
WATER STERILE IRR 1000ML POUR (IV SOLUTION) ×3 IMPLANT

## 2017-03-03 NOTE — Consult Note (Signed)
PULMONARY / CRITICAL CARE MEDICINE   Name: Paul Odonnell MRN: [id number] DOB: [birth year]    ADMISSION DATE:  03/01/2017 CONSULTATION DATE: 03/21/2017  REFERRING MD:  Dr. Cyndy Freeze, Neurosurgery  CHIEF COMPLAINT:  Weakness  HISTORY OF PRESENT ILLNESS:   84 yo male former smoker found at home in bathroom and unable to get up.  Noted to have Lt sided weakness.  He progressed to aphasia and started vomiting.  BP in ER was 221/98.  He required intubation for airway protection.  CT head showed large ICH with subdural extension.  The family was agreeable to short term vent support, but otherwise patient DNR.  He went for craniotomy with evacuation of hematoma.  PCCM asked to assist with vent/medical management.  PAST MEDICAL HISTORY :  He  has a past medical history of AAA (abdominal aortic aneurysm) ([place]), Anemia, Asthma, BPH (benign prostatic hyperplasia), CAD (coronary artery disease), Depression, Diverticulosis, DM (diabetes mellitus) ([place]), Gastritis, GERD (gastroesophageal reflux disease), HH (hiatus hernia), HTN (hypertension), Hypothyroidism, IBS (irritable bowel syndrome), MI (myocardial infarction) ([place]), Osteoarthritis, Pancreatitis, Peptic stricture of esophagus, Prostate cancer ([place]), Pure hypercholesterolemia, Renal insufficiency, Sepsis due to Pseudomonas ([place]), Urinary obstruction, and Vitamin B 12 deficiency.  PAST SURGICAL HISTORY: He  has a past surgical history that includes Coronary angioplasty with stent.  Allergies  Allergen Reactions  . Codeine Other (See Comments)    Pancreatic problems   . Dilaudid [Hydromorphone Hcl] Other (See Comments)    Affects patient's thinking  . Morphine And Related Other (See Comments)    Affects patient's thinking    No current facility-administered medications on file prior to encounter.    Current Outpatient Medications on File Prior to Encounter  Medication Sig  . Ascorbic Acid (VITAMIN C) 1000 MG tablet Take 1,000 mg by mouth daily.  Marland Kitchen  aspirin 325 MG tablet Take 325 mg by mouth daily.  Marland Kitchen atorvastatin (LIPITOR) 40 MG tablet take 1/2 tablet by mouth once daily  . b complex vitamins tablet Take 1 tablet by mouth daily.  . Cholecalciferol (VITAMIN D PO) Take 1 tablet by mouth daily.  Marland Kitchen CREON 36000 units CPEP capsule Take 36,000 Units by mouth 3 (three) times daily.  Marland Kitchen docusate sodium (COLACE) 100 MG capsule Take 200 mg by mouth 2 (two) times daily.  Marland Kitchen escitalopram (LEXAPRO) 10 MG tablet Take 10 mg by mouth daily.  . feeding supplement (BOOST / RESOURCE BREEZE) LIQD Take 1 Container by mouth 3 (three) times daily between meals.  . gabapentin (NEURONTIN) 100 MG capsule Take 100 mg by mouth 2 (two) times daily.  Marland Kitchen levothyroxine (SYNTHROID, LEVOTHROID) 50 MCG tablet Take 50 mcg by mouth daily before breakfast.   . mirtazapine (REMERON) 15 MG tablet Take 1 tablet (15 mg total) by mouth at bedtime.  . OXYCONTIN 20 MG 12 hr tablet Take 20 mg by mouth every morning.   Marland Kitchen QUEtiapine (SEROQUEL) 25 MG tablet Take 1 tablet (25 mg total) by mouth at bedtime.  . vitamin E 100 UNIT capsule Take 100 Units by mouth daily.    FAMILY HISTORY:  His indicated that his mother is deceased. He indicated that his father is deceased. He indicated that the status of his brother is unknown. He indicated that his maternal grandmother is deceased. He indicated that his maternal grandfather is deceased. He indicated that his paternal grandmother is deceased. He indicated that his paternal grandfather is deceased.   SOCIAL HISTORY: He  reports that he quit smoking about 10 months ago. His smoking  use included cigarettes. He has quit using smokeless tobacco. He reports that he does not drink alcohol or use drugs.  REVIEW OF SYSTEMS:   Unable to obtain  SUBJECTIVE:   VITAL SIGNS: BP 110/67   Pulse 77   Temp (!) 93 F (33.9 C)   Resp 16   Ht 5\' 10"  (1.778 m)   Wt 159 lb 9.8 oz (72.4 kg)   SpO2 100%   BMI 22.90 kg/m   VENTILATOR SETTINGS: Vent  Mode: PRVC FiO2 (%):  [50 %-100 %] 50 % Set Rate:  [16 bmp] 16 bmp Vt Set:  [600 mL] 600 mL PEEP:  [5 cmH20] 5 cmH20 Plateau Pressure:  [16 cmH20-17 cmH20] 16 cmH20  INTAKE / OUTPUT: No intake/output data recorded.  PHYSICAL EXAMINATION:  General - sedated, recovering from anesthesia Eyes - Lt pupil larger than Rt ENT - ETT and OG tubes in place Cardiac - regular, no murmur Chest - decreased BS at bases Rt > Lt, no wheeze Abd - soft, non tender Ext - no edema Skin - no rashes Neuro - not following commands   LABS:  BMET Recent Labs  Lab 03/02/2017 0928  NA 143  K 3.1*  CL 107  CO2 23  BUN 12  CREATININE 1.61*  GLUCOSE 143*    Electrolytes Recent Labs  Lab 02/24/2017 0928  CALCIUM 9.0    CBC Recent Labs  Lab 02/23/2017 0928  WBC 6.1  HGB 10.7*  HCT 32.7*  PLT 168    Coag's Recent Labs  Lab 03/20/2017 0928  APTT 25  INR 1.10    Sepsis Markers No results for input(s): LATICACIDVEN, PROCALCITON, O2SATVEN in the last 168 hours.  ABG Recent Labs  Lab 03/09/2017 1137 03/09/2017 1527  PHART 7.334* 7.336*  PCO2ART 49.1* 37.4  PO2ART 375.0* 163.0*    Liver Enzymes Recent Labs  Lab 03/06/2017 0928  AST 30  ALT 19  ALKPHOS 118  BILITOT 0.5  ALBUMIN 3.5    Cardiac Enzymes No results for input(s): TROPONINI, PROBNP in the last 168 hours.  Glucose No results for input(s): GLUCAP in the last 168 hours.  Imaging Ct Head Wo Contrast  Result Date: 03/04/2017 CLINICAL DATA:  Fall in bathroom.  Fixed gaze. EXAM: CT HEAD WITHOUT CONTRAST TECHNIQUE: Contiguous axial images were obtained from the base of the skull through the vertex without intravenous contrast. COMPARISON:  10/25/2011 FINDINGS: Brain: Large hemorrhage dissecting through the left frontal lobe. Anteriorly there is more homogeneous high-density and posteriorly there is clot that is layering. Intervening low-density is likely un clotted blood. The total hemorrhagic area, with presumed interposed  parenchyma, is 8 x 5 x 5 cm ( 100 cc). There is subdural extension locally with mixed density clot extending posteriorly measuring up to 13 mm where discretely seen along the mid sylvian fissure. Along the frontal pole, it is difficult to distinguish extra-axial from intra-axial blood. Midline shift measures up to 18 mm. There is edema about the hemorrhage which extends into the left temporal pole. No definite gray matter involvement/infarct the level of the temporal pole. Reportedly the patient had a fall and this may traumatic. Hemorrhagic infarct, hypertension, hemorrhagic mass, coagulopathy, or amyloid angiopathy are additional diagnostic considerations. No entrapment. Chronic small vessel ischemic type change in the cerebral white matter. Vascular: Atherosclerotic calcification. Skull: No acute finding. Sinuses/Orbits: No traumatic findings. Other: Critical Value/emergent results were called by telephone at the time of interpretation on 02/26/2017 at 10:39 am to Dr. Nanda Quinton , who is  already aware. IMPRESSION: Large left frontal parenchymal hemorrhage measuring up to 100 cc. There is subdural extension along the left cerebral convexity. Regional edema with midline shift measuring up to 18 mm. Electronically Signed   By: Monte Fantasia M.D.   On: 02/22/2017 10:41   Dg Chest Portable 1 View  Result Date: 02/22/2017 CLINICAL DATA:  ET tube placement EXAM: PORTABLE CHEST 1 VIEW COMPARISON:  03/23/2016 FINDINGS: Endotracheal tube is 4 cm above the carina. NG tube is in the stomach. Mild elevation of the right hemidiaphragm with right base atelectasis. No confluent opacity on the left. Heart is normal size. No effusions. IMPRESSION: Elevated right hemidiaphragm with right base atelectasis. Endotracheal tube 4 cm above the carina. Electronically Signed   By: Rolm Baptise M.D.   On: 03/16/2017 10:51   Dg Abd Portable 1 View  Result Date: 03/06/2017 CLINICAL DATA:  OG tube placement EXAM: PORTABLE ABDOMEN - 1  VIEW COMPARISON:  None. FINDINGS: OG tube tip is in the distal stomach. No evidence for bowel obstruction, free air organomegaly. IMPRESSION: OG tube tip in the distal stomach. Electronically Signed   By: Rolm Baptise M.D.   On: 02/28/2017 10:52     STUDIES:  CT head 2/10 >> Lt frontal hemorrhage up to 100 cc, regional edema with 18 mm shift  CULTURES:  ANTIBIOTICS: Ancef 2/10 >>   SIGNIFICANT EVENTS: 2/10 Admit, craniotomy  LINES/TUBES: ETT 2/10 >>  Rt radial Aline 2/10 >>   DISCUSSION: 84 yo male with ICH likely from HTN emergency.  Intubated for airway protection.  S/p craniotomy.  Family okay with short term support, but DNR otherwise.  ASSESSMENT / PLAN:  Intracerebral hemorrhage s/p craniotomy. Hx of mild dementia, depression, chronic pain. - post op care, AEDs, f/u neuro imaging per neurosurgery - RASS goal 0 to -1 - continue lexapro - hold neurontin, remeron, seroquel, oxycontin  Acute respiratory failure with compromised airway. - full vent support - f/u CXR  HTN emergency. Hx of CAD s/p DES to LAD March 2009, HLD. - cardene gtt as needed to keep SBP < 140 - continue lipitor - hold ASA  Hx of pancreatitis. - continue creon  Hx of hypothyroidism. - continue synthroid  DVT prophylaxis - SCDs SUP - Protonix Nutrition - NPO Goals of care - DNR  CC time 32 minutes  Chesley Mires, MD [place] 03/14/2017, 4:31 PM Pager:  [phone] After 3pm call: [phone]

## 2017-03-03 NOTE — Anesthesia Procedure Notes (Signed)
Arterial Line Insertion Start/End02/16/2019 1:10 PM, 03/03/2017 1:10 PM  Patient location: Pre-op. Preanesthetic checklist: patient identified, IV checked, site marked, risks and benefits discussed, surgical consent, monitors and equipment checked, pre-op evaluation, timeout performed and anesthesia consent Lidocaine 1% used for infiltration Left, radial was placed Catheter size: 20 Fr Hand hygiene performed  and maximum sterile barriers used   Attempts: 1 Procedure performed without using ultrasound guided technique. Following insertion, dressing applied. Post procedure assessment: normal and unchanged

## 2017-03-03 NOTE — Consult Note (Signed)
Chief Complaint   Chief Complaint  Patient presents with  . Cerebrovascular Accident  . Weakness    HPI   HPI: Paul Odonnell is a 84 y.o. male brought to ER as code stroke. Patient currently intubated. History given by wife and neighbor. Wife was getting ready this am when she heard a loud noise. Husband subsequently found on be bathroom floor. Wife called EMS and neighbor. EMS assessment by report noted left sided weakness. Upon arrival to ER, he was communicative. Not oriented, but has history of dementia. Symptoms essentially resolved. Went for head CT. While in CT he became aphasic and started vomiting. Subsequently became unresponsive requiring intubation.  Multiple medical issues as below. On daily ASA but no other anti-coag. History of mild dementia. At baseline, communicative, cares for self, lives with wife at home.  Patient Active Problem List   Diagnosis Date Noted  . Acute encephalopathy 03/25/2016  . Acute on chronic pancreatitis ([place]) 03/25/2016  . Acute on chronic kidney failure ([place]) 03/23/2016  . AAA (abdominal aortic aneurysm) ([place]) 03/23/2016  . Dissection of abdominal aorta ([place])   . Acute right-sided low back pain with sciatica   . Aortic dissection ([place]) 02/19/2016  . CKD (chronic kidney disease) 04/23/2014  . Chronic abdominal pain 04/23/2014  . CAP (community acquired pneumonia) 04/22/2014  . Coronary artery disease involving native coronary artery of native heart without angina pectoris 08/05/2013  . Essential hypertension 08/05/2013  . Hyperlipidemia 08/05/2013  . Old MI (myocardial infarction) 08/05/2013  . Blunt abdominal trauma 10/26/2011  . Blunt chest trauma 10/26/2011  . Acute blood loss anemia 10/26/2011  . Chronic anemia 10/26/2011  . Fracture of iliac wing ([place]) 10/25/2011  . Multiple rib fractures 10/25/2011  . Adrenal hematoma 10/25/2011  . Pneumomediastinum ([place]) 10/25/2011    PMH: Past Medical History:  Diagnosis Date  . AAA  (abdominal aortic aneurysm) ([place])   . Anemia   . Asthma   . BPH (benign prostatic hyperplasia)   . CAD (coronary artery disease)   . Depression   . Diverticulosis   . DM (diabetes mellitus) ([place])   . Gastritis   . GERD (gastroesophageal reflux disease)   . HH (hiatus hernia)   . HTN (hypertension)   . Hypothyroidism   . IBS (irritable bowel syndrome)   . MI (myocardial infarction) ([place])   . Osteoarthritis   . Pancreatitis    with pancreas divisum  . Peptic stricture of esophagus   . Prostate cancer ([place])    radiation 2011  . Pure hypercholesterolemia   . Renal insufficiency   . Sepsis due to Pseudomonas ([place])    pyelonephritis  . Urinary obstruction   . Vitamin B 12 deficiency     PSH: Past Surgical History:  Procedure Laterality Date  . CORONARY ANGIOPLASTY WITH STENT PLACEMENT       (Not in a hospital admission)  SH: Social History   Tobacco Use  . Smoking status: Former Smoker    Types: Cigarettes    Last attempt to quit: 04/21/2016    Years since quitting: 0.8  . Smokeless tobacco: Former Network engineer Use Topics  . Alcohol use: No  . Drug use: No    MEDS: Prior to Admission medications   Medication Sig Start Date End Date Taking? Authorizing Provider  Ascorbic Acid (VITAMIN C) 1000 MG tablet Take 1,000 mg by mouth daily.    [provider]  aspirin 325 MG tablet Take 325 mg by mouth daily.  [provider]  atorvastatin (LIPITOR) 40 MG tablet take 1/2 tablet by mouth once daily 01/07/17   Jerline Pain, MD  b complex vitamins tablet Take 1 tablet by mouth daily.    [provider]  Cholecalciferol (VITAMIN D PO) Take 1 tablet by mouth daily.    [provider]  CREON 36000 units CPEP capsule Take 36,000 Units by mouth 3 (three) times daily. 03/20/16   [provider]  docusate sodium (COLACE) 100 MG capsule Take 200 mg by mouth 2 (two) times daily.    [provider]  escitalopram (LEXAPRO) 10  MG tablet Take 10 mg by mouth daily.    [provider]  feeding supplement (BOOST / RESOURCE BREEZE) LIQD Take 1 Container by mouth 3 (three) times daily between meals. 03/26/16   Lavina Hamman, MD  gabapentin (NEURONTIN) 100 MG capsule Take 100 mg by mouth 2 (two) times daily. 01/25/16   [provider]  levothyroxine (SYNTHROID, LEVOTHROID) 50 MCG tablet Take 50 mcg by mouth daily before breakfast.     [provider]  mirtazapine (REMERON) 15 MG tablet Take 1 tablet (15 mg total) by mouth at bedtime. 03/26/16   Lavina Hamman, MD  OXYCONTIN 20 MG 12 hr tablet Take 20 mg by mouth every morning.  01/25/16   [provider]  QUEtiapine (SEROQUEL) 25 MG tablet Take 1 tablet (25 mg total) by mouth at bedtime. 03/26/16   Lavina Hamman, MD  vitamin E 100 UNIT capsule Take 100 Units by mouth daily.    [provider]    ALLERGY: Allergies  Allergen Reactions  . Codeine Other (See Comments)    Pancreatic problems   . Dilaudid [Hydromorphone Hcl] Other (See Comments)    Affects patient's thinking  . Morphine And Related Other (See Comments)    Affects patient's thinking    Social History   Tobacco Use  . Smoking status: Former Smoker    Types: Cigarettes    Last attempt to quit: 04/21/2016    Years since quitting: 0.8  . Smokeless tobacco: Former Network engineer Use Topics  . Alcohol use: No     Family History  Problem Relation Age of Onset  . Diabetes Mother   . Heart attack Father   . Heart attack Brother        x 2 brothers     ROS   ROS unable to obtain; intubated  Exam   Vitals:   03/15/2017 1105 03/10/2017 1110  BP: (!) 160/69 (!) 157/72  Pulse: 74 83  Resp:    Temp:    SpO2: 99% 99%   Intubated Pupils symmetric, sluggish response Flexes LE R>L to painful stimulus No UE movement to painful stimulus - gag, - corneal  Results - Imaging/Labs   Results for orders placed or performed during the hospital encounter of  03/16/2017 (from the past 48 hour(s))  Ethanol     Status: None   Collection Time: 03/15/2017  9:28 AM  Result Value Ref Range   Alcohol, Ethyl (B) <10 <10 mg/dL    Comment:        LOWEST DETECTABLE LIMIT FOR SERUM ALCOHOL IS 10 mg/dL FOR MEDICAL PURPOSES ONLY Performed at [facility], [address], [place], [place] [address]   Protime-INR     Status: None   Collection Time: 03/08/2017  9:28 AM  Result Value Ref Range   Prothrombin Time 14.1 11.4 - 15.2 seconds  INR 1.10     Comment: Performed at [facility], [place] [address], [place], [place] [address]  APTT     Status: None   Collection Time: 03/06/2017  9:28 AM  Result Value Ref Range   aPTT 25 24 - 36 seconds    Comment: Performed at [facility] [address], [place], [place] [address]  CBC     Status: Abnormal   Collection Time: 03/02/2017  9:28 AM  Result Value Ref Range   WBC 6.1 4.0 - 10.5 K/uL   RBC 3.36 (L) 4.22 - 5.81 MIL/uL   Hemoglobin 10.7 (L) 13.0 - 17.0 g/dL   HCT 32.7 (L) 39.0 - 52.0 %   MCV 97.3 78.0 - 100.0 fL   MCH 31.8 26.0 - 34.0 pg   MCHC 32.7 30.0 - 36.0 g/dL   RDW 13.5 11.5 - 15.5 %   Platelets 168 150 - 400 K/uL    Comment: Performed at [facility], [place] [address], [place], [place] [address]  Differential     Status: None   Collection Time: 03/13/2017  9:28 AM  Result Value Ref Range   Neutrophils Relative % 57 %   Neutro Abs 3.4 1.7 - 7.7 K/uL   Lymphocytes Relative 33 %   Lymphs Abs 2.1 0.7 - 4.0 K/uL   Monocytes Relative 8 %   Monocytes Absolute 0.5 0.1 - 1.0 K/uL   Eosinophils Relative 2 %   Eosinophils Absolute 0.1 0.0 - 0.7 K/uL   Basophils Relative 0 %   Basophils Absolute 0.0 0.0 - 0.1 K/uL    Comment: Performed at [facility] [address] [place], [place] [address]  Comprehensive metabolic panel     Status: Abnormal   Collection Time: 03/11/2017  9:28 AM  Result Value Ref Range   Sodium 143 135 - 145 mmol/L   Potassium 3.1 (L) 3.5 - 5.1 mmol/L    Chloride 107 101 - 111 mmol/L   CO2 23 22 - 32 mmol/L   Glucose, Bld 143 (H) 65 - 99 mg/dL   BUN 12 6 - 20 mg/dL   Creatinine, Ser 1.61 (H) 0.61 - 1.24 mg/dL   Calcium 9.0 8.9 - 10.3 mg/dL   Total Protein 6.8 6.5 - 8.1 g/dL   Albumin 3.5 3.5 - 5.0 g/dL   AST 30 15 - 41 U/L   ALT 19 17 - 63 U/L   Alkaline Phosphatase 118 38 - 126 U/L   Total Bilirubin 0.5 0.3 - 1.2 mg/dL   GFR calc non Af Amer 38 (L) >60 mL/min   GFR calc Af Amer 44 (L) >60 mL/min    Comment: (NOTE) The eGFR has been calculated using the CKD EPI equation. This calculation has not been validated in all clinical situations. eGFR's persistently <60 mL/min signify possible Chronic Kidney Disease.    Anion gap 13 5 - 15    Comment: Performed at [facility] [address], [place],  [address]  I-stat troponin, ED     Status: None   Collection Time: 02/25/2017  9:45 AM  Result Value Ref Range   Troponin i, poc 0.00 0.00 - 0.08 ng/mL   Comment 3            Comment: Due to the release kinetics of cTnI, a negative result within the first hours of the onset of symptoms does not rule out myocardial infarction with certainty. If myocardial infarction is still suspected,  repeat the test at appropriate intervals.     Ct Head Wo Contrast  Result Date: 03/13/2017 CLINICAL DATA:  Fall in bathroom.  Fixed gaze. EXAM: CT HEAD WITHOUT CONTRAST TECHNIQUE: Contiguous axial images were obtained from the base of the skull through the vertex without intravenous contrast. COMPARISON:  10/25/2011 FINDINGS: Brain: Large hemorrhage dissecting through the left frontal lobe. Anteriorly there is more homogeneous high-density and posteriorly there is clot that is layering. Intervening low-density is likely un clotted blood. The total hemorrhagic area, with presumed interposed parenchyma, is 8 x 5 x 5 cm ( 100 cc). There is subdural extension locally with mixed density clot extending posteriorly measuring up to 13 mm where discretely  seen along the mid sylvian fissure. Along the frontal pole, it is difficult to distinguish extra-axial from intra-axial blood. Midline shift measures up to 18 mm. There is edema about the hemorrhage which extends into the left temporal pole. No definite gray matter involvement/infarct the level of the temporal pole. Reportedly the patient had a fall and this may traumatic. Hemorrhagic infarct, hypertension, hemorrhagic mass, coagulopathy, or amyloid angiopathy are additional diagnostic considerations. No entrapment. Chronic small vessel ischemic type change in the cerebral white matter. Vascular: Atherosclerotic calcification. Skull: No acute finding. Sinuses/Orbits: No traumatic findings. Other: Critical Value/emergent results were called by telephone at the time of interpretation on 02/25/2017 at 10:39 am to Dr. Nanda Quinton , who is already aware. IMPRESSION: Large left frontal parenchymal hemorrhage measuring up to 100 cc. There is subdural extension along the left cerebral convexity. Regional edema with midline shift measuring up to 18 mm. Electronically Signed   By: Monte Fantasia M.D.   On: 03/02/2017 10:41   Dg Chest Portable 1 View  Result Date: 03/16/2017 CLINICAL DATA:  ET tube placement EXAM: PORTABLE CHEST 1 VIEW COMPARISON:  03/23/2016 FINDINGS: Endotracheal tube is 4 cm above the carina. NG tube is in the stomach. Mild elevation of the right hemidiaphragm with right base atelectasis. No confluent opacity on the left. Heart is normal size. No effusions. IMPRESSION: Elevated right hemidiaphragm with right base atelectasis. Endotracheal tube 4 cm above the carina. Electronically Signed   By: Rolm Baptise M.D.   On: 03/19/2017 10:51   Dg Abd Portable 1 View  Result Date: 03/13/2017 CLINICAL DATA:  OG tube placement EXAM: PORTABLE ABDOMEN - 1 VIEW COMPARISON:  None. FINDINGS: OG tube tip is in the distal stomach. No evidence for bowel obstruction, free air organomegaly. IMPRESSION: OG tube tip in  the distal stomach. Electronically Signed   By: Rolm Baptise M.D.   On: 03/17/2017 10:52     Impression/Plan   84 y.o. male with large intraparenchymal hemorrhage with subdural extension. Neuro exam poor, but was just given meds for intubation so difficult to get accurate assessment. I have reviewed the case with attending Dr Cyndy Freeze who has also reviewed the imaging. We can certainly proceed with craniotomy for evacuation of hematoma in the hopes of relieving some pressure on the brain, but there is no way to predict the damage that already has been done. I had a long discussion with wife & neighbor with chaplain in room. We discussed patient's current state of health and his prognosis. We discussed surgical intervention vs. no surgical intervention. We discussed risks/benefits of each including but not limited to palliative care, anesthesia risks (blood clot, CVA, MI), reaccumulation of blood even if surgery successful, inability to talk to damage to Broca's area. Patient previously was DNI, but she believes if  there was a chance surgery would help, he would want [place] to proceed. We discussed the surgery at length including details of surgery and post operative care. We discussed there is no gurantee for benefit from surgery, again there is no way to tell what damage has already been done and what long term effects he will have. Wife has elected to proceed with left frontal craniotomy for evacuation of hematoma. All questions were answers. We will perform surgery one time. In the event there is reaccumulation, at the best interest of the patient, we will not proceed with a second surgery. Wife states understanding and is agreeable to this. Case has been posted. OR team has been called.

## 2017-03-03 NOTE — ED Notes (Signed)
Pt taken back to room at completion of scan, MD to discuss intubation/resucitation efforts with family. Pt unresponsive. RT notified for possible need of assistance with intubation

## 2017-03-03 NOTE — Progress Notes (Signed)
Paged CCM in regards to glucose level on labs and to request SSI, awaiting reply. Will monitor.   Prescilla Sours, Therapist, sports

## 2017-03-03 NOTE — ED Notes (Signed)
Pt vomited x1. Airway remains intact.

## 2017-03-03 NOTE — Anesthesia Postprocedure Evaluation (Signed)
Anesthesia Post Note  [name]  Procedure(s) Performed: CRANIOTOMY HEMATOMA EVACUATION SUBDURAL/LEFT FRONTAL (Left Head)     Patient location during evaluation: ICU Anesthesia Type: General Level of consciousness: awake Pain management: pain level controlled Vital Signs Assessment: post-procedure vital signs reviewed and stable Respiratory status: spontaneous breathing and patient remains intubated per anesthesia plan Cardiovascular status: stable Anesthetic complications: no    Last Vitals:  Vitals:   02/24/2017 1145 03/10/2017 1200  BP: 129/60 (!) 108/53  Pulse: 92 78  Resp:    Temp:    SpO2: 99% 99%    Last Pain:  Vitals:   03/06/2017 0916  TempSrc: Oral                 [place] Masaki

## 2017-03-03 NOTE — ED Notes (Signed)
Chaplain accompanying family to consultation room with neurosurgery PA.

## 2017-03-03 NOTE — Procedures (Signed)
Intubation Procedure Note Paul Odonnell [id number] Nov 29, 1930  Procedure: Intubation Indications: Airway protection and maintenance  Procedure Details Consent: Risks of procedure as well as the alternatives and risks of each were explained to the (patient/caregiver).  Consent for procedure obtained. Time Out: Verified patient identification, verified procedure, site/side was marked, verified correct patient position, special equipment/implants available, medications/allergies/relevent history reviewed, required imaging and test results available.  Performed  Maximum sterile technique was used including gloves, gown, hand hygiene and mask.  MAC and 4    Evaluation Hemodynamic Status: BP stable throughout; O2 sats: stable throughout Patient's Current Condition: stable Complications: No apparent complications Patient did tolerate procedure well. Chest X-ray ordered to verify placement.  CXR: pending.  Pt intubated using glidescope #4 blade with 7.5 ett secured at 24 at the lip on 1st attempt. Bilateral BS, Positive color change noted on etco2, direct visualization, CXR pending.    Jesse Sans 03/12/2017

## 2017-03-03 NOTE — Progress Notes (Signed)
Wife at bedside requests for patient to be a DNR, reports that she understands that this means no lifesaving measures in the event of cardiac arrest. Additional family member that is a Family Medicine MD at bedside as well and endorses. MD notified of request.   Prescilla Sours, RN

## 2017-03-03 NOTE — ED Notes (Signed)
Family at bedside. 

## 2017-03-03 NOTE — ED Triage Notes (Signed)
Per EMS patient found by side of toilet this am and could not get up. Patient had left sided weakness and deficits when initially examined by EMS and left arm drift. Patient denies any complains or deficits and per patient he has slight dementia.

## 2017-03-03 NOTE — Anesthesia Preprocedure Evaluation (Addendum)
Anesthesia Evaluation  Patient identified by MRN, date of birth, ID band Patient unresponsive  General Assessment Comment:History noted. CGPreop documentation limited or incomplete due to emergent nature of procedure.  Airway Mallampati: Intubated       Dental   Pulmonary asthma , pneumonia, former smoker,    breath sounds clear to auscultation       Cardiovascular hypertension, + CAD, + Past MI and + Peripheral Vascular Disease   Rhythm:Regular Rate:Normal     Neuro/Psych    GI/Hepatic hiatal hernia, GERD  ,  Endo/Other  diabetesHypothyroidism   Renal/GU Renal disease     Musculoskeletal   Abdominal   Peds  Hematology   Anesthesia Other Findings   Reproductive/Obstetrics                            Anesthesia Physical Anesthesia Plan  ASA: IV  Anesthesia Plan: General   Post-op Pain Management:    Induction: Intravenous  PONV Risk Score and Plan: 2 and Treatment may vary due to age or medical condition  Airway Management Planned: Oral ETT  Additional Equipment:   Intra-op Plan:   Post-operative Plan: Post-operative intubation/ventilation  Informed Consent:   Plan Discussed with: CRNA, Anesthesiologist and Surgeon  Anesthesia Plan Comments:        Anesthesia Quick Evaluation

## 2017-03-03 NOTE — ED Notes (Signed)
ED Provider at bedside. 

## 2017-03-03 NOTE — Progress Notes (Signed)
   03/02/2017 1300  Clinical Encounter Type  Visited With Family  Visit Type Spiritual support  Referral From Nurse  Consult/Referral To Chaplain  Spiritual Encounters  Spiritual Needs Prayer;Emotional  Stress Factors  Family Stress Factors Health changes  Chaplain was called to the PT room when in C20 for consult with the wife.  PT health had taken a turn.  Chaplain spoke with wife that wanted prayer with the PT.  Chaplain assisted PA in going to consult room with family.  Wife of PT talked about history of illnesses or injuries PT has had.  We discussed his toughness and well as times of love they have shared together.  Family taken to 2N waiting area for surgery

## 2017-03-03 NOTE — ED Provider Notes (Signed)
Emergency Department Provider Note   I have reviewed the triage vital signs and the nursing notes.   HISTORY  Chief Complaint Cerebrovascular Accident and Weakness   HPI Paul Odonnell is a 84 y.o. male with PMH of AAA, anemia, CAD, DM, HTN, OA, HLD, and early stage dementia presents to the emergency department for evaluation after a fall in the bathroom.  The patient's wife states that they have separate bathrooms and she heard a loud noise coming from his bathroom this morning as if someone had fallen.  She went to check on him but was unable to completely open the door.  The patient landed on his right side initially not speaking in response to family talking to him.  EMS was called and they were able to get into the bathroom.  They report left sided weakness in the face, arm, leg which they described as "flaccid" with significant left arm drift. No reported speech abnormalities by patient, family, or EMS.  Patient denies any vision disturbance.  He does not recall the events leading to his fall.  Family state that the patient has some early dementia.   Level 5 caveat: Dementia    Past Medical History:  Diagnosis Date  . AAA (abdominal aortic aneurysm) ([place])   . Anemia   . Asthma   . BPH (benign prostatic hyperplasia)   . CAD (coronary artery disease)   . Depression   . Diverticulosis   . DM (diabetes mellitus) ([place])   . Gastritis   . GERD (gastroesophageal reflux disease)   . HH (hiatus hernia)   . HTN (hypertension)   . Hypothyroidism   . IBS (irritable bowel syndrome)   . MI (myocardial infarction) ([place])   . Osteoarthritis   . Pancreatitis    with pancreas divisum  . Peptic stricture of esophagus   . Prostate cancer ([place])    radiation 2011  . Pure hypercholesterolemia   . Renal insufficiency   . Sepsis due to Pseudomonas ([place])    pyelonephritis  . Urinary obstruction   . Vitamin B 12 deficiency     Patient Active Problem List   Diagnosis Date Noted  . Acute  encephalopathy 03/25/2016  . Acute on chronic pancreatitis ([place]) 03/25/2016  . Acute on chronic kidney failure ([place]) 03/23/2016  . AAA (abdominal aortic aneurysm) ([place]) 03/23/2016  . Dissection of abdominal aorta ([place])   . Acute right-sided low back pain with sciatica   . Aortic dissection ([place]) 02/19/2016  . CKD (chronic kidney disease) 04/23/2014  . Chronic abdominal pain 04/23/2014  . CAP (community acquired pneumonia) 04/22/2014  . Coronary artery disease involving native coronary artery of native heart without angina pectoris 08/05/2013  . Essential hypertension 08/05/2013  . Hyperlipidemia 08/05/2013  . Old MI (myocardial infarction) 08/05/2013  . Blunt abdominal trauma 10/26/2011  . Blunt chest trauma 10/26/2011  . Acute blood loss anemia 10/26/2011  . Chronic anemia 10/26/2011  . Fracture of iliac wing ([place]) 10/25/2011  . Multiple rib fractures 10/25/2011  . Adrenal hematoma 10/25/2011  . Pneumomediastinum ([place]) 10/25/2011    Past Surgical History:  Procedure Laterality Date  . CORONARY ANGIOPLASTY WITH STENT PLACEMENT      Current Outpatient Rx  . Order #: [id number] Class: Historical Med  . Order #: [id number] Class: Historical Med  . Order #: [id number] Class: Normal  . Order #: [id number] Class: Historical Med  . Order #: [id number] Class: Historical Med  . Order #: [id number] Class: Historical Med  . Order #: [id number] Class: Historical Med  .  Order #: [id number] Class: Historical Med  . Order #: [id number] Class: Normal  . Order #: [id number] Class: Historical Med  . Order #: [id number] Class: Historical Med  . Order #: [id number] Class: Normal  . Order #: [id number] Class: Historical Med  . Order #: [id number] Class: Normal  . Order #: [id number] Class: Historical Med    Allergies Codeine; Dilaudid [hydromorphone hcl]; and Morphine and related  Family History  Problem Relation Age of Onset  . Diabetes Mother   . Heart attack Father   . Heart attack Brother        x 2 brothers      Social History Social History   Tobacco Use  . Smoking status: Former Smoker    Types: Cigarettes    Last attempt to quit: 04/21/2016    Years since quitting: 0.8  . Smokeless tobacco: Former Network engineer Use Topics  . Alcohol use: No  . Drug use: No    Review of Systems  Level 5 caveat: Dementia.   ____________________________________________   PHYSICAL EXAM:  VITAL SIGNS: ED Triage Vitals  Enc Vitals Group     BP 03/10/2017 0916 (!) 221/98     Pulse Rate 03/02/2017 0916 65     Resp 03/10/2017 0916 14     Temp 03/08/2017 0916 98 F (36.7 C)     Temp Source 03/09/2017 0916 Oral     SpO2 02/25/2017 0910 98 %   Constitutional: Alert. Well appearing and in no acute distress. Following commands.  Eyes: Conjunctivae are normal. PERRL.  Head: Atraumatic. Nose: No congestion/rhinnorhea. Mouth/Throat: Mucous membranes are slightly dry.  Neck: No stridor. No cervical spine tenderness to palpation. Cardiovascular: Normal rate, regular rhythm. Good peripheral circulation. Grossly normal heart sounds.   Respiratory: Normal respiratory effort.  No retractions. Lungs CTAB. Gastrointestinal: Soft and nontender. No distention.  Musculoskeletal: No lower extremity tenderness nor edema. No gross deformities of extremities. Normal passive ROM of bilateral hips.  Neurologic:  Normal speech and language. Slight left ptosis. No other CN deficits appreciated 2-12. Equal strength in the upper and lower extremities bilaterally. No drift.  Skin:  Skin is warm, dry and intact. No rash noted.  ____________________________________________   LABS (all labs ordered are listed, but only abnormal results are displayed)  Labs Reviewed  CBC - Abnormal; Notable for the following components:      Result Value   RBC 3.36 (*)    Hemoglobin 10.7 (*)    HCT 32.7 (*)    All other components within normal limits  COMPREHENSIVE METABOLIC PANEL - Abnormal; Notable for the following components:   Potassium  3.1 (*)    Glucose, Bld 143 (*)    Creatinine, Ser 1.61 (*)    GFR calc non Af Amer 38 (*)    GFR calc Af Amer 44 (*)    All other components within normal limits  ETHANOL  PROTIME-INR  APTT  DIFFERENTIAL  RAPID URINE DRUG SCREEN, HOSP PERFORMED  URINALYSIS, ROUTINE W REFLEX MICROSCOPIC  I-STAT TROPONIN, ED   ____________________________________________  EKG   EKG Interpretation  Date/Time:  Sunday March 03 2017 09:14:08 EST Ventricular Rate:  69 PR Interval:    QRS Duration: 117 QT Interval:  438 QTC Calculation: 470 R Axis:   2 Text Interpretation:  Sinus rhythm Nonspecific intraventricular conduction delay Borderline repolarization abnormality No STEMI  Confirmed by Nanda Quinton [phone]) on 02/23/2017 9:29:11 AM       ____________________________________________  RADIOLOGY  Ct Head Wo Contrast  Result Date: 03/06/2017 CLINICAL DATA:  Fall in bathroom.  Fixed gaze. EXAM: CT HEAD WITHOUT CONTRAST TECHNIQUE: Contiguous axial images were obtained from the base of the skull through the vertex without intravenous contrast. COMPARISON:  10/25/2011 FINDINGS: Brain: Large hemorrhage dissecting through the left frontal lobe. Anteriorly there is more homogeneous high-density and posteriorly there is clot that is layering. Intervening low-density is likely un clotted blood. The total hemorrhagic area, with presumed interposed parenchyma, is 8 x 5 x 5 cm ( 100 cc). There is subdural extension locally with mixed density clot extending posteriorly measuring up to 13 mm where discretely seen along the mid sylvian fissure. Along the frontal pole, it is difficult to distinguish extra-axial from intra-axial blood. Midline shift measures up to 18 mm. There is edema about the hemorrhage which extends into the left temporal pole. No definite gray matter involvement/infarct the level of the temporal pole. Reportedly the patient had a fall and this may traumatic. Hemorrhagic infarct, hypertension,  hemorrhagic mass, coagulopathy, or amyloid angiopathy are additional diagnostic considerations. No entrapment. Chronic small vessel ischemic type change in the cerebral white matter. Vascular: Atherosclerotic calcification. Skull: No acute finding. Sinuses/Orbits: No traumatic findings. Other: Critical Value/emergent results were called by telephone at the time of interpretation on 03/19/2017 at 10:39 am to Dr. Nanda Quinton , who is already aware. IMPRESSION: Large left frontal parenchymal hemorrhage measuring up to 100 cc. There is subdural extension along the left cerebral convexity. Regional edema with midline shift measuring up to 18 mm. Electronically Signed   By: Monte Fantasia M.D.   On: 03/15/2017 10:41   Dg Chest Portable 1 View  Result Date: 03/18/2017 CLINICAL DATA:  ET tube placement EXAM: PORTABLE CHEST 1 VIEW COMPARISON:  03/23/2016 FINDINGS: Endotracheal tube is 4 cm above the carina. NG tube is in the stomach. Mild elevation of the right hemidiaphragm with right base atelectasis. No confluent opacity on the left. Heart is normal size. No effusions. IMPRESSION: Elevated right hemidiaphragm with right base atelectasis. Endotracheal tube 4 cm above the carina. Electronically Signed   By: Rolm Baptise M.D.   On: 03/18/2017 10:51   Dg Abd Portable 1 View  Result Date: 03/09/2017 CLINICAL DATA:  OG tube placement EXAM: PORTABLE ABDOMEN - 1 VIEW COMPARISON:  None. FINDINGS: OG tube tip is in the distal stomach. No evidence for bowel obstruction, free air organomegaly. IMPRESSION: OG tube tip in the distal stomach. Electronically Signed   By: Rolm Baptise M.D.   On: 03/02/2017 10:52    ____________________________________________   PROCEDURES  Procedure(s) performed:   Date/Time: 02/26/2017 10:33 AM Performed by: Margette Fast, MD Pre-anesthesia Checklist: Emergency Drugs available, Suction available, Patient identified and Patient being monitored Induction Type: Rapid  sequence Laryngoscope Size: Glidescope and 3 Grade View: Grade I Tube size: 7.5 mm Number of attempts: 1 Airway Equipment and Method: Video-laryngoscopy Placement Confirmation: ETT inserted through vocal cords under direct vision,  CO2 detector and Breath sounds checked- equal and bilateral Secured at: 24 cm Dental Injury: Teeth and Oropharynx as per pre-operative assessment      .Critical Care Performed by: Margette Fast, MD Authorized by: Margette Fast, MD   Critical care provider statement:    Critical care time (minutes):  50   Critical care time was exclusive of:  Teaching time and separately billable procedures and treating other patients   Critical care was necessary to treat or prevent imminent or life-threatening deterioration of the following conditions:  CNS failure or compromise   Critical care was  time spent personally by me on the following activities:  Blood draw for specimens, development of treatment plan with patient or surrogate, discussions with consultants, evaluation of patient's response to treatment, examination of patient, obtaining history from patient or surrogate, ordering and performing treatments and interventions, ordering and review of laboratory studies, ordering and review of radiographic studies, pulse oximetry, re-evaluation of patient's condition, ventilator management and review of old charts   I assumed direction of critical care for this patient from another provider in my specialty: no       ____________________________________________   INITIAL IMPRESSION / ASSESSMENT AND PLAN / ED COURSE  Pertinent labs & imaging results that were available during my care of the patient were reviewed by me and considered in my medical decision making (see chart for details).  Patient presents to the emergency department for evaluation after a fall in the bathroom with report of stroke symptoms on scene with EMS.  They describe fairly dense weakness of  the left face, arm, leg.  Symptoms have mostly resolved on my evaluation.  He may have a slight left ptosis but otherwise has equal strength in the upper and lower extremities.  No speech changes or vision disturbance.  Exam is slightly complicated by the patient's underlying early dementia symptoms.  Unclear what led to the fall in the bathroom but with exam from EMS suspicious that this was an acute stroke.  Lower suspicion for syncope/seizure.  No findings to suggest LVO.  Plan for CT imaging of the head along with stroke/syncope workup.   10:10 AM Patient mental status declining. Called to CT for evaluation. He is no longer speaking but managing airway at this time. He is already on the CT table so will proceed with scan and go back to speak with wife in more detail regarding goals of care plan.   10:21 AM Patient returned from [place].  I accompanied him over there and saw a large left frontal bleed.  The patient's clinical status is deteriorating rapidly.  He is no longer speaking and concerned about his ability to protect his own airway at this point.  I had a discussion with his wife regarding his DNR status from March listed in [place].  She states that they never really talked about it very much but that she thinks he would want to be intubated and have chest compressions/ACLS if there was a chance of recovery or treatment.  At this time she would like him to be considered full code.   10:35 AM Spoke with neurosurgery PA.  They will be down to evaluate the patient and discussed with family the best course of action.   NSG plans to take to the OR for craniotomy.  ____________________________________________  FINAL CLINICAL IMPRESSION(S) / ED DIAGNOSES  Final diagnoses:  Nontraumatic cortical hemorrhage of left cerebral hemisphere (HCC)  SDH (subdural hematoma) (HCC)     MEDICATIONS GIVEN DURING THIS VISIT:  Medications  nicardipine (CARDENE) 20mg  in 0.86% saline 278ml IV infusion (0.1  mg/ml) (12.5 mg/hr Intravenous Rate/Dose Change 03/19/2017 1119)  propofol (DIPRIVAN) 1000 MG/100ML infusion (0 mcg/kg/min Intravenous Hold 03/18/2017 1101)  ondansetron (ZOFRAN) injection 4 mg (4 mg Intravenous Given 02/25/2017 0938)  levETIRAcetam (KEPPRA) 1,500 mg in sodium chloride 0.9 % 100 mL IVPB (0 mg Intravenous Stopped 02/28/2017 1100)  succinylcholine (ANECTINE) injection 100 mg (100 mg Intravenous Given 02/27/2017 1029)  succinylcholine (ANECTINE) injection (100 mg Intravenous Given 03/20/2017 1029)  propofol (DIPRIVAN) 10 mg/mL bolus/IV push ( Intravenous Canceled Entry 03/02/2017  1045)    Note:  This document was prepared using Dragon voice recognition software and may include unintentional dictation errors.  Nanda Quinton, MD Emergency Medicine   Long, [facility], MD 03/18/2017 [phone]

## 2017-03-03 NOTE — ED Notes (Signed)
While in CT with patient, patient noted to become expressive/receptive aphasia, right gaze, vomiting at current time. MD Long made aware and came to CT to see patient.

## 2017-03-03 NOTE — Transfer of Care (Signed)
Immediate Anesthesia Transfer of Care Note  [name]  Procedure(s) Performed: CRANIOTOMY HEMATOMA EVACUATION SUBDURAL/LEFT FRONTAL (Left Head)  Patient Location: NICU  Anesthesia Type:General  Level of Consciousness: Patient remains intubated per anesthesia plan  Airway & Oxygen Therapy: Patient remains intubated per anesthesia plan  Post-op Assessment: Report given to RN and Post -op Vital signs reviewed and stable  Post vital signs: Reviewed and stable  Last Vitals:  Vitals:   03/05/2017 1200 03/19/2017 1440  BP: (!) 108/53   Pulse: 78 83  Resp:  16  Temp:  (!) 34 C  SpO2: 99% 100%    Last Pain:  Vitals:   03/21/2017 0916  TempSrc: Oral         Complications: No apparent anesthesia complications

## 2017-03-03 NOTE — Brief Op Note (Signed)
03/10/2017  1:55 PM  PATIENT:  Paul Odonnell  84 y.o. male  PRE-OPERATIVE DIAGNOSIS:  SUBDURAL HEMATOMA  POST-OPERATIVE DIAGNOSIS:  SUBDURAL HEMATOMA  PROCEDURE:  Procedure(s): CRANIOTOMY HEMATOMA EVACUATION SUBDURAL/LEFT FRONTAL (Left) And evacuation of left frontal intraparenchymal hematoma SURGEON:  Surgeon(s) and Role:    * Scottie Stanish, Kevan Ny, MD - Primary  PHYSICIAN ASSISTANT: Ferne Reus, PA-C  ASSISTANTS: none   ANESTHESIA:   general  EBL:  200 mL   BLOOD ADMINISTERED:none  DRAINS: none   LOCAL MEDICATIONS USED:  MARCAINE    and LIDOCAINE   SPECIMEN:  No Specimen  DISPOSITION OF SPECIMEN:  N/A  COUNTS:  YES  TOURNIQUET:  * No tourniquets in log *  DICTATION: .Dragon Dictation  PLAN OF CARE: Admit to inpatient   PATIENT DISPOSITION:  ICU - intubated and hemodynamically stable.   Delay start of Pharmacological VTE agent (>24hrs) due to surgical blood loss or risk of bleeding: yes

## 2017-03-03 NOTE — Progress Notes (Signed)
eLink Physician-Brief Progress Note Patient Name: Paul Odonnell DOB: 1930/04/26 MRN: [id number]   Date of Service  03/02/2017  HPI/Events of Note  Hyperglycemia - Blood glucose = 252.   eICU Interventions  Will order: 1. Q 4 hour sensitive Novolog SSI.      Intervention Category Major Interventions: Hyperglycemia - active titration of insulin therapy  Hollis Tuller [place] 03/01/2017, 9:01 PM

## 2017-03-03 NOTE — Progress Notes (Signed)
PT transported from ED C20 to OR on ventilator. Pt stable throughout with no complications. VS within normal limits. CRNA manually ventilated pt upon arrival to OR. Pt's ventilator taken to 4N19 for pt's return from OR. Report given to RT at bedside

## 2017-03-03 NOTE — Progress Notes (Addendum)
CDS referral made. Call back if any plans to do brain death testing or with cardiac time of death. Referral number : [phone]

## 2017-03-04 ENCOUNTER — Inpatient Hospital Stay (HOSPITAL_COMMUNITY): Payer: Medicare Other

## 2017-03-04 LAB — BASIC METABOLIC PANEL
Anion gap: 13 (ref 5–15)
BUN: 20 mg/dL (ref 6–20)
CALCIUM: 8.1 mg/dL — AB (ref 8.9–10.3)
CO2: 20 mmol/L — AB (ref 22–32)
CREATININE: 1.78 mg/dL — AB (ref 0.61–1.24)
Chloride: 109 mmol/L (ref 101–111)
GFR, EST AFRICAN AMERICAN: 39 mL/min — AB (ref >60)
GFR, EST NON AFRICAN AMERICAN: 33 mL/min — AB (ref >60)
Glucose, Bld: 239 mg/dL — ABNORMAL HIGH (ref 65–99)
Potassium: 4.1 mmol/L (ref 3.5–5.1)
SODIUM: 142 mmol/L (ref 135–145)

## 2017-03-04 LAB — GLUCOSE, CAPILLARY
GLUCOSE-CAPILLARY: 271 mg/dL — AB (ref 65–99)
Glucose-Capillary: 221 mg/dL — ABNORMAL HIGH (ref 65–99)
Glucose-Capillary: 168 mg/dL — ABNORMAL HIGH (ref 65–99)

## 2017-03-04 LAB — CBC
HCT: 26.3 % — ABNORMAL LOW (ref 39.0–52.0)
Hemoglobin: 8.6 g/dL — ABNORMAL LOW (ref 13.0–17.0)
MCH: 31.5 pg (ref 26.0–34.0)
MCHC: 32.7 g/dL (ref 30.0–36.0)
MCV: 96.3 fL (ref 78.0–100.0)
PLATELETS: 165 10*3/uL (ref 150–400)
RBC: 2.73 MIL/uL — AB (ref 4.22–5.81)
RDW: 13.7 % (ref 11.5–15.5)
WBC: 11.3 10*3/uL — ABNORMAL HIGH (ref 4.0–10.5)

## 2017-03-04 MED ORDER — FENTANYL 2500MCG IN NS 250ML (10MCG/ML) PREMIX INFUSION
100.0000 ug/h | INTRAVENOUS | Status: DC
  Administered 2017-03-04 – 2017-03-06 (×4): 100 ug/h via INTRAVENOUS
  Filled 2017-03-04 (×4): qty 250

## 2017-03-04 MED ORDER — LORAZEPAM 2 MG/ML IJ SOLN
2.0000 mg | INTRAMUSCULAR | Status: DC
  Administered 2017-03-05 – 2017-03-06 (×8): 2 mg via INTRAVENOUS
  Filled 2017-03-04 (×9): qty 1

## 2017-03-04 NOTE — Progress Notes (Signed)
Pt seen and examined. No issues overnight.  EXAM: Temp:  [92.8 F (33.8 C)-101.3 F (38.5 C)] 101.3 F (38.5 C) (02/11 0700) Pulse Rate:  [64-120] 115 (02/11 0700) Resp:  [14-27] 22 (02/11 0700) BP: (104-262)/(53-99) 131/56 (02/11 0700) SpO2:  [95 %-100 %] 99 % (02/11 0700) Arterial Line BP: (118-144)/(46-63) 130/51 (02/10 2315) FiO2 (%):  [40 %-100 %] 40 % (02/11 0400) Weight:  [72.4 kg (159 lb 9.8 oz)] 72.4 kg (159 lb 9.8 oz) (02/10 1440) Intake/Output      02/10 0701 - 02/11 0700 02/11 0701 - 02/12 0700   P.O. 0    I.V. (mL/kg) 3556.7 (49.1)    IV Piggyback 457.5    Total Intake(mL/kg) 4014.2 (55.4)    Urine (mL/kg/hr) 1310    Emesis/NG output 100    Blood 200    Total Output 1610    Net +2404.2          Intubated and sedated.  LABS: Lab Results  Component Value Date   CREATININE 1.78 (H) 03/04/2017   BUN 20 03/04/2017   NA 142 03/04/2017   K 4.1 03/04/2017   CL 109 03/04/2017   CO2 20 (L) 03/04/2017   Lab Results  Component Value Date   WBC 11.3 (H) 03/04/2017   HGB 8.6 (L) 03/04/2017   HCT 26.3 (L) 03/04/2017   MCV 96.3 03/04/2017   PLT 165 03/04/2017    IMAGING: I have independently reviewed his CT.  There is a large residual intraparenchymal hemorrhage with significantly worsened mass effect.   IMPRESSION: - 84 y.o. male with left subdural and intraparenchymal hemorrhage.  There is no hope for meaningful survival.  I have discussed this with his wife who is at the bedside. She understands.  PLAN: - Palliative care consulted - Supportive care for now

## 2017-03-04 NOTE — Procedures (Signed)
Extubation Procedure Note  Patient Details:   Name: Paul Odonnell DOB: April 27, 1930 MRN: [id number]   Airway Documentation:   Pt terminal extubation at 12:10 p.m with RN present.   Evaluation  O2 sats: currently acceptable Complications: No apparent complications Patient did tolerate procedure well. Bilateral Breath Sounds: Diminished, Clear   No  Elwin Mocha 03/04/2017, 6:00 PM

## 2017-03-04 NOTE — Progress Notes (Signed)
Patient comfort care at this time. To be transferred to 6N18, report called to RN. Patient resting comfortably in bed with no distress. Wife at bedside and aware of transfer.

## 2017-03-04 NOTE — Progress Notes (Signed)
Palliative consult received. Called patient's spouse for consult and she was in consult with primary team and said they were "making patient comfortable" and Palliative consult was not needed.   Please feel free to reconsult if further Palliative assistance is needed.   Mariana Kaufman, AGNP-C Palliative Medicine  Please call Palliative Medicine team phone with any questions [phone]. For individual providers please see AMION.

## 2017-03-04 NOTE — [place] Notes (Signed)
PULMONARY / CRITICAL CARE MEDICINE   Name: Paul Odonnell MRN: [id number] DOB: 1930-05-20    ADMISSION DATE:  03/02/2017    CHIEF COMPLAINT:  Left sided weakness and decreased LOC  HISTORY OF PRESENT ILLNESS:        84 yo male former smoker found at home in bathroom and unable to get up.  Noted to have Lt sided weakness.  He progressed to aphasia and started vomiting.  BP in ER was 221/98.  He required intubation for airway protection.  CT head showed large ICH with subdural extension.  The family was agreeable to short term vent support, but otherwise patient DNR.  He went for craniotomy with evacuation of hematoma. Exam has not improved post op, he is unresponsive on no sedation, and follow up CT shows worsening shift. After discussion with Neurosurgery, family has opted for comfort measures, an impression Paul Odonnell has confirmed with me.    PAST MEDICAL HISTORY :  He  has a past medical history of AAA (abdominal aortic aneurysm) ([place]), Anemia, Asthma, BPH (benign prostatic hyperplasia), CAD (coronary artery disease), Depression, Diverticulosis, DM (diabetes mellitus) ([place]), Gastritis, GERD (gastroesophageal reflux disease), HH (hiatus hernia), HTN (hypertension), Hypothyroidism, IBS (irritable bowel syndrome), MI (myocardial infarction) ([place]), Osteoarthritis, Pancreatitis, Peptic stricture of esophagus, Prostate cancer ([place]), Pure hypercholesterolemia, Renal insufficiency, Sepsis due to Pseudomonas ([place]), Urinary obstruction, and Vitamin B 12 deficiency.  PAST SURGICAL HISTORY: He  has a past surgical history that includes Coronary angioplasty with stent.  Allergies  Allergen Reactions  . Codeine Other (See Comments)    Pancreatic problems   . Dilaudid [Hydromorphone Hcl] Other (See Comments)    Affects patient's thinking  . Morphine And Related Other (See Comments)    Affects patient's thinking    No current facility-administered medications on file prior to encounter.     Current Outpatient Medications on File Prior to Encounter  Medication Sig  . Ascorbic Acid (VITAMIN C) 1000 MG tablet Take 1,000 mg by mouth daily.  Marland Kitchen aspirin 325 MG tablet Take 325 mg by mouth daily.  Marland Kitchen atorvastatin (LIPITOR) 40 MG tablet take 1/2 tablet by mouth once daily  . b complex vitamins tablet Take 1 tablet by mouth daily.  . Cholecalciferol (VITAMIN D PO) Take 1 tablet by mouth daily.  Marland Kitchen CREON 36000 units CPEP capsule Take 36,000 Units by mouth 3 (three) times daily.  Marland Kitchen docusate sodium (COLACE) 100 MG capsule Take 200 mg by mouth 2 (two) times daily.  Marland Kitchen escitalopram (LEXAPRO) 10 MG tablet Take 10 mg by mouth daily.  . feeding supplement (BOOST / RESOURCE BREEZE) LIQD Take 1 Container by mouth 3 (three) times daily between meals.  . gabapentin (NEURONTIN) 100 MG capsule Take 100 mg by mouth 2 (two) times daily.  Marland Kitchen levothyroxine (SYNTHROID, LEVOTHROID) 50 MCG tablet Take 50 mcg by mouth daily before breakfast.   . mirtazapine (REMERON) 15 MG tablet Take 1 tablet (15 mg total) by mouth at bedtime.  . OXYCONTIN 20 MG 12 hr tablet Take 20 mg by mouth every morning.   Marland Kitchen QUEtiapine (SEROQUEL) 25 MG tablet Take 1 tablet (25 mg total) by mouth at bedtime.  . vitamin E 100 UNIT capsule Take 100 Units by mouth daily.    FAMILY HISTORY:  His indicated that his mother is deceased. He indicated that his father is deceased. He indicated that the status of his brother is unknown. He indicated that his maternal grandmother is deceased. He indicated that his maternal grandfather is deceased.  He indicated that his paternal grandmother is deceased. He indicated that his paternal grandfather is deceased.   SOCIAL HISTORY: He  reports that he quit smoking about 10 months ago. His smoking use included cigarettes. He has quit using smokeless tobacco. He reports that he does not drink alcohol or use drugs.  REVIEW OF SYSTEMS:   unobtainable  SUBJECTIVE:  As above  VITAL SIGNS: BP (!) 134/58    Pulse (!) 109   Temp (!) 101.3 F (38.5 C)   Resp 16   Ht 5\' 10"  (1.778 m)   Wt 159 lb 9.8 oz (72.4 kg)   SpO2 100%   BMI 22.90 kg/m   HEMODYNAMICS:    VENTILATOR SETTINGS: Vent Mode: PRVC FiO2 (%):  [40 %-100 %] 40 % Set Rate:  [16 bmp] 16 bmp Vt Set:  [600 mL] 600 mL PEEP:  [5 cmH20] 5 cmH20 Plateau Pressure:  [14 cmH20-17 cmH20] 16 cmH20  INTAKE / OUTPUT: I/O last 3 completed shifts: In: 4014.2 [I.V.:3556.7; IV Piggyback:457.5] Out: 4627 [Urine:1310; Emesis/NG output:100; Blood:200]  PHYSICAL EXAMINATION: General:  Orally intubated, mechanically ventilated, on no sedation, unresponsive Neuro:  No response to voice or sternal rub. Pupis equal at 48mm. EOM's absent by doll's. Breathing above set vent rate Cardiovascular:S1 and S2 regular with a 3/6 systolic murmur Lungs: unlabored, symmetric air movement, no wheezes Abdomen: soft, without organomegaly, masses or  tenderness  LABS:  BMET Recent Labs  Lab 02/28/2017 0928 03/06/2017 1619 03/04/17 0425  NA 143 142 142  K 3.1* 4.1 4.1  CL 107 108 109  CO2 23 18* 20*  BUN 12 14 20   CREATININE 1.61* 1.73* 1.78*  GLUCOSE 143* 252* 239*    Electrolytes Recent Labs  Lab 03/18/2017 0928 03/16/2017 1619 03/04/17 0425  CALCIUM 9.0 8.4* 8.1*    CBC Recent Labs  Lab 03/21/2017 0928 03/20/2017 1619 03/04/17 0425  WBC 6.1 7.8 11.3*  HGB 10.7* 8.6* 8.6*  HCT 32.7* 25.3* 26.3*  PLT 168 133* 165    Coag's Recent Labs  Lab 03/21/2017 0928  APTT 25  INR 1.10    Sepsis Markers No results for input(s): LATICACIDVEN, PROCALCITON, O2SATVEN in the last 168 hours.  ABG Recent Labs  Lab 02/26/2017 1137 03/02/2017 1527  PHART 7.334* 7.336*  PCO2ART 49.1* 37.4  PO2ART 375.0* 163.0*    Liver Enzymes Recent Labs  Lab 03/10/2017 0928  AST 30  ALT 19  ALKPHOS 118  BILITOT 0.5  ALBUMIN 3.5    Cardiac Enzymes No results for input(s): TROPONINI, PROBNP in the last 168 hours.  Glucose Recent Labs  Lab 02/22/2017 2016  03/04/17 0043 03/04/17 0409 03/04/17 0744  GLUCAP 310* 271* 221* 168*    Imaging Ct Head Wo Contrast  Result Date: 03/04/2017 CLINICAL DATA:  84 y/o M; follow-up of large intracranial hemorrhage with subdural extension post craniotomy. EXAM: CT HEAD WITHOUT CONTRAST TECHNIQUE: Contiguous axial images were obtained from the base of the skull through the vertex without intravenous contrast. COMPARISON:  02/25/2017 CT head. FINDINGS: Brain: Interval left frontal craniotomy with evacuation of left frontal lobe hematoma. There is a large volume of residual blood products within the of evacuation cavity, new moderate volume of pneumocephalus, and residual mixed attenuation extra-axial collection over the left cerebral convexity measuring up to 9 mm in thickness. There is increased hypoattenuation at the cavity margins which may represent associated edema or infarction. Additionally, there is extensive edema throughout the left anterior frontal lobe, basal ganglia, and anterior temporal lobe. Interval increase  in mass effect with persistent left-to-right subfalcine herniation, persistent left uncal herniation, 21 mm left-to-right midline shift (series 3, image 19), previously 18 mm measured in similar fashion. Near complete effacement of the left lateral ventricle an interval increase in size of the right lateral ventricle compatible with developing entrapment. Additionally, there is hemorrhage within the atria of the ventricles and subarachnoid hemorrhage that is increased within the right sylvian fissure, probably due to redistribution of blood products. There is partial effacement of the quadrigeminal plate cistern and suprasellar cistern. Vascular: No hyperdense vessel or unexpected calcification. Skull: Postsurgical changes related to left frontal craniotomy with air and edema in the overlying scalp. Sinuses/Orbits: No acute finding. Sclerosis and opacification of right mastoid air cells compatible sequelae  of chronic otomastoiditis. Other: None. IMPRESSION: 1. Interval evacuation of left frontal hematoma and postsurgical changes related to left craniotomy. 2. Large volume of residual hemorrhage in evacuation cavity, increased pericavity edema and/or infarction, new moderate volume of pneumocephalus, and grossly stable extra-axial collection over the left cerebral convexity. 3. Increased mass effect with 21 mm left-to-right midline shift, previously 18 mm. 4. Mild increased size of right lateral ventricle, probable developing entrapment. 5. Increased intraventricular and right sylvian fissure subarachnoid hemorrhage, likely related to redistribution. These results will be called to the ordering clinician or representative by the Radiologist Assistant, and communication documented in the PACS or zVision Dashboard. Electronically Signed   By: Kristine Garbe M.D.   On: 03/04/2017 05:34   Ct Head Wo Contrast  Result Date: 03/20/2017 CLINICAL DATA:  Fall in bathroom.  Fixed gaze. EXAM: CT HEAD WITHOUT CONTRAST TECHNIQUE: Contiguous axial images were obtained from the base of the skull through the vertex without intravenous contrast. COMPARISON:  10/25/2011 FINDINGS: Brain: Large hemorrhage dissecting through the left frontal lobe. Anteriorly there is more homogeneous high-density and posteriorly there is clot that is layering. Intervening low-density is likely un clotted blood. The total hemorrhagic area, with presumed interposed parenchyma, is 8 x 5 x 5 cm ( 100 cc). There is subdural extension locally with mixed density clot extending posteriorly measuring up to 13 mm where discretely seen along the mid sylvian fissure. Along the frontal pole, it is difficult to distinguish extra-axial from intra-axial blood. Midline shift measures up to 18 mm. There is edema about the hemorrhage which extends into the left temporal pole. No definite Jonell Krontz matter involvement/infarct the level of the temporal pole. Reportedly  the patient had a fall and this may traumatic. Hemorrhagic infarct, hypertension, hemorrhagic mass, coagulopathy, or amyloid angiopathy are additional diagnostic considerations. No entrapment. Chronic small vessel ischemic type change in the cerebral white matter. Vascular: Atherosclerotic calcification. Skull: No acute finding. Sinuses/Orbits: No traumatic findings. Other: Critical Value/emergent results were called by telephone at the time of interpretation on 03/16/2017 at 10:39 am to Dr. Nanda Quinton , who is already aware. IMPRESSION: Large left frontal parenchymal hemorrhage measuring up to 100 cc. There is subdural extension along the left cerebral convexity. Regional edema with midline shift measuring up to 18 mm. Electronically Signed   By: Monte Fantasia M.D.   On: 02/28/2017 10:41   Dg Chest Port 1 View  Result Date: 03/01/2017 CLINICAL DATA:  Tube placement. EXAM: PORTABLE CHEST 1 VIEW COMPARISON:  One-view chest x-ray from the same day at 10:25 a.m. FINDINGS: Defibrillator pads are now in place. Heart size is normal. Endotracheal tube is stable at 3.5 cm above the carina. The NG tube courses off the inferior border the film. No other lines  are present. The lungs are clear. There is no edema or effusion. Elevation of the right hemidiaphragm remains. IMPRESSION: 1. Endotracheal tube is stable. 2. NG tube courses off the inferior border the film. 3. Stable elevation of the right hemidiaphragm. 4. Defibrillator pads are now in place. Electronically Signed   By: San Morelle M.D.   On: 03/13/2017 17:00   Dg Chest Portable 1 View  Result Date: 02/27/2017 CLINICAL DATA:  ET tube placement EXAM: PORTABLE CHEST 1 VIEW COMPARISON:  03/23/2016 FINDINGS: Endotracheal tube is 4 cm above the carina. NG tube is in the stomach. Mild elevation of the right hemidiaphragm with right base atelectasis. No confluent opacity on the left. Heart is normal size. No effusions. IMPRESSION: Elevated right  hemidiaphragm with right base atelectasis. Endotracheal tube 4 cm above the carina. Electronically Signed   By: Rolm Baptise M.D.   On: 03/08/2017 10:51   Dg Abd Portable 1 View  Result Date: 03/16/2017 CLINICAL DATA:  OG tube placement EXAM: PORTABLE ABDOMEN - 1 VIEW COMPARISON:  None. FINDINGS: OG tube tip is in the distal stomach. No evidence for bowel obstruction, free air organomegaly. IMPRESSION: OG tube tip in the distal stomach. Electronically Signed   By: Rolm Baptise M.D.   On: 03/20/2017 10:52     STUDIES:  Repeat CT as noted  DISCUSSION: Catastrophic ICH with declining neurologic status even after surgical decompression. Family has opted for comfort care, and after discussion with them it is clear that this is a decision thaey are comfortable with. Appropriate orders have been entered for comfort care.  Greater than 32 minutes was spent in the care of this patient today  Lars Masson, MD Pulmonary and [place] Pager: [phone]  03/04/2017, 9:56 AM

## 2017-03-04 NOTE — Plan of Care (Signed)
Patient with  no significant changes in neurological status overnight. Patient now GCS of 4, abnormal extension to painful stimulus in all four extremities. Patient now with fever and tachycardia, MD aware. Repeat head CT worse and Dr. Arnoldo Morale made aware. Will continue to monitor.

## 2017-03-04 NOTE — [place] Notes (Signed)
CCM paged in regards to order for tylenol for fever.

## 2017-03-05 ENCOUNTER — Encounter (HOSPITAL_COMMUNITY): Payer: Self-pay | Admitting: Neurological Surgery

## 2017-03-05 MED FILL — Thrombin For Soln 5000 Unit: CUTANEOUS | Qty: 5000 | Status: AC

## 2017-03-05 MED FILL — Thrombin For Soln 20000 Unit: CUTANEOUS | Qty: 1 | Status: AC

## 2017-03-05 MED FILL — Gelatin Absorbable MT Powder: OROMUCOSAL | Qty: 1 | Status: AC

## 2017-03-05 NOTE — [place] Notes (Signed)
Patient arrived to  [phone]. Unresponsive,comfort care, resting comfortably in bed, in no distress. Wife at bedside.

## 2017-03-05 NOTE — [place] Notes (Signed)
Nutrition Brief Note  Chart reviewed. Pt transitioned to comfort care.  No nutrition interventions warranted at this time.  Please consult as needed.   [place], [place], [place] Pager [phone] After Hours Pager

## 2017-03-05 NOTE — Op Note (Signed)
03/04/2017  1:37 PM  PATIENT:  Paul Odonnell  84 y.o. male  PRE-OPERATIVE DIAGNOSIS:  Traumatic left frontal subdural and intraparenchymal hematoma  POST-OPERATIVE DIAGNOSIS:  Same  PROCEDURE:  Left frontal craniotomy for evacuation of subdural and intraparenchymal hematoma; microsurgical technique requiring operating microscope  SURGEON:  Aldean Ast, MD  ASSISTANTS: Ferne Reus, PA-C  ANESTHESIA:   General  DRAINS: None   SPECIMEN:  None  INDICATION FOR PROCEDURE: 84 year old male who is comatose after a fall.  I offered the above operation but cautioned that I expected him to have a poor outcome.  The patient's family understood the risks, benefits, and alternatives and potential outcomes and wished to proceed.  PROCEDURE DETAILS: The patient was transferred to the operating table.  The left frontal area was clipped of hair.  He was fixated in [place] pins.  The skin was wiped with alcohol and lidocaine and marcaine with epinephrine were injected along the planned incision.  The patient was prepped and draped in the usual sterile fashion.  A curvilinear incision was made.  Raney clips were applied.  The scalp flap was elevated separately from the temporalis muscle.  The temporalis was incised to prepare for the craniotomy and allow a muscle cuff to remain attached to the bone flap.   Four burr holes were drilled.  Dura was stripped from the inner table of the skull.  The craniotomy was made connecting the burr holes.  The crainotomy flap was elevated.  The dura was opened in a C-shaped fashion.  The subdural hematoma was readily apparent. This was irrigated away.  The underlying brain was found to be contused and edematous.  A corticotomy was performed and hematoma was encountered.  Hematoma was evacuated in all directions until contused brain was encountered.  The microscope was the employed to provide light and magnification.  I used bipolar cautery to obtain hemostasis.   I also used topical hemostatic agents. I would estimate that approximately 50% of the left frontal lobe was resected while evacuating hematoma.  There was ample space and I did not think duraplasty was necessary.  Hemostasis was obtained.  I irrigated with bacitracin saline.  The dura was closed with running nurolon suture.  The skull flap was positioned and secured with titanium plates.  The temporalis was reapproximated with vicryl sutures.  The galea was closed with vicryl sutures.  The skin was closed with staples.  A sterile dressing was applied and Mayfield pins were removed.  PATIENT DISPOSITION:  ICU - intubated and hemodynamically stable.   Delay start of Pharmacological VTE agent (>24hrs) due to surgical blood loss or risk of bleeding:  yes

## 2017-03-06 NOTE — [place] Notes (Signed)
Visited with family Patient comatose All questions answered

## 2017-03-22 NOTE — [place] Notes (Signed)
Called by family to room,patient noted with no pulse,respiration and mottled skin.Patient expired at 0100 pronounced by 2 RN. Attending doctor notified. Referral made to  [facility]. Family took belonging. Wedding ring left on patient's finger,asked wife to take it but she wants it to remain with the body. Body preparation done. Patient placement notified and brought body to the morgue.

## 2017-03-22 DEATH — deceased

## 2017-04-22 NOTE — Discharge Summary (Signed)
  Physician Discharge Summary  Patient ID: Paul Odonnell MRN: [id number] DOB/AGE: 1930-10-27 84 y.o.  Admit date: 03/20/2017 Discharge date: 03/31/2017  Admission Diagnoses:  Intracranial hemorrhage  Hospital Course:  [name] is a 84 y.o. male who was brought to ER 02/27/2017 as code stroke. He underwent emergent head CT which revealed large intraparenchymal hemorrhage with subdural extension. He underwent emergent surgery as below. Unfortunately, despite surgical intervention, there was no improvement in neurological function and in fact his CT head the following day showed large residual hemorrhage with significant worsened mass effect. After discussion with wife, family and friends, regarding his grim prognosis, they elected to terminally extubate. Patient was extubated on 2/11 at 1210pm. He expired Feb 14th 0100.  Treatments: Surgery Left frontal craniotomy for evacuation of subdural and intraparenchymal hematoma; microsurgical technique requiring operating microscope  Disposition: 20-Expired   Signed: Nevae Pinnix J 03/31/2017, 7:51 PM

## 2017-06-05 IMAGING — DX DG CHEST 2V
2 series · 2 of 2 positions shown · non-contrast
Comparison: None.

CLINICAL DATA: Chest pain

EXAM:
CHEST  2 VIEW

[chest lat]
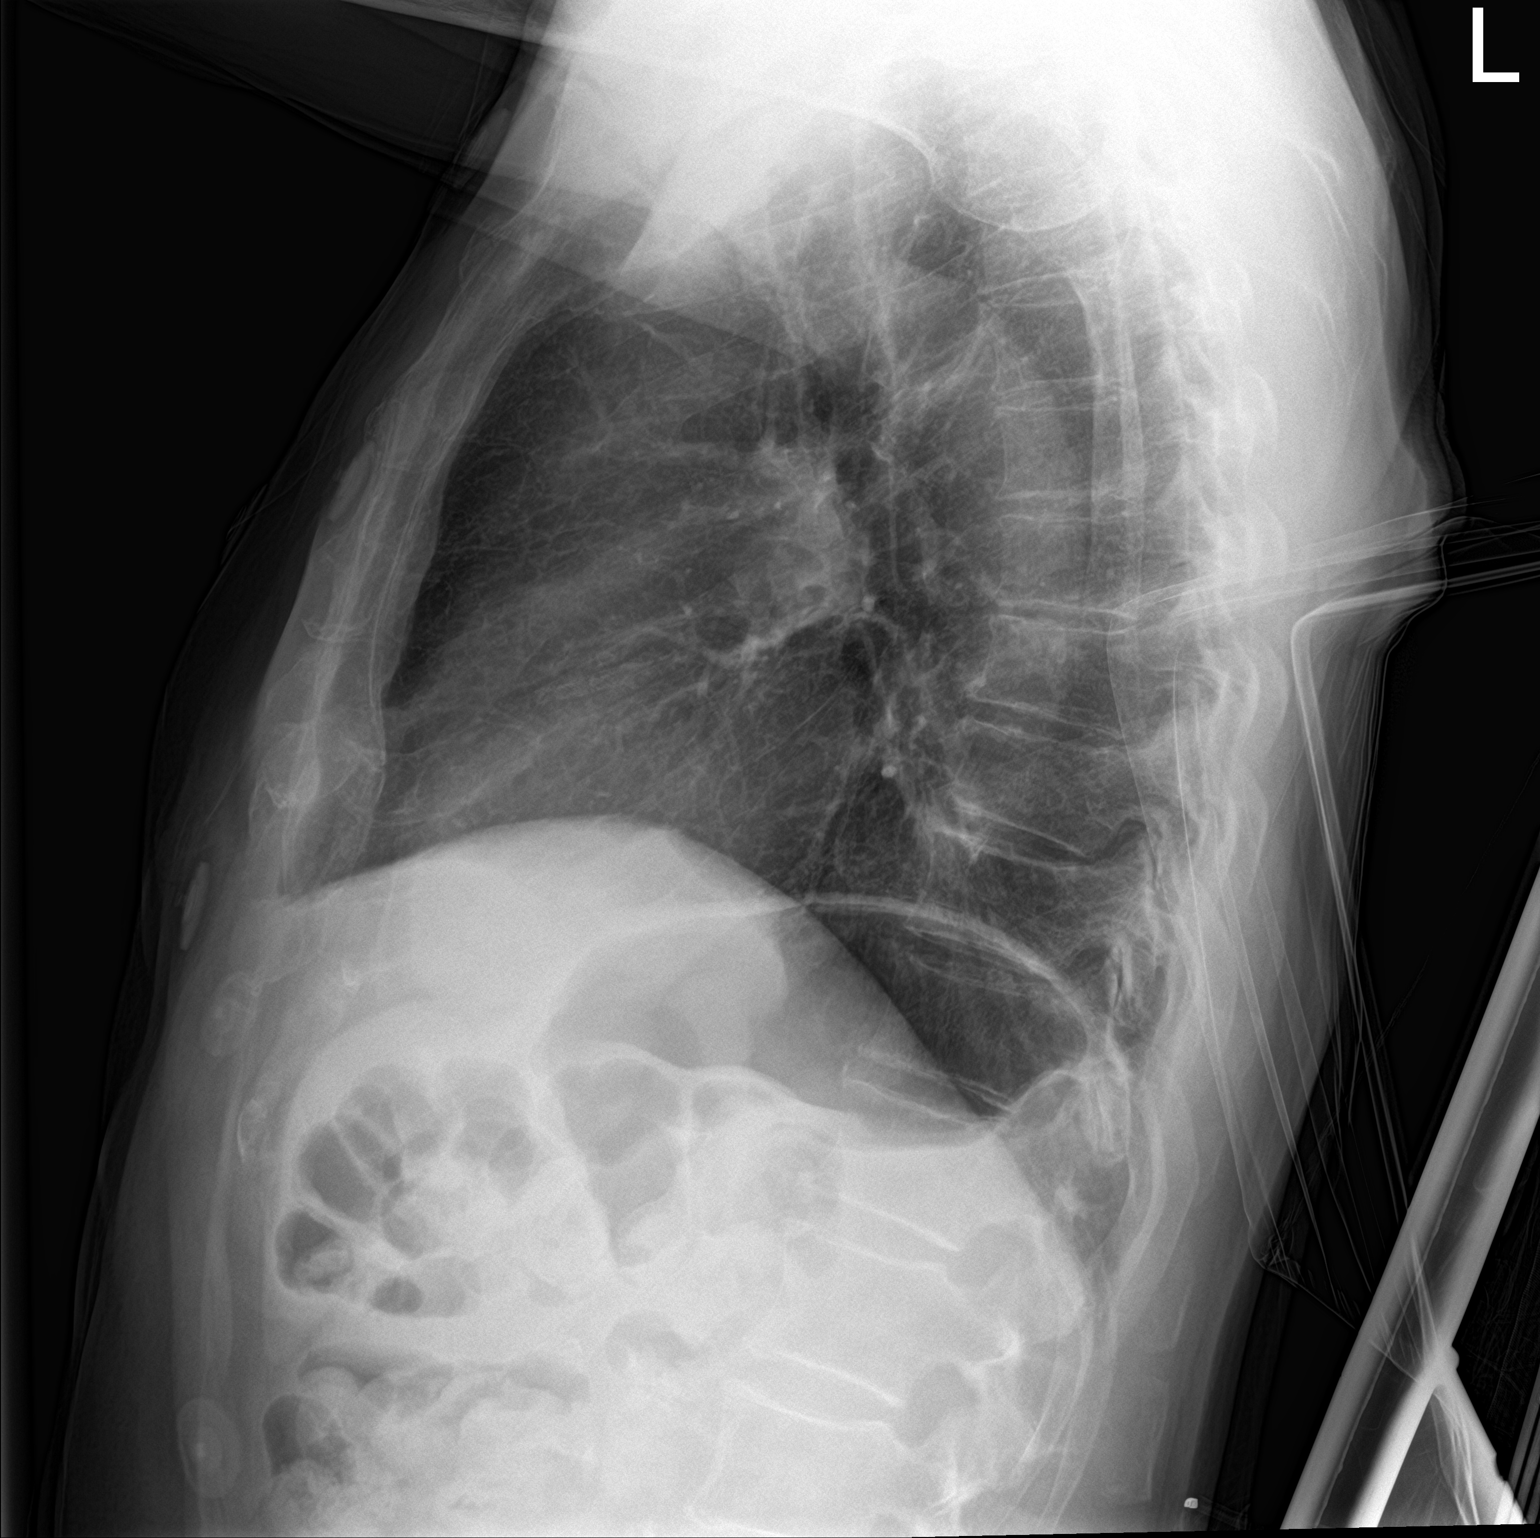

[chest ap]
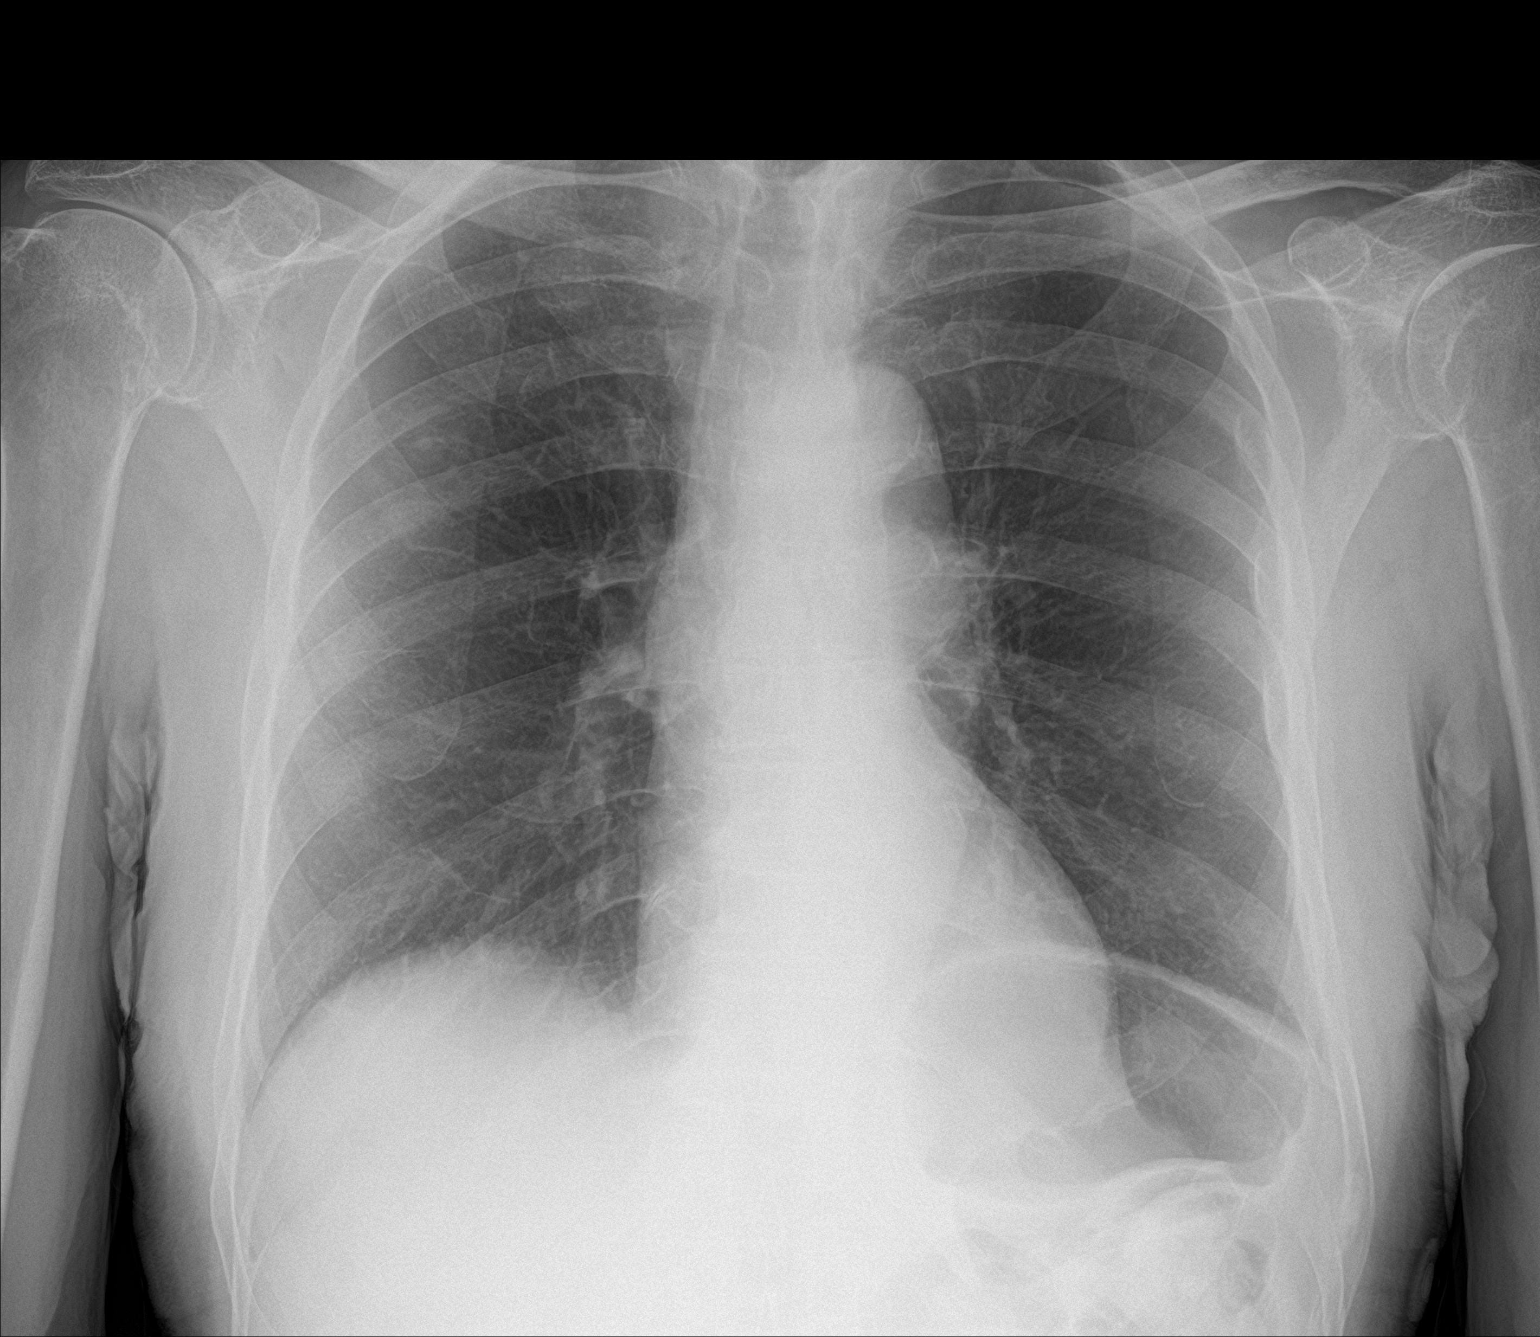

[2 of 2 positions shown; findings below may reference images not displayed]

FINDINGS: The heart size and mediastinal contours are within normal limits.
Both lungs are clear. The visualized skeletal structures are
unremarkable.
IMPRESSION: No active cardiopulmonary disease.

## 2018-06-27 ENCOUNTER — Other Ambulatory Visit (HOSPITAL_COMMUNITY): Payer: Medicare Other

## 2018-06-27 ENCOUNTER — Ambulatory Visit: Payer: Medicare Other | Admitting: Vascular Surgery
